# Patient Record
Sex: Female | Born: 1990 | Race: Black or African American | Hispanic: No | Marital: Single | Smoking: Never smoker
Health system: Southern US, Community
[De-identification: ages and names within clinical notes are randomized; demographics above are authoritative.]

## PROBLEM LIST (undated history)

## (undated) DIAGNOSIS — R079 Chest pain, unspecified: Secondary | ICD-10-CM

## (undated) DIAGNOSIS — R0602 Shortness of breath: Secondary | ICD-10-CM

## (undated) DIAGNOSIS — E119 Type 2 diabetes mellitus without complications: Secondary | ICD-10-CM

## (undated) DIAGNOSIS — G4730 Sleep apnea, unspecified: Secondary | ICD-10-CM

## (undated) DIAGNOSIS — J45909 Unspecified asthma, uncomplicated: Secondary | ICD-10-CM

## (undated) DIAGNOSIS — M549 Dorsalgia, unspecified: Secondary | ICD-10-CM

## (undated) DIAGNOSIS — F32A Depression, unspecified: Secondary | ICD-10-CM

## (undated) DIAGNOSIS — F419 Anxiety disorder, unspecified: Secondary | ICD-10-CM

## (undated) DIAGNOSIS — R600 Localized edema: Secondary | ICD-10-CM

## (undated) DIAGNOSIS — I10 Essential (primary) hypertension: Secondary | ICD-10-CM

## (undated) DIAGNOSIS — I313 Pericardial effusion (noninflammatory): Secondary | ICD-10-CM

## (undated) DIAGNOSIS — I3139 Other pericardial effusion (noninflammatory): Secondary | ICD-10-CM

## (undated) DIAGNOSIS — F329 Major depressive disorder, single episode, unspecified: Secondary | ICD-10-CM

## (undated) HISTORY — DX: Dorsalgia, unspecified: M54.9

## (undated) HISTORY — DX: Anxiety disorder, unspecified: F41.9

## (undated) HISTORY — DX: Localized edema: R60.0

## (undated) HISTORY — DX: Sleep apnea, unspecified: G47.30

## (undated) HISTORY — DX: Depression, unspecified: F32.A

## (undated) HISTORY — DX: Chest pain, unspecified: R07.9

## (undated) HISTORY — DX: Shortness of breath: R06.02

---
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
## 2016-10-13 DIAGNOSIS — I10 Essential (primary) hypertension: Secondary | ICD-10-CM | POA: Insufficient documentation

## 2016-10-13 DIAGNOSIS — F418 Other specified anxiety disorders: Secondary | ICD-10-CM | POA: Insufficient documentation

## 2016-10-13 DIAGNOSIS — O1420 HELLP syndrome (HELLP), unspecified trimester: Secondary | ICD-10-CM | POA: Insufficient documentation

## 2016-10-18 ENCOUNTER — Encounter (HOSPITAL_COMMUNITY): Payer: Self-pay | Admitting: Emergency Medicine

## 2016-10-18 ENCOUNTER — Emergency Department (HOSPITAL_COMMUNITY)
Admission: EM | Admit: 2016-10-18 | Discharge: 2016-10-18 | Disposition: A | Discharge status: Home / Self Care | Payer: 59 | Attending: Emergency Medicine | Admitting: Emergency Medicine

## 2016-10-18 DIAGNOSIS — I10 Essential (primary) hypertension: Secondary | ICD-10-CM | POA: Diagnosis not present

## 2016-10-18 DIAGNOSIS — R51 Headache: Secondary | ICD-10-CM | POA: Diagnosis not present

## 2016-10-18 DIAGNOSIS — R519 Headache, unspecified: Secondary | ICD-10-CM

## 2016-10-18 DIAGNOSIS — Z79899 Other long term (current) drug therapy: Secondary | ICD-10-CM | POA: Diagnosis not present

## 2016-10-18 HISTORY — DX: Essential (primary) hypertension: I10

## 2016-10-18 LAB — I-STAT CHEM 8, ED
BUN: 8 mg/dL (ref 6–20)
CHLORIDE: 102 mmol/L (ref 101–111)
CREATININE: 0.9 mg/dL (ref 0.44–1.00)
Calcium, Ion: 1.11 mmol/L — ABNORMAL LOW (ref 1.15–1.40)
Glucose, Bld: 98 mg/dL (ref 65–99)
HEMATOCRIT: 42 % (ref 36.0–46.0)
Hemoglobin: 14.3 g/dL (ref 12.0–15.0)
POTASSIUM: 5 mmol/L (ref 3.5–5.1)
Sodium: 139 mmol/L (ref 135–145)
TCO2: 29 mmol/L (ref 0–100)

## 2016-10-18 MED ORDER — DIPHENHYDRAMINE HCL 50 MG/ML IJ SOLN
25.0000 mg | Freq: Once | INTRAMUSCULAR | Status: AC
  Administered 2016-10-18: 25 mg via INTRAVENOUS
  Filled 2016-10-18: qty 1

## 2016-10-18 MED ORDER — METOCLOPRAMIDE HCL 5 MG/ML IJ SOLN
10.0000 mg | Freq: Once | INTRAMUSCULAR | Status: AC
  Administered 2016-10-18: 10 mg via INTRAVENOUS
  Filled 2016-10-18: qty 2

## 2016-10-18 NOTE — ED Provider Notes (Signed)
[facility]-EMERGENCY DEPT Provider Note   CSN: [id number] Arrival date & time: 10/18/16  [id number]     History   Chief Complaint Chief Complaint  Patient presents with  . Hypertension  . Headache    HPI Kimberly Heath is a 25 y.o. female.  HPI  25 year old female with a history of hypertension since she was 25 years old presents with a headache and hypertension. Patient states she gets headaches similar to this but not as bad about 3 or 4 times a week. It is a squeezing sensation bitemporally and frontally. The location and type of headache are not different from her chronic headaches but worse this morning. This headache started last night and was about a 3 out of 10, typical for her. It has gradually worsened and is now around a 10/10. There was never an acute worsening. There is some photophobia but denies dizziness, nausea, vomiting. No neck pain or stiffness. No weakness or numbness. No chest pain. Checked her blood pressure this morning and it was 190/100 in 1 arm and 170 systolic and the other. She is on 3 different antihypertensives but states that she takes them sporadically. Sometimes she will take 2 or 1 or all 3. Patient states typically when she goes to the doctor her blood pressure is around 160 systolic. She normally only checks it at home whenever she's having a bad headache. She has had a headache similar to this with significant hypertension in the past. Usually takes ibuprofen for her headache but has not taken any this morning.  Past Medical History:  Diagnosis Date  . Hypertension     There are no active problems to display for this patient.   History reviewed. No pertinent surgical history.  OB History    No data available       Home Medications    Prior to Admission medications   Medication Sig Start Date End Date Taking? Authorizing Provider  amLODipine (NORVASC) 10 MG tablet Take 10 mg by mouth daily. 09/02/16  Yes Historical Provider, MD  carvedilol (COREG)  25 MG tablet Take 25 mg by mouth 2 (two) times daily with a meal.   Yes Historical Provider, MD  cyclobenzaprine (FLEXERIL) 10 MG tablet Take 10 mg by mouth daily as needed for muscle spasms. 09/02/16  Yes Historical Provider, MD  diclofenac (VOLTAREN) 75 MG EC tablet Take 75 mg by mouth daily as needed for pain. 08/24/16  Yes Historical Provider, MD  hydrochlorothiazide (HYDRODIURIL) 25 MG tablet Take 25 mg by mouth daily.   Yes Historical Provider, MD  minocycline (DYNACIN) 50 MG tablet Take 50 mg by mouth 2 (two) times daily. 08/07/16  Yes Historical Provider, MD    Family History No family history on file.  Social History Social History  Substance Use Topics  . Smoking status: Never Smoker  . Smokeless tobacco: Never Used  . Alcohol use Yes     Allergies   Mushroom extract complex   Review of Systems Review of Systems  Constitutional: Negative for fever.  Eyes: Positive for photophobia.  Respiratory: Negative for shortness of breath.   Cardiovascular: Negative for chest pain.  Gastrointestinal: Negative for nausea and vomiting.  Neurological: Positive for headaches. Negative for dizziness, weakness and numbness.  All other systems reviewed and are negative.    Physical Exam Updated Vital Signs BP (!) 157/105   Pulse 79   Temp 98.3 F (36.8 C) (Oral)   Resp 18   Ht 5\' 3"  (1.6 m)  Wt 231 lb (104.8 kg)   LMP 10/03/2016   SpO2 100%   BMI 40.92 kg/m   Physical Exam  Constitutional: She is oriented to person, place, and time. She appears well-developed and well-nourished. No distress.  HENT:  Head: Normocephalic and atraumatic.  Right Ear: External ear normal.  Left Ear: External ear normal.  Nose: Nose normal.  Tenderness to bilateral temples  Eyes: EOM are normal. Pupils are equal, round, and reactive to light. Right eye exhibits no discharge. Left eye exhibits no discharge.  Neck: Normal range of motion. Neck supple.  No meningismus  Cardiovascular: Normal  rate, regular rhythm and normal heart sounds.   Pulmonary/Chest: Effort normal and breath sounds normal.  Abdominal: Soft. There is no tenderness.  Neurological: She is alert and oriented to person, place, and time.  CN 3-12 grossly intact. 5/5 strength in all 4 extremities. Grossly normal sensation. Normal finger to nose.   Skin: Skin is warm and dry. She is not diaphoretic.  Nursing note and vitals reviewed.    ED Treatments / Results  Labs (all labs ordered are listed, but only abnormal results are displayed) Labs Reviewed  I-STAT CHEM 8, ED - Abnormal; Notable for the following:       Result Value   Calcium, Ion 1.11 (*)    All other components within normal limits    EKG  EKG Interpretation None       Radiology No results found.  Procedures Procedures (including critical care time)  Medications Ordered in ED Medications  metoCLOPramide (REGLAN) injection 10 mg (10 mg Intravenous Given 10/18/16 0836)  diphenhydrAMINE (BENADRYL) injection 25 mg (25 mg Intravenous Given 10/18/16 0836)     Initial Impression / Assessment and Plan / ED Course  I have reviewed the triage vital signs and the nursing notes.  Pertinent labs & imaging results that were available during my care of the patient were reviewed by me and considered in my medical decision making (see chart for details).  Clinical Course as of Oct 18 958  Tue Oct 18, 2016  [id number] Neuro exam is unremarkable. I'm finding no focal signs or symptoms to suggest needing emergent imaging or lumbar puncture. This is likely an acute exacerbation of what seems to be chronic headaches. Probably related to her hypertension. Given it is so poorly controlled, will check renal function and electrolytes and treat headache.  [SG]  0956 Patient's headache is much. Her history panel is unremarkable. I have counseled her on the need for better blood pressure control at home and potential downstream consequences of uncontrolled  hypertension. My suspicion for subarachnoid hemorrhage, meningitis, stroke, or other acute CNS emergency is quite low. Follow-up with her PCP.  [SG]    Clinical Course User Index [SG] Pricilla LovelessScott Trystyn Sitts, MD    Final Clinical Impressions(s) / ED Diagnoses   Final diagnoses:  Frontal headache  Hypertension, unspecified type    New Prescriptions New Prescriptions   No medications on file     Pricilla LovelessScott Brittni Hult, MD 10/18/16 [phone]

## 2016-10-18 NOTE — ED Triage Notes (Signed)
Patient states that she has HTN but hasnt been taking her medications as prescribed. Patient felt like her BP was high this morning due to headache, eye pressure so she checked it and bilat arms and was elevated 190/100s.

## 2016-12-01 ENCOUNTER — Encounter: Payer: 59 | Attending: Obstetrics and Gynecology | Admitting: Dietician

## 2016-12-01 DIAGNOSIS — Z713 Dietary counseling and surveillance: Secondary | ICD-10-CM | POA: Diagnosis present

## 2016-12-01 DIAGNOSIS — E669 Obesity, unspecified: Secondary | ICD-10-CM | POA: Insufficient documentation

## 2016-12-01 NOTE — Progress Notes (Signed)
  Medical Nutrition Therapy:  Appt start time: 1640 end time:  1715.   Assessment:  Primary concerns today: Kimberly Heath is here today since she would like to lose weight and lower her blood pressure. She has a history of depression which got worse in October 2015 when she had a stillborn baby. Birth control pill have also triggered depression. Sees a therapist regularly which is helping with her depression. Not on medication for depression.   States she has episodes of binge eating especially when she is depressed. Will very little one day and nonstop the next day (every other day eats nonstop). Has discussed this with her therapist. Does not use laxatives or vomiting to try to lose weight, though has in the past. Started binging since age 26.  Works at [employer] working with [employer] regular business hours. Lives with her sister, her sister's boyfriend, and her niece. Moved to area from [place] a year ago.   Preferred Learning Style:   No preference indicated   Learning Readiness:   Ready  MEDICATIONS: see list   DIETARY INTAKE:  Avoided foods include: mushrooms, pasta   24-hr recall:  oatmeal or 2 boiled eggs with coffee or soda on restrictive days  fries and fried chicken, wings or pizza throughout the day on binge days  Beverages: water, coffee with cream and sugar, 1 soda   Usual physical activity: walking for 30 minutes every other day    Nutritional Diagnosis:  NB-1.5 Disordered eating pattern As related to hx of regularly restricting food intake and then binging.  As evidenced by diet recall/patient report.    Intervention:  Nutrition counseling provded. Discussed having patient develop a healthy relationship with food which would look like a more structured meal pattern and eating according to hunger and fullness feelings. Recommended that she follow up with Kimberly EdgeLaura Heath, RD to help with normalizing meal pattern.   Teaching Method Utilized:   Visual Auditory Hands on  Handouts given during visit include:  none  Barriers to learning/adherence to lifestyle change: depression  Demonstrated degree of understanding via:  Teach Back   Monitoring/Evaluation:  Dietary intake, exercise, and body weight in 2 week(s).

## 2016-12-01 NOTE — Patient Instructions (Signed)
Start to pay attention to hunger and fullness cues. Eat when you are starting to get hungry and stop eating when you are no longer hungry. Work on eating 3 times per day each day.  Have a snack if you are hungry.

## 2016-12-22 ENCOUNTER — Encounter: Payer: 59 | Admitting: *Deleted

## 2016-12-22 DIAGNOSIS — R632 Polyphagia: Secondary | ICD-10-CM

## 2016-12-22 DIAGNOSIS — Z713 Dietary counseling and surveillance: Secondary | ICD-10-CM | POA: Diagnosis not present

## 2016-12-22 NOTE — Progress Notes (Signed)
  Medical Nutrition Therapy:  Appt start time: 1600 end time:  1645.   Assessment:  Primary concerns today: Wants to lose weight.  Saw Pervis HockingLiz Schonthal and told her about binge behaviors. lives with sister and sister's bf and their baby.  [name] tries to [employer] recipes that she sees online.  They do not eat together.  Sister will get food out or cook and Panamajessica doesn't like their food.   She works 5 days/week and she brings lunch.  Does go out a couple times a month.  Gets fast food some too. She likes to cook.   When at home she eats at the dining room table.  Sometimes she eats with her sister and sometimes by herself.  She eats while distracted.  She can eat slowly.    Has attempted to lose weight though shakes apple cider vinegar/lemon Liquid diets  As always been larger, but weight has been increasing over the years.  Started dieting in middle school and then started again a couple years ago.  Preferred Learning Style:   No preference indicated   Learning Readiness:   Ready  Change in progress   MEDICATIONS: see list   DIETARY INTAKE:  Usual eating pattern includes 3 meals and 3 snacks per day.  voided foods include mushrooms (allergy).    24-hr recall:  B ( AM): PB crackers .  Normally oatmeal Snk ( AM): none  L ( PM): none.  Normally rice and chicken or chicken with vegetables and apple Snk ( PM): nond D ( PM): hibachachi Snk ( PM): none Beverages: coffee, water  Usual physical activity: walk 30 minutes most days     Nutritional Diagnosis:  NB-1.5 Disordered eating pattern As related to meal skipping and binge eating.  As evidenced by self-report.    Intervention:  Nutrition counseling provided.  Discussed HAES principles and how dieting is ineffective and promotes disordered eating behaviors like binging.  This provider can facilitate an improved relationship with food, but not weight loss simultaneously.  [name] interested in healing relationship with food.   Discussed balanced eating and not restricting.  Goal is for 3 meals/day with variety.    Teaching Method Utilized:  Visual Auditory     Monitoring/Evaluation:  Dietary intake, exercise, in 1 month(s).

## 2017-01-26 ENCOUNTER — Ambulatory Visit: Payer: 59 | Admitting: *Deleted

## 2017-03-03 ENCOUNTER — Emergency Department (HOSPITAL_BASED_OUTPATIENT_CLINIC_OR_DEPARTMENT_OTHER): Payer: 59

## 2017-03-03 ENCOUNTER — Emergency Department (HOSPITAL_BASED_OUTPATIENT_CLINIC_OR_DEPARTMENT_OTHER)
Admission: EM | Admit: 2017-03-03 | Discharge: 2017-03-03 | Disposition: A | Discharge status: Home / Self Care | Payer: 59 | Attending: Physician Assistant | Admitting: Physician Assistant

## 2017-03-03 ENCOUNTER — Encounter (HOSPITAL_BASED_OUTPATIENT_CLINIC_OR_DEPARTMENT_OTHER): Payer: Self-pay

## 2017-03-03 DIAGNOSIS — R1013 Epigastric pain: Secondary | ICD-10-CM | POA: Diagnosis not present

## 2017-03-03 DIAGNOSIS — R10811 Right upper quadrant abdominal tenderness: Secondary | ICD-10-CM

## 2017-03-03 DIAGNOSIS — I10 Essential (primary) hypertension: Secondary | ICD-10-CM | POA: Insufficient documentation

## 2017-03-03 DIAGNOSIS — Z79899 Other long term (current) drug therapy: Secondary | ICD-10-CM | POA: Insufficient documentation

## 2017-03-03 DIAGNOSIS — R1011 Right upper quadrant pain: Secondary | ICD-10-CM | POA: Insufficient documentation

## 2017-03-03 MED ORDER — SUCRALFATE 1 G PO TABS: 1.0000 g | ORAL_TABLET | Freq: Three times a day (TID) | ORAL | 0 refills | Status: DC

## 2017-03-03 MED ORDER — ACETAMINOPHEN 325 MG PO TABS
650.0000 mg | ORAL_TABLET | Freq: Once | ORAL | Status: AC
  Administered 2017-03-03: 650 mg via ORAL
  Filled 2017-03-03: qty 2

## 2017-03-03 MED FILL — SUCRALFATE 1 GM TABLET: 1 | 7 days supply | Qty: 30 | Fill #0

## 2017-03-03 NOTE — ED Triage Notes (Signed)
Pt c/o intermittent, sharp, upper abdominal pain for the last several days that comes in waves.  She denies n/v/d, denies radiating pain to the back, denies fevers.  Pt saw PCP yesterday and had labs drawn, was told to take nexium as well.  Pt was not informed that the nexium may take several days to become effective.

## 2017-03-03 NOTE — ED Provider Notes (Signed)
[facility]   CSN: [id number] Arrival date & time: 03/03/17  6962     History   Chief Complaint Chief Complaint  Patient presents with  . Abdominal Pain    HPI CHENIKA NEVILS is a 26 y.o. female.  HPI   Patient is a 26 year old female presenting with right upper quadrant pain. Patient reports epigastric and right upper quadrant pain for last 5 days. She reports it's a gnawing pain. The pain sometimes sharp and goes through her abdomen. Patient does not have significant nausea. It is not associated with eating. Patient is given a prescription for Nexium but is only taken one today.  Past Medical History:  Diagnosis Date  . Hypertension     There are no active problems to display for this patient.   History reviewed. No pertinent surgical history.  OB History    No data available       Home Medications    Prior to Admission medications   Medication Sig Start Date End Date Taking? Authorizing Provider  amLODipine (NORVASC) 10 MG tablet Take 10 mg by mouth daily. 09/02/16   Historical Provider, MD  carvedilol (COREG) 25 MG tablet Take 25 mg by mouth 2 (two) times daily with a meal.    Historical Provider, MD  cyclobenzaprine (FLEXERIL) 10 MG tablet Take 10 mg by mouth daily as needed for muscle spasms. 09/02/16   Historical Provider, MD  diclofenac (VOLTAREN) 75 MG EC tablet Take 75 mg by mouth daily as needed for pain. 08/24/16   Historical Provider, MD  hydrochlorothiazide (HYDRODIURIL) 25 MG tablet Take 25 mg by mouth daily.    Historical Provider, MD  minocycline (DYNACIN) 50 MG tablet Take 50 mg by mouth 2 (two) times daily. 08/07/16   Historical Provider, MD    Family History No family history on file.  Social History Social History  Substance Use Topics  . Smoking status: Never Smoker  . Smokeless tobacco: Never Used  . Alcohol use Yes     Allergies   Mushroom extract complex   Review of Systems Review of Systems    Constitutional: Negative for activity change, fatigue and fever.  Respiratory: Negative for shortness of breath.   Cardiovascular: Negative for chest pain.  Gastrointestinal: Positive for abdominal pain. Negative for constipation, diarrhea, nausea and vomiting.  All other systems reviewed and are negative.    Physical Exam Updated Vital Signs BP (!) 126/55 (BP Location: Right Arm)   Pulse 81   Temp 98.2 F (36.8 C) (Oral)   Resp 18   Ht  (1.575 m)   Wt 232 lb (105.2 kg)   LMP 02/25/2017   SpO2 99%   BMI 42.43 kg/m   Physical Exam  Constitutional: She is oriented to person, place, and time. She appears well-developed and well-nourished.  HENT:  Head: Normocephalic and atraumatic.  Eyes: Right eye exhibits no discharge.  Cardiovascular: Normal rate, regular rhythm and normal heart sounds.   No murmur heard. Pulmonary/Chest: Effort normal and breath sounds normal. She has no wheezes. She has no rales.  Abdominal: Soft. She exhibits no distension. There is tenderness.  Right upper quadrant tenderness on exam.  Neurological: She is oriented to person, place, and time.  Skin: Skin is warm and dry. She is not diaphoretic.  Psychiatric: She has a normal mood and affect.  Nursing note and vitals reviewed.    ED Treatments / Results  Labs (all labs ordered are listed, but only abnormal results  are displayed) Labs Reviewed  COMPREHENSIVE METABOLIC PANEL  CBC WITH DIFFERENTIAL/PLATELET  LIPASE, BLOOD    EKG  EKG Interpretation None       Radiology No results found.  Procedures Procedures (including critical care time)  Medications Ordered in ED Medications  acetaminophen (TYLENOL) tablet 650 mg (not administered)     Initial Impression / Assessment and Plan / ED Course  I have reviewed the triage vital signs and the nursing notes.  Pertinent labs & imaging results that were available during my care of the patient were reviewed by me and considered in  my medical decision making (see chart for details).    All pain 26 year old female presenting with right upper quadrant pain. Patient being started on treatment for gastritis versus PUD yesterday. However patient has significant tenderness right upper quadrant exam. Concern for cholelithiasis. We'll get ultrasound, and then have patient continue to take PUD treatment and follow-up with her primary care physician.  [place] neg. Pt taking PO, will discharge with folllw up and return precautions.   Final Clinical Impressions(s) / ED Diagnoses   Final diagnoses:  RUQ pain    New Prescriptions New Prescriptions   No medications on file     Kateria Cutrona Randall An, MD 03/03/17 1533

## 2017-03-03 NOTE — ED Notes (Signed)
ED Provider at bedside. 

## 2017-03-03 NOTE — Discharge Instructions (Signed)
You were seen today for pain in your epigastric area. We will need to continue the prescription that was given to you by your primary care physician. If you take this daily should decrease the amount of acid in your stomach and allow your stomach to heal. Addition we will needed use a bland diet. We will also give you aa pill here that you can take to coat stomach and help your symptoms. Please follow-up with a GI physician in 2 weeks if you're not improving.

## 2017-05-02 ENCOUNTER — Encounter (HOSPITAL_BASED_OUTPATIENT_CLINIC_OR_DEPARTMENT_OTHER): Payer: Self-pay | Admitting: *Deleted

## 2017-05-02 ENCOUNTER — Emergency Department (HOSPITAL_BASED_OUTPATIENT_CLINIC_OR_DEPARTMENT_OTHER)
Admission: EM | Admit: 2017-05-02 | Discharge: 2017-05-02 | Disposition: A | Discharge status: Home / Self Care | Payer: 59 | Attending: Physician Assistant | Admitting: Physician Assistant

## 2017-05-02 ENCOUNTER — Emergency Department (HOSPITAL_BASED_OUTPATIENT_CLINIC_OR_DEPARTMENT_OTHER): Payer: 59

## 2017-05-02 DIAGNOSIS — R0602 Shortness of breath: Secondary | ICD-10-CM | POA: Insufficient documentation

## 2017-05-02 DIAGNOSIS — R079 Chest pain, unspecified: Secondary | ICD-10-CM | POA: Diagnosis present

## 2017-05-02 DIAGNOSIS — Z79899 Other long term (current) drug therapy: Secondary | ICD-10-CM | POA: Insufficient documentation

## 2017-05-02 DIAGNOSIS — I10 Essential (primary) hypertension: Secondary | ICD-10-CM | POA: Diagnosis not present

## 2017-05-02 DIAGNOSIS — R0782 Intercostal pain: Secondary | ICD-10-CM | POA: Diagnosis not present

## 2017-05-02 LAB — CBC WITH DIFFERENTIAL/PLATELET
BASOS PCT: 0 %
Basophils Absolute: 0 10*3/uL (ref 0.0–0.1)
EOS ABS: 0.1 10*3/uL (ref 0.0–0.7)
Eosinophils Relative: 1 %
HEMATOCRIT: 35.1 % — AB (ref 36.0–46.0)
HEMOGLOBIN: 12.3 g/dL (ref 12.0–15.0)
Lymphocytes Relative: 32 %
Lymphs Abs: 2 10*3/uL (ref 0.7–4.0)
MCH: 26.2 pg (ref 26.0–34.0)
MCHC: 35 g/dL (ref 30.0–36.0)
MCV: 74.7 fL — ABNORMAL LOW (ref 78.0–100.0)
Monocytes Absolute: 0.8 10*3/uL (ref 0.1–1.0)
Monocytes Relative: 13 %
NEUTROS ABS: 3.4 10*3/uL (ref 1.7–7.7)
NEUTROS PCT: 54 %
Platelets: 195 10*3/uL (ref 150–400)
RBC: 4.7 MIL/uL (ref 3.87–5.11)
RDW: 15.6 % — ABNORMAL HIGH (ref 11.5–15.5)
WBC: 6.2 10*3/uL (ref 4.0–10.5)

## 2017-05-02 LAB — COMPREHENSIVE METABOLIC PANEL
ALBUMIN: 4.1 g/dL (ref 3.5–5.0)
ALK PHOS: 63 U/L (ref 38–126)
ALT: 23 U/L (ref 14–54)
ANION GAP: 8 (ref 5–15)
AST: 23 U/L (ref 15–41)
BILIRUBIN TOTAL: 0.6 mg/dL (ref 0.3–1.2)
BUN: 10 mg/dL (ref 6–20)
CALCIUM: 9.2 mg/dL (ref 8.9–10.3)
CO2: 29 mmol/L (ref 22–32)
CREATININE: 0.82 mg/dL (ref 0.44–1.00)
Chloride: 101 mmol/L (ref 101–111)
GFR calc Af Amer: >60 mL/min (ref >60)
GFR calc non Af Amer: >60 mL/min (ref >60)
GLUCOSE: 99 mg/dL (ref 65–99)
Potassium: 3.8 mmol/L (ref 3.5–5.1)
SODIUM: 138 mmol/L (ref 135–145)
Total Protein: 7.7 g/dL (ref 6.5–8.1)

## 2017-05-02 LAB — PREGNANCY, URINE: Preg Test, Ur: NEGATIVE

## 2017-05-02 LAB — TROPONIN I: Troponin I: <0.03 ng/mL (ref <0.03)

## 2017-05-02 NOTE — ED Triage Notes (Addendum)
Chest pain that feels like a brick is wrapped around her heart. Swelling in her legs. She feels like the left side of her face is swollen. Symptoms started 3 days ago. Worse today with SOB.

## 2017-05-02 NOTE — ED Provider Notes (Signed)
[facility]   CSN: [id number] Arrival date & time: 05/02/17  1949  By signing my name below, I, Diona BrownerJennifer Gorman, attest that this documentation has been prepared under the direction and in the presence of Mackuen, Cindee Saltourteney Lyn, MD. Electronically Signed: Diona BrownerJennifer Gorman, ED Scribe. 05/02/17. 9:06 PM.  History   Chief Complaint Chief Complaint  Patient presents with  . Chest Pain    HPI Kimberly Heath is a 26 y.o. female who presents to the Emergency Department complaining of constant CP that stared last week ~ 04/28/17. Associated sx include bilateral leg edema and SOB. Doesn't come and go. Not asoociated with eating. Pain is exacerbated with deep breathes. No recent travel. Doesn't take estrogen. Pt denies cough, fever, and burning in her throat.  The history is provided by the patient. No language interpreter was used.    Past Medical History:  Diagnosis Date  . Hypertension     There are no active problems to display for this patient.   History reviewed. No pertinent surgical history.  OB History    No data available       Home Medications    Prior to Admission medications   Medication Sig Start Date End Date Taking? Authorizing Provider  amLODipine (NORVASC) 10 MG tablet Take 10 mg by mouth daily. 09/02/16  Yes [provider]  carvedilol (COREG) 25 MG tablet Take 25 mg by mouth 2 (two) times daily with a meal.   Yes [provider]  cyclobenzaprine (FLEXERIL) 10 MG tablet Take 10 mg by mouth daily as needed for muscle spasms. 09/02/16  Yes [provider]  diclofenac (VOLTAREN) 75 MG EC tablet Take 75 mg by mouth daily as needed for pain. 08/24/16  Yes [provider]  hydrochlorothiazide (HYDRODIURIL) 25 MG tablet Take 25 mg by mouth daily.   Yes [provider]  minocycline (DYNACIN) 50 MG tablet Take 50 mg by mouth 2 (two) times daily. 08/07/16  Yes [provider]  sucralfate (CARAFATE)  1 g tablet Take 1 tablet (1 g total) by mouth 4 (four) times daily -  with meals and at bedtime. 03/03/17  Yes Mackuen, Cindee Saltourteney Lyn, MD    Family History No family history on file.  Social History Social History  Substance Use Topics  . Smoking status: Never Smoker  . Smokeless tobacco: Never Used  . Alcohol use Yes     Allergies   Mushroom extract complex   Review of Systems Review of Systems  Constitutional: Negative for fever.  Respiratory: Positive for shortness of breath. Negative for cough.   Cardiovascular: Positive for chest pain.  All other systems reviewed and are negative.    Physical Exam Updated Vital Signs BP 127/72   Pulse 79   Temp 98.4 F (36.9 C) (Oral)   Resp 19   Ht 5\' 2"  (1.575 m)   Wt 232 lb (105.2 kg)   LMP 04/13/2017   SpO2 100%   BMI 42.43 kg/m   Physical Exam  Constitutional: She is oriented to person, place, and time. She appears well-developed and well-nourished.  HENT:  Head: Normocephalic.  Eyes: EOM are normal.  Neck: Normal range of motion.  Pulmonary/Chest: Effort normal.  Abdominal: She exhibits no distension.  Musculoskeletal: Normal range of motion. She exhibits no edema.  No real edema bileateral LE  Neurological: She is alert and oriented to person, place, and time.  Psychiatric: She has a normal mood and affect.  Nursing note and vitals  reviewed.    ED Treatments / Results  DIAGNOSTIC STUDIES: Oxygen Saturation is 100% on RA, normal by my interpretation.   COORDINATION OF CARE: 9:06 PM-Discussed next steps with pt which includes a CXR and getting compression sucks for bilateral leg edema. Pt verbalized understanding and is agreeable with the plan.    Labs (all labs ordered are listed, but only abnormal results are displayed) Labs Reviewed  CBC WITH DIFFERENTIAL/PLATELET - Abnormal; Notable for the following:       Result Value   HCT 35.1 (*)    MCV 74.7 (*)    RDW 15.6 (*)    All other components within  normal limits  PREGNANCY, URINE  COMPREHENSIVE METABOLIC PANEL  TROPONIN I    EKG  EKG Interpretation  Date/Time:  Tuesday May 02 2017 19:57:49 EDT Ventricular Rate:  72 PR Interval:  126 QRS Duration: 90 QT Interval:  406 QTC Calculation: 444 R Axis:   83 Text Interpretation:  Normal sinus rhythm Normal ECG Normal sinus rhythm Confirmed by Corlis Leak, Courteney ([id number]) on 05/02/2017 9:11:41 PM       Radiology No results found.  Procedures Procedures (including critical care time)  Medications Ordered in ED Medications - No data to display   Initial Impression / Assessment and Plan / ED Course  I have reviewed the triage vital signs and the nursing notes.  Pertinent labs & imaging results that were available during my care of the patient were reviewed by me and considered in my medical decision making (see chart for details).      I personally performed the services described in this documentation, which was scribed in my presence. The recorded information has been reviewed and is accurate.   Well-appearing 26 year old female presenting with chest pain. No risk factors for PE. Patient's physical exam is reassuring. Will get chest x-ray, baseline labs. Likely costochondritis or muscle srain. Does not sound typical for PE, ischemic heart disease, pericarditis, or other pathology. No swelling to her face or feet on PE.  Appears well, NAD.  10:26 PM Labs and x-ray are reassuring. We'll have patient follow up with primary care physician.  Patient is comfortable, ambulatory, and taking PO at time of discharge.  Patient expressed understanding about return precautions.     Final Clinical Impressions(s) / ED Diagnoses   Final diagnoses:  None    New Prescriptions New Prescriptions   No medications on file     Abelino Derrick, MD 05/02/17 2227

## 2017-05-02 NOTE — Discharge Instructions (Signed)
We are unsure what is causing your chest pain. However your labs and x-ray were reassuring. Please follow-up with her primary care physician.

## 2017-07-12 ENCOUNTER — Encounter (HOSPITAL_BASED_OUTPATIENT_CLINIC_OR_DEPARTMENT_OTHER): Payer: Self-pay

## 2017-07-12 ENCOUNTER — Emergency Department (HOSPITAL_BASED_OUTPATIENT_CLINIC_OR_DEPARTMENT_OTHER)
Admission: EM | Admit: 2017-07-12 | Discharge: 2017-07-12 | Disposition: A | Discharge status: Home / Self Care | Payer: 59 | Attending: Emergency Medicine | Admitting: Emergency Medicine

## 2017-07-12 ENCOUNTER — Emergency Department (HOSPITAL_BASED_OUTPATIENT_CLINIC_OR_DEPARTMENT_OTHER): Payer: 59

## 2017-07-12 DIAGNOSIS — Z79899 Other long term (current) drug therapy: Secondary | ICD-10-CM | POA: Insufficient documentation

## 2017-07-12 DIAGNOSIS — R079 Chest pain, unspecified: Secondary | ICD-10-CM | POA: Diagnosis present

## 2017-07-12 DIAGNOSIS — R0789 Other chest pain: Secondary | ICD-10-CM | POA: Diagnosis not present

## 2017-07-12 DIAGNOSIS — I10 Essential (primary) hypertension: Secondary | ICD-10-CM | POA: Insufficient documentation

## 2017-07-12 LAB — CBC
HEMATOCRIT: 34.3 % — AB (ref 36.0–46.0)
HEMOGLOBIN: 12 g/dL (ref 12.0–15.0)
MCH: 26.4 pg (ref 26.0–34.0)
MCHC: 35 g/dL (ref 30.0–36.0)
MCV: 75.4 fL — ABNORMAL LOW (ref 78.0–100.0)
Platelets: 184 10*3/uL (ref 150–400)
RBC: 4.55 MIL/uL (ref 3.87–5.11)
RDW: 16.5 % — ABNORMAL HIGH (ref 11.5–15.5)
WBC: 5.2 10*3/uL (ref 4.0–10.5)

## 2017-07-12 LAB — PREGNANCY, URINE: PREG TEST UR: NEGATIVE

## 2017-07-12 LAB — BRAIN NATRIURETIC PEPTIDE: B Natriuretic Peptide: 41.3 pg/mL (ref 0.0–100.0)

## 2017-07-12 LAB — BASIC METABOLIC PANEL
Anion gap: 7 (ref 5–15)
BUN: 9 mg/dL (ref 6–20)
CHLORIDE: 103 mmol/L (ref 101–111)
CO2: 28 mmol/L (ref 22–32)
CREATININE: 0.99 mg/dL (ref 0.44–1.00)
Calcium: 8.9 mg/dL (ref 8.9–10.3)
GFR calc non Af Amer: >60 mL/min (ref >60)
Glucose, Bld: 98 mg/dL (ref 65–99)
POTASSIUM: 3.8 mmol/L (ref 3.5–5.1)
Sodium: 138 mmol/L (ref 135–145)

## 2017-07-12 LAB — TROPONIN I: Troponin I: <0.03 ng/mL (ref <0.03)

## 2017-07-12 NOTE — ED Notes (Signed)
ED Provider at bedside. 

## 2017-07-12 NOTE — ED Provider Notes (Signed)
[facility] Provider Note   CSN: [id number] Arrival date & time: 07/12/17  1601     History   Chief Complaint Chief Complaint  Patient presents with  . Chest Pain    HPI Kimberly GongJessica K Heath is a 26 y.o. female.  HPI  Pt presenting with c/o chest pain.  She has hx of hypertension and was recently started on lasix due to lower extremity swelling- had negative DVT study and negative CT angio chest approx 2 weeks ago- she states she has a cardiology followup scheduled next week.  She states the pain in her chest is sharp like a needle sticking into her left anterior chest.  She also describes continued bilateral lower extremity swelling- she states it is somewhat improved on lasix but not significantly improved.  No cough, no fever. No hx of DVT/PE, no recent travel/trauma/surgery. There are no other associated systemic symptoms, there are no other alleviating or modifying factors.   Past Medical History:  Diagnosis Date  . Hypertension     There are no active problems to display for this patient.   History reviewed. No pertinent surgical history.  OB History    No data available       Home Medications    Prior to Admission medications   Medication Sig Start Date End Date Taking? Authorizing Provider  amLODipine (NORVASC) 10 MG tablet Take 10 mg by mouth daily. 09/02/16   [provider]  carvedilol (COREG) 25 MG tablet Take 25 mg by mouth 2 (two) times daily with a meal.    [provider]  cyclobenzaprine (FLEXERIL) 10 MG tablet Take 10 mg by mouth daily as needed for muscle spasms. 09/02/16   [provider]  diclofenac (VOLTAREN) 75 MG EC tablet Take 75 mg by mouth daily as needed for pain. 08/24/16   [provider]  minocycline (DYNACIN) 50 MG tablet Take 50 mg by mouth 2 (two) times daily. 08/07/16   [provider]    Family History No family history on file.  Social History Social History  Substance Use  Topics  . Smoking status: Never Smoker  . Smokeless tobacco: Never Used  . Alcohol use Yes     Comment: occ     Allergies   Mushroom extract complex   Review of Systems Review of Systems  ROS reviewed and all otherwise negative except for mentioned in HPI   Physical Exam Updated Vital Signs BP (!) 149/94 (BP Location: Right Arm)   Pulse 79   Temp 99.1 F (37.3 C) (Oral)   Resp 16   LMP 07/02/2017   SpO2 100%  Vitals reviewed Physical Exam Physical Examination: General appearance - alert, well appearing, and in no distress Mental status - alert, oriented to person, place, and time Eyes - no conjunctival injection, no scleral icterus Chest - clear to auscultation, no wheezes, rales or rhonchi, symmetric air entry Heart - normal rate, regular rhythm, normal S1, S2, no murmurs, rubs, clicks or gallops Abdomen - soft, nontender, nondistended, no masses or organomegaly Neurological - alert, oriented, normal speech Extremities - peripheral pulses normal, mild nonpittinge pedal edema- symmetric, no clubbing or cyanosis Skin - normal coloration and turgor, no rashes  ED Treatments / Results  Labs (all labs ordered are listed, but only abnormal results are displayed) Labs Reviewed  CBC - Abnormal; Notable for the following:       Result Value   HCT 34.3 (*)    MCV 75.4 (*)  RDW 16.5 (*)    All other components within normal limits  BASIC METABOLIC PANEL  BRAIN NATRIURETIC PEPTIDE  PREGNANCY, URINE  TROPONIN I    EKG  EKG Interpretation  Date/Time:  Wednesday July 12 2017 16:10:08 EDT Ventricular Rate:  67 PR Interval:  130 QRS Duration: 84 QT Interval:  394 QTC Calculation: 416 R Axis:   57 Text Interpretation:  Normal sinus rhythm Nonspecific T wave abnormality Abnormal ECG No significant change since last tracing Confirmed by Jerelyn Scott [phone]) on 07/12/2017 4:13:51 PM       Radiology Dg Chest 2 View  Result Date: 07/12/2017 CLINICAL DATA:  Chest  pain and shortness of breath. EXAM: CHEST  2 VIEW COMPARISON:  Chest x-ray 05/02/2017 and chest CT 06/27/2017 FINDINGS: The cardiac silhouette is upper limits of normal in size. The mediastinal and hilar contours appear normal. The lungs are clear. No pleural effusion. The bony thorax is intact. IMPRESSION: No acute cardiopulmonary findings.  Stable borderline heart size. Electronically Signed   By: Rudie Meyer M.D.   On: 07/12/2017 16:42    Procedures Procedures (including critical care time)  Medications Ordered in ED Medications - No data to display   Initial Impression / Assessment and Plan / ED Course  I have reviewed the triage vital signs and the nursing notes.  Pertinent labs & imaging results that were available during my care of the patient were reviewed by me and considered in my medical decision making (see chart for details).     Pt presenting with chest pain- sharp and ongoing for the past 2 days.  She is currently being worked up due to bilateral leg swelling, cardiomegaly- has appointment scheduled with cardiology next week.  Doubt her chest pain represents ACS, PE or other acute process at this time.  Heart score of 1   She is PERC negative as well.  CXR shows cardiomegaly but no fluid overload, bnp is reassuring.  Negative troponin and reassuring EKG.  Discharged with strict return precautions.  Pt agreeable with plan.  Final Clinical Impressions(s) / ED Diagnoses   Final diagnoses:  Atypical chest pain    New Prescriptions Discharge Medication List as of 07/12/2017  6:17 PM       Cass Edinger, Latanya Maudlin, MD 07/12/17 2022

## 2017-07-12 NOTE — Discharge Instructions (Signed)
Return to the ED with any concerns including difficulty breathing, increased leg swelling, swelling of one leg more than the other, fainting, decreased level of alertness/lethargy, or any other alarming symptoms

## 2017-07-12 NOTE — ED Triage Notes (Addendum)
C/o intermittent CP x 1 week-ND-steady gait-was seen by PCP x 3 in the past month for bilat LE swelling-placed on new fluid pll and advised to stop HCTZ-had CT scan that showed "enlarged heart with fluid around the heart"-cards appt next week

## 2017-07-12 NOTE — ED Notes (Signed)
Patient transported to X-ray 

## 2017-07-28 ENCOUNTER — Encounter: Payer: Self-pay | Admitting: Family Medicine

## 2017-10-31 ENCOUNTER — Other Ambulatory Visit: Payer: Self-pay

## 2017-10-31 ENCOUNTER — Emergency Department (HOSPITAL_BASED_OUTPATIENT_CLINIC_OR_DEPARTMENT_OTHER)
Admission: EM | Admit: 2017-10-31 | Discharge: 2017-10-31 | Disposition: A | Discharge status: Home / Self Care | Payer: 59 | Attending: Emergency Medicine | Admitting: Emergency Medicine

## 2017-10-31 ENCOUNTER — Encounter (HOSPITAL_BASED_OUTPATIENT_CLINIC_OR_DEPARTMENT_OTHER): Payer: Self-pay

## 2017-10-31 DIAGNOSIS — Z79899 Other long term (current) drug therapy: Secondary | ICD-10-CM | POA: Insufficient documentation

## 2017-10-31 DIAGNOSIS — R002 Palpitations: Secondary | ICD-10-CM | POA: Diagnosis present

## 2017-10-31 DIAGNOSIS — R0789 Other chest pain: Secondary | ICD-10-CM

## 2017-10-31 DIAGNOSIS — I10 Essential (primary) hypertension: Secondary | ICD-10-CM | POA: Diagnosis not present

## 2017-10-31 HISTORY — DX: Pericardial effusion (noninflammatory): I31.3

## 2017-10-31 HISTORY — DX: Other pericardial effusion (noninflammatory): I31.39

## 2017-10-31 LAB — CBC WITH DIFFERENTIAL/PLATELET
Basophils Absolute: 0 10*3/uL (ref 0.0–0.1)
Basophils Relative: 0 %
EOS ABS: 0.1 10*3/uL (ref 0.0–0.7)
EOS PCT: 1 %
HCT: 33.5 % — ABNORMAL LOW (ref 36.0–46.0)
Hemoglobin: 11.7 g/dL — ABNORMAL LOW (ref 12.0–15.0)
LYMPHS ABS: 1.5 10*3/uL (ref 0.7–4.0)
LYMPHS PCT: 35 %
MCH: 26.4 pg (ref 26.0–34.0)
MCHC: 34.9 g/dL (ref 30.0–36.0)
MCV: 75.5 fL — AB (ref 78.0–100.0)
MONO ABS: 0.5 10*3/uL (ref 0.1–1.0)
Monocytes Relative: 11 %
Neutro Abs: 2.2 10*3/uL (ref 1.7–7.7)
Neutrophils Relative %: 53 %
PLATELETS: 171 10*3/uL (ref 150–400)
RBC: 4.44 MIL/uL (ref 3.87–5.11)
RDW: 15.6 % — AB (ref 11.5–15.5)
WBC: 4.2 10*3/uL (ref 4.0–10.5)

## 2017-10-31 LAB — BASIC METABOLIC PANEL
Anion gap: 7 (ref 5–15)
BUN: 9 mg/dL (ref 6–20)
CHLORIDE: 106 mmol/L (ref 101–111)
CO2: 24 mmol/L (ref 22–32)
CREATININE: 0.86 mg/dL (ref 0.44–1.00)
Calcium: 8.7 mg/dL — ABNORMAL LOW (ref 8.9–10.3)
GFR calc Af Amer: >60 mL/min (ref >60)
GLUCOSE: 111 mg/dL — AB (ref 65–99)
POTASSIUM: 3.6 mmol/L (ref 3.5–5.1)
SODIUM: 137 mmol/L (ref 135–145)

## 2017-10-31 LAB — TROPONIN I

## 2017-10-31 NOTE — ED Provider Notes (Signed)
[facility] EMERGENCY DEPARTMENT Provider Note   CSN: [id number] Arrival date & time: 10/31/17  0544     History   Chief Complaint Chief Complaint  Patient presents with  . Palpitations    HPI Kimberly Heath is a 26 y.o. female.  Patient is a 26 year old female with history of hypertension presenting for evaluation of heart palpitations.  She states that since yesterday evening her heart has been beating rapidly and forcefully.  She does report some chest discomfort that has been ongoing for some time.  She has been evaluated by cardiology in the past for similar complaints and was told she might have fluid around her heart, however no further testing was done.  She is to follow-up with them in the near future.  This evening she began with her heart pounding and wanted it evaluated.  She denies any difficulty breathing.  She denies any fevers or chills.  She denies any excessive caffeine or alcohol intake.   The history is provided by the patient.  Palpitations   This is a new problem. The current episode started yesterday. The problem occurs constantly. The problem has not changed since onset.Pertinent negatives include no diaphoresis, no fever, no orthopnea, no nausea and no lower extremity edema. She has tried nothing for the symptoms.    Past Medical History:  Diagnosis Date  . Hypertension   . Pericardial effusion     There are no active problems to display for this patient.   History reviewed. No pertinent surgical history.  OB History    No data available       Home Medications    Prior to Admission medications   Medication Sig Start Date End Date Taking? Authorizing Provider  cyclobenzaprine (FLEXERIL) 10 MG tablet Take 10 mg by mouth daily as needed for muscle spasms. 09/02/16  Yes [provider]  diclofenac (VOLTAREN) 75 MG EC tablet Take 75 mg by mouth daily as needed for pain. 08/24/16  Yes [provider]  hydrochlorothiazide  (HYDRODIURIL) 12.5 MG tablet Take 12.5 mg by mouth daily.   Yes [provider]  minocycline (DYNACIN) 50 MG tablet Take 50 mg by mouth 2 (two) times daily. 08/07/16  Yes [provider]  amLODipine (NORVASC) 10 MG tablet Take 10 mg by mouth daily. 09/02/16   [provider]  carvedilol (COREG) 25 MG tablet Take 25 mg by mouth 2 (two) times daily with a meal.    [provider]    Family History No family history on file.  Social History Social History   Tobacco Use  . Smoking status: Never Smoker  . Smokeless tobacco: Never Used  Substance Use Topics  . Alcohol use: Yes    Comment: occ  . Drug use: No     Allergies   Mushroom extract complex   Review of Systems Review of Systems  Constitutional: Negative for diaphoresis and fever.  Cardiovascular: Positive for palpitations. Negative for orthopnea.  Gastrointestinal: Negative for nausea.  All other systems reviewed and are negative.    Physical Exam Updated Vital Signs Ht 5\' 2"  (1.575 m)   Wt 105.2 kg (232 lb)   LMP 10/30/2017   BMI 42.43 kg/m   Physical Exam  Constitutional: She is oriented to person, place, and time. She appears well-developed and well-nourished. No distress.  HENT:  Head: Normocephalic and atraumatic.  Neck: Normal range of motion. Neck supple.  Cardiovascular: Normal rate and regular rhythm. Exam reveals no gallop and no  friction rub.  No murmur heard. Pulmonary/Chest: Effort normal and breath sounds normal. No respiratory distress. She has no wheezes.  Abdominal: Soft. Bowel sounds are normal. She exhibits no distension. There is no tenderness.  Musculoskeletal: Normal range of motion.  Neurological: She is alert and oriented to person, place, and time.  Skin: Skin is warm and dry. She is not diaphoretic.  Nursing note and vitals reviewed.    ED Treatments / Results  Labs (all labs ordered are listed, but only abnormal results are displayed) Labs  Reviewed  BASIC METABOLIC PANEL  CBC WITH DIFFERENTIAL/PLATELET  TROPONIN I    EKG  EKG Interpretation  Date/Time:  Tuesday October 31 2017 05:54:19 EST Ventricular Rate:  71 PR Interval:    QRS Duration: 92 QT Interval:  410 QTC Calculation: 446 R Axis:   74 Text Interpretation:  Sinus rhythm Normal ECG Confirmed by Geoffery LyonseLo, Artelia Game ([id number]) on 10/31/2017 6:01:40 AM       Radiology No results found.  Procedures Procedures (including critical care time)  Medications Ordered in ED Medications - No data to display   Initial Impression / Assessment and Plan / ED Course  I have reviewed the triage vital signs and the nursing notes.  Pertinent labs & imaging results that were available during my care of the patient were reviewed by me and considered in my medical decision making (see chart for details).  Patient presents here with complaints of chest discomfort and palpitations started yesterday.  Her symptoms are atypical for cardiac pain.  Her workup is unremarkable.  She has had no arrhythmias and has remained in a normal sinus rhythm throughout her emergency department stay.  At this point I do not feel as though any further workup is indicated.  I will have her follow-up with her primary doctor on Monday as scheduled and return to the ER in the meantime if symptoms worsen or change.  Final Clinical Impressions(s) / ED Diagnoses   Final diagnoses:  None    ED Discharge Orders    None       Geoffery Lyonselo, Khalid Lacko, MD 10/31/17 [phone]

## 2017-10-31 NOTE — ED Triage Notes (Addendum)
Pt reports "her heart is beating out of her chest" x 2 weeks, pt reports it got worse tonight and woke her up out of her sleep. Pt states she also has left sided chest pain onset this morning.

## 2017-10-31 NOTE — Discharge Instructions (Signed)
Continue your medications as before, and follow-up with your primary doctor on Monday as scheduled.

## 2017-10-31 NOTE — ED Notes (Signed)
ED Provider at bedside. 

## 2017-12-14 ENCOUNTER — Ambulatory Visit (INDEPENDENT_AMBULATORY_CARE_PROVIDER_SITE_OTHER): Payer: 59 | Admitting: Neurology

## 2017-12-14 ENCOUNTER — Encounter: Payer: Self-pay | Admitting: Neurology

## 2017-12-14 VITALS — BP 150/92 | HR 74 | Wt 235.5 lb

## 2017-12-14 DIAGNOSIS — I272 Pulmonary hypertension, unspecified: Secondary | ICD-10-CM

## 2017-12-14 DIAGNOSIS — D573 Sickle-cell trait: Secondary | ICD-10-CM

## 2017-12-14 DIAGNOSIS — R351 Nocturia: Secondary | ICD-10-CM

## 2017-12-14 DIAGNOSIS — Z6841 Body Mass Index (BMI) 40.0 and over, adult: Secondary | ICD-10-CM

## 2017-12-14 DIAGNOSIS — R0683 Snoring: Secondary | ICD-10-CM | POA: Diagnosis not present

## 2017-12-14 DIAGNOSIS — G4719 Other hypersomnia: Secondary | ICD-10-CM | POA: Diagnosis not present

## 2017-12-14 DIAGNOSIS — R600 Localized edema: Secondary | ICD-10-CM

## 2017-12-14 DIAGNOSIS — I313 Pericardial effusion (noninflammatory): Secondary | ICD-10-CM | POA: Diagnosis not present

## 2017-12-14 DIAGNOSIS — R0681 Apnea, not elsewhere classified: Secondary | ICD-10-CM | POA: Diagnosis not present

## 2017-12-14 DIAGNOSIS — I10 Essential (primary) hypertension: Secondary | ICD-10-CM | POA: Diagnosis not present

## 2017-12-14 DIAGNOSIS — I3139 Other pericardial effusion (noninflammatory): Secondary | ICD-10-CM

## 2017-12-14 NOTE — Progress Notes (Signed)
Subjective:    [name] is a 27 y.o. female.  HPI     Kimberly FoleySaima Dezzie Badilla, MD, PhD [facility] [address], [address] [address] [place], [place] [address]  Dear Dr. Rosemary HolmsPatwardhan,   I saw your patient, Kimberly Heath, upon your kind request, in my neurologic clinic today for initial consultation of her sleep disorder, in particular, concern for underlying obstructive sleep apnea. The patient is unaccompanied today. As you know, [name] is a 27 year old right-handed woman with an underlying medical history of hypertension, palpitations, history of pericardial effusion, lower extremity edema, pulmonary hypertension, and morbid obesity with a BMI of over 40, who reports snoring and excessive daytime somnolence. I reviewed your office note from 11/27/2017, which you kindly included. Her Epworth sleepiness score is 14 out of 24 today, fatigue score is 55 out of 63. She is single and lives by herself. She has no children, she works for [employer] she is a nonsmoker and does not drink alcohol, drinks caffeine in the form of coffee, usually 1 cup per day. She is currently on 3 different blood pressure medications. Per family, her snoring can be quite loud. She has woken herself up with a sense of gasping for air and has been told she makes abnormal sounds and pauses in her breathing. She has significant nocturia, about 4-5 times per average night. She does not typically staying asleep. For the past 6-7 months she has been taking melatonin 10 mg at night which has helped with sleep maintenance issues. She denies telltale symptoms of restless leg syndrome. She has woken up with numbness and tingling in both hands and her primary care physician has advised her to start using wrist splints at night for carpal tunnel. She has had occasional morning headaches, only when her pressure seems to be high in the morning. She has no family history of OSA but father snores. She is  scheduled for a echocardiogram and renal ultrasound soon.  Her Past Medical History Is Significant For: Past Medical History:  Diagnosis Date  . Hypertension   . Pericardial effusion     Her Past Surgical History Is Significant For: No past surgical history on file.  Her Family History Is Significant For: No family history on file.  Her Social History Is Significant For: Social History   Socioeconomic History  . Marital status: Single    Spouse name: None  . Number of children: None  . Years of education: None  . Highest education level: None  Social Needs  . Financial resource strain: None  . Food insecurity - worry: None  . Food insecurity - inability: None  . Transportation needs - medical: None  . Transportation needs - non-medical: None  Occupational History  . None  Tobacco Use  . Smoking status: Never Smoker  . Smokeless tobacco: Never Used  Substance and Sexual Activity  . Alcohol use: Yes    Comment: occ  . Drug use: No  . Sexual activity: None  Other Topics Concern  . None  Social History Narrative  . None    Her Allergies Are:  Allergies  Allergen Reactions  . Mushroom Extract Complex Other (See Comments)    Flu like symptoms   :   Her Current Medications Are:  Outpatient Encounter Medications as of 12/14/2017  Medication Sig  . amLODipine (NORVASC) 10 MG tablet Take 10 mg by mouth daily.  . carvedilol (COREG) 25 MG tablet Take 25 mg by mouth  2 (two) times daily with a meal.  . Cholecalciferol (VITAMIN D3) 5000 units CAPS Take 5,000 Units by mouth.  . cyclobenzaprine (FLEXERIL) 10 MG tablet Take 10 mg by mouth daily as needed for muscle spasms.  . diclofenac (VOLTAREN) 75 MG EC tablet Take 75 mg by mouth daily as needed for pain.  . Ferrous Sulfate 27 MG TABS Take by mouth.  . furosemide (LASIX) 20 MG tablet Take 20 mg by mouth daily.  . hydrochlorothiazide (HYDRODIURIL) 25 MG tablet Take 25 mg by mouth daily.  . Melatonin 10 MG TABS Take 10  mg by mouth daily.  . minocycline (DYNACIN) 50 MG tablet Take 50 mg by mouth 2 (two) times daily.  . potassium chloride (K-DUR) 10 MEQ tablet Take 10 mEq by mouth daily.   No facility-administered encounter medications on file as of 12/14/2017.   :  Review of Systems:  Out of a complete 14 point review of systems, all are reviewed and negative with the exception of these symptoms as listed below: Review of Systems  Neurological:       Patient referred to discuss setting up a sleep study. She endorses snoring and decreased energy.  Epworth Sleepiness Scale 0= would never doze 1= slight chance of dozing 2= moderate chance of dozing 3= high chance of dozing  Sitting and reading:3 Watching TV:0 Sitting inactive in a public place (ex. Theater or meeting):2 As a passenger in a car for an hour without a break:2 Lying down to rest in the afternoon:3 Sitting and talking to someone:0 Sitting quietly after lunch (no alcohol):3 In a car, while stopped in traffic:1 Total: 14     Objective:  Neurological Exam  Physical Exam Physical Examination:   Vitals:   12/14/17 1530  BP: (!) 150/92  Pulse: 74   General Examination: The patient is a very pleasant 27 y.o. female in no acute distress. She appears well-developed and well-nourished and well groomed.   HEENT: Normocephalic, atraumatic, pupils are equal, round and reactive to light and accommodation. Extraocular tracking is good without limitation to gaze excursion or nystagmus noted. Normal smooth pursuit is noted. Hearing is grossly intact. Face is symmetric with normal facial animation and normal facial sensation. Speech is clear with no dysarthria noted. There is no hypophonia. There is no lip, neck/head, jaw or voice tremor. Neck is supple with full range of passive and active motion. There are no carotid bruits on auscultation. Oropharynx exam reveals: mild mouth dryness, good dental hygiene and moderate airway crowding, due to  smaller airway entry, tonsils in place, 1+ in size bilaterally. Uvula is not enlarged but ends in a whisp like ending. Mallampati is class II. Tongue protrudes centrally and palate elevates symmetrically.  Neck size is 16 1/8 inches. She has a Mild overbite. Nasal inspection reveals no significant nasal mucosal bogginess or redness and no septal deviation.   Chest: Clear to auscultation without wheezing, rhonchi or crackles noted.  Heart: S1+S2+0, regular and normal without murmurs, rubs or gallops noted.   Abdomen: Soft, non-tender and non-distended with normal bowel sounds appreciated on auscultation.  Extremities: There is 1+ pitting edema in the distal lower extremities bilaterally. Pedal pulses are intact.  Skin: Warm and dry without trophic changes noted.  Musculoskeletal: exam reveals no obvious joint deformities, tenderness or joint swelling or erythema.   Neurologically:  Mental status: The patient is awake, alert and oriented in all 4 spheres. Her immediate and remote memory, attention, language skills and fund of knowledge are  appropriate. There is no evidence of aphasia, agnosia, apraxia or anomia. Speech is clear with normal prosody and enunciation. Thought process is linear. Mood is normal and affect is normal.  Cranial nerves II - XII are as described above under HEENT exam. In addition: shoulder shrug is normal with equal shoulder height noted. Motor exam: Normal bulk, strength and tone is noted. There is no drift, tremor or rebound. Romberg is negative. Reflexes are 1+ throughout. Babinski: Toes are flexor bilaterally. Fine motor skills and coordination: intact with normal finger taps, normal hand movements, normal rapid alternating patting, normal foot taps and normal foot agility.  Cerebellar testing: No dysmetria or intention tremor on finger to nose testing. Heel to shin is unremarkable bilaterally. There is no truncal or gait ataxia.  Sensory exam: intact to light touch in  the upper and lower extremities.  Gait, station and balance: She stands easily. No veering to one side is noted. No leaning to one side is noted. Posture is age-appropriate and stance is narrow based. Gait shows normal stride length and normal pace, normal turns. Tandem walk is unremarkable.             Assessment and Plan:  In summary, ABBYGAYLE HELFAND is a very pleasant 27 y.o.-year old female with an underlying medical history of hypertension, palpitations, history of pericardial effusion, lower extremity edema, pulmonary hypertension, and morbid obesity with a BMI of over 40, whose history and physical exam are concerning for obstructive sleep apnea (OSA). I had a long chat with the patient about my findings and the diagnosis of OSA, its prognosis and treatment options. We talked about medical treatments, surgical interventions and non-pharmacological approaches. I explained in particular the risks and ramifications of untreated moderate to severe OSA, especially with respect to developing cardiovascular disease down the Road, including congestive heart failure, difficult to treat hypertension, cardiac arrhythmias, or stroke. Even type 2 diabetes has, in part, been linked to untreated OSA. Symptoms of untreated OSA include daytime sleepiness, memory problems, mood irritability and mood disorder such as depression and anxiety, lack of energy, as well as recurrent headaches, especially morning headaches. We talked about trying to maintain a healthy lifestyle in general, as well as the importance of weight control. I encouraged the patient to eat healthy, exercise daily and keep well hydrated, to keep a scheduled bedtime and wake time routine, to not skip any meals and eat healthy snacks in between meals. I advised the patient not to drive when feeling sleepy. I recommended the following at this time: sleep study with potential positive airway pressure titration. (We will score hypopneas at 4%).   I  explained the sleep test procedure to the patient and also outlined possible surgical and non-surgical treatment options of OSA, including the use of a custom-made dental device (which would require a referral to a specialist dentist or oral surgeon), upper airway surgical options, such as pillar implants, radiofrequency surgery, tongue base surgery, and UPPP (which would involve a referral to an ENT surgeon). Rarely, jaw surgery such as mandibular advancement may be considered.  I also explained the CPAP treatment option to the patient, who indicated that she would be willing to try CPAP if the need arises. I explained the importance of being compliant with PAP treatment, not only for insurance purposes but primarily to improve Her symptoms, and for the patient's long term health benefit, including to reduce Her cardiovascular risks. I answered all her questions today and the patient was in agreement. I  would like to see her back after the sleep study is completed and encouraged her to call with any interim questions, concerns, problems or updates.   Thank you very much for allowing me to participate in the care of this nice patient. If I can be of any further assistance to you please do not hesitate to call me at [phone].  Sincerely,   Star Age, MD, PhD

## 2017-12-14 NOTE — Patient Instructions (Addendum)

## 2017-12-20 ENCOUNTER — Encounter: Payer: Self-pay | Admitting: Family Medicine

## 2018-01-02 ENCOUNTER — Telehealth: Payer: Self-pay | Admitting: Neurology

## 2018-01-02 DIAGNOSIS — G4719 Other hypersomnia: Secondary | ICD-10-CM

## 2018-01-02 NOTE — Telephone Encounter (Signed)
HST order placed. 

## 2018-01-02 NOTE — Addendum Note (Signed)
Addended by: Geronimo RunningINKINS, Latish Toutant A on: 01/02/2018 10:08 AM   Modules accepted: Orders

## 2018-01-02 NOTE — Telephone Encounter (Signed)
Insurance denied in lab. Please put in order for hst. °

## 2018-01-17 ENCOUNTER — Telehealth: Payer: Self-pay | Admitting: Neurology

## 2018-01-17 NOTE — Telephone Encounter (Signed)
We have attempted to call the patient two times to schedule sleep study. Patient has been unavailable at the phone numbers we have on file, and has not returned our calls. At this time, we will send a letter asking patient to please contact the sleep lab.  °

## 2018-05-14 ENCOUNTER — Encounter (INDEPENDENT_AMBULATORY_CARE_PROVIDER_SITE_OTHER): Payer: Self-pay

## 2018-05-16 ENCOUNTER — Other Ambulatory Visit: Payer: Self-pay

## 2018-05-16 ENCOUNTER — Emergency Department (HOSPITAL_BASED_OUTPATIENT_CLINIC_OR_DEPARTMENT_OTHER)
Admission: EM | Admit: 2018-05-16 | Discharge: 2018-05-16 | Disposition: A | Discharge status: Home / Self Care | Payer: 59 | Attending: Emergency Medicine | Admitting: Emergency Medicine

## 2018-05-16 ENCOUNTER — Encounter (HOSPITAL_BASED_OUTPATIENT_CLINIC_OR_DEPARTMENT_OTHER): Payer: Self-pay | Admitting: Adult Health

## 2018-05-16 ENCOUNTER — Emergency Department (HOSPITAL_BASED_OUTPATIENT_CLINIC_OR_DEPARTMENT_OTHER): Payer: 59

## 2018-05-16 DIAGNOSIS — R51 Headache: Secondary | ICD-10-CM | POA: Diagnosis not present

## 2018-05-16 DIAGNOSIS — I10 Essential (primary) hypertension: Secondary | ICD-10-CM | POA: Diagnosis not present

## 2018-05-16 DIAGNOSIS — Z79899 Other long term (current) drug therapy: Secondary | ICD-10-CM | POA: Diagnosis not present

## 2018-05-16 DIAGNOSIS — R519 Headache, unspecified: Secondary | ICD-10-CM

## 2018-05-16 MED ORDER — DIPHENHYDRAMINE HCL 50 MG/ML IJ SOLN
25.0000 mg | Freq: Once | INTRAMUSCULAR | Status: DC
  Filled 2018-05-16: qty 1

## 2018-05-16 MED ORDER — DIPHENHYDRAMINE HCL 25 MG PO CAPS
25.0000 mg | ORAL_CAPSULE | Freq: Once | ORAL | Status: AC
  Administered 2018-05-16: 25 mg via ORAL
  Filled 2018-05-16: qty 1

## 2018-05-16 MED ORDER — ACETAMINOPHEN 500 MG PO TABS: 1000.0000 mg | ORAL_TABLET | Freq: Four times a day (QID) | ORAL | 0 refills | Status: DC | PRN

## 2018-05-16 MED ORDER — DIPHENHYDRAMINE HCL 25 MG PO TABS: 25.0000 mg | ORAL_TABLET | Freq: Four times a day (QID) | ORAL | 0 refills | Status: DC

## 2018-05-16 MED ORDER — METOCLOPRAMIDE HCL 5 MG/ML IJ SOLN
10.0000 mg | Freq: Once | INTRAMUSCULAR | Status: DC
  Filled 2018-05-16: qty 2

## 2018-05-16 MED ORDER — METOCLOPRAMIDE HCL 10 MG PO TABS: 10.0000 mg | ORAL_TABLET | Freq: Four times a day (QID) | ORAL | 0 refills | Status: DC | PRN

## 2018-05-16 MED ORDER — METOCLOPRAMIDE HCL 10 MG PO TABS
10.0000 mg | ORAL_TABLET | Freq: Once | ORAL | Status: AC
  Administered 2018-05-16: 10 mg via ORAL
  Filled 2018-05-16: qty 1

## 2018-05-16 MED FILL — BANOPHEN 25 MG CAPSULE: 25 | 25 days supply | Qty: 100 | Fill #0

## 2018-05-16 MED FILL — ACETAMINOPHEN 500 MG TABS: 500 | 25 days supply | Qty: 100 | Fill #0

## 2018-05-16 MED FILL — METOCLOPRAMIDE 10 MG TABLET: 10 | 5 days supply | Qty: 20 | Fill #0

## 2018-05-16 NOTE — ED Triage Notes (Signed)
Presents with hx of high blood pressure, over the past two weeks she states it has been higher than normal. She has been taking her 3 BP medications as prescribed. She endorses a headache and one day when she was dizzy, but the dizziness has passed. The headache is behind her right eye and described as sharp and has been ongoing since Monday, she has been taking Ibuprofen with no relief, the headache is associated with blurry vision and nausea.

## 2018-05-16 NOTE — ED Provider Notes (Signed)
[facility] EMERGENCY DEPARTMENT Provider Note   CSN: [id number] Arrival date & time: 05/16/18  4098     History   Chief Complaint Chief Complaint  Patient presents with  . Hypertension    HPI Kimberly Heath is a 27 y.o. female.  HPI Patient reports blood pressures been more elevated than normal over the past 2 weeks.  She reports she has been having diastolic pressures up to the 90s or 100.  Typically it is closer to 80.  She also reports she has had some systolic pressures up to the 170s and 180s.  Patient reports she has been getting a right-sided headache.  It is sharp and typically behind her eye.  It usually comes on if her blood pressure is elevated.  She has noted this over a longer period of time.  This is been more frequent and pronounced since 05/09/2023.  She has been taking ibuprofen without relief.  She reports she has some blurring in the vision and nausea.  No vomiting.  No imbalance.  No focal weakness numbness or tingling.  Sister and aunt have strong migraine history.  Patient denies personal migraine history. Past Medical History:  Diagnosis Date  . Hypertension   . Pericardial effusion     There are no active problems to display for this patient.   History reviewed. No pertinent surgical history.   OB History   None      Home Medications    Prior to Admission medications   Medication Sig Start Date End Date Taking? Authorizing Provider  amLODipine (NORVASC) 10 MG tablet Take 10 mg by mouth daily. 09/02/16  Yes [provider]  carvedilol (COREG) 25 MG tablet Take 25 mg by mouth 2 (two) times daily with a meal.   Yes [provider]  hydrochlorothiazide (HYDRODIURIL) 25 MG tablet Take 25 mg by mouth daily.   Yes [provider]  minocycline (DYNACIN) 50 MG tablet Take 50 mg by mouth 2 (two) times daily. 08/07/16  Yes [provider]  acetaminophen (TYLENOL) 500 MG tablet Take 2 tablets (1,000 mg total) by  mouth every 6 (six) hours as needed. 05/16/18   Arby Barrette, MD  Cholecalciferol (VITAMIN D3) 5000 units CAPS Take 5,000 Units by mouth.    [provider]  cyclobenzaprine (FLEXERIL) 10 MG tablet Take 10 mg by mouth daily as needed for muscle spasms. 09/02/16   [provider]  diclofenac (VOLTAREN) 75 MG EC tablet Take 75 mg by mouth daily as needed for pain. 08/24/16   [provider]  diphenhydrAMINE (BENADRYL) 25 MG tablet Take 1 tablet (25 mg total) by mouth every 6 (six) hours. 05/16/18   Arby Barrette, MD  Ferrous Sulfate 27 MG TABS Take by mouth.    [provider]  furosemide (LASIX) 20 MG tablet Take 20 mg by mouth daily.    [provider]  Melatonin 10 MG TABS Take 10 mg by mouth daily.    [provider]  metoCLOPramide (REGLAN) 10 MG tablet Take 1 tablet (10 mg total) by mouth every 6 (six) hours as needed for nausea (nausea/headache). Take with 25 mg of Benadryl and 1000 mg of acetaminophen. 05/16/18   Arby Barrette, MD  potassium chloride (K-DUR) 10 MEQ tablet Take 10 mEq by mouth daily.    [provider]    Family History History reviewed. No pertinent family history.  Social History Social History   Tobacco Use  . Smoking status: Never Smoker  .  Smokeless tobacco: Never Used  Substance Use Topics  . Alcohol use: Yes    Comment: occ  . Drug use: No     Allergies   Mushroom extract complex   Review of Systems Review of Systems 10 Systems reviewed and are negative for acute change except as noted in the HPI.   Physical Exam Updated Vital Signs BP 117/73 (BP Location: Right Arm)   Pulse 76   Temp 99.2 F (37.3 C) (Oral)   Resp 16   Ht 5\' 3"  (1.6 m)   Wt 106.6 kg (235 lb)   LMP 04/23/2018 (Exact Date)   SpO2 100%   BMI 41.63 kg/m   Physical Exam  Constitutional: She is oriented to person, place, and time. She appears well-developed and well-nourished.  Patient is well in appearance.   She is sitting at the edge of the stretcher no acute distress.  Mental status clear pleasant and interactive.  No somnolence.  HENT:  Head: Normocephalic and atraumatic.  Bilateral TMs normal.  Oral cavity normal with normal dentition.  No posterior oropharyngeal swelling or erythema.  Patient does have reproducible pain to palpation on the temple.  Pain is also reproduced by percussion over the brow.  No skin changes or soft tissue anomalies.  Eyes: Pupils are equal, round, and reactive to light. EOM are normal.  Right fundus normal optic discs.  No appreciable papilledema.  Neck: Neck supple.  Cardiovascular: Normal rate, regular rhythm, normal heart sounds and intact distal pulses.  Pulmonary/Chest: Effort normal and breath sounds normal.  Abdominal: Soft. Bowel sounds are normal. She exhibits no distension. There is no tenderness.  Musculoskeletal: Normal range of motion. She exhibits no edema.  Neurological: She is alert and oriented to person, place, and time. She has normal strength. No cranial nerve deficit. She exhibits normal muscle tone. Coordination normal. GCS eye subscore is 4. GCS verbal subscore is 5. GCS motor subscore is 6.  Skin: Skin is warm, dry and intact.  Psychiatric: She has a normal mood and affect.     ED Treatments / Results  Labs (all labs ordered are listed, but only abnormal results are displayed) Labs Reviewed - No data to display  EKG None  Radiology Ct Head Wo Contrast  Result Date: 05/16/2018 CLINICAL DATA:  Headache, nausea, hypertension, blurred vision EXAM: CT HEAD WITHOUT CONTRAST TECHNIQUE: Contiguous axial images were obtained from the base of the skull through the vertex without intravenous contrast. COMPARISON:  None. FINDINGS: Brain: No acute intracranial abnormality. Specifically, no hemorrhage, hydrocephalus, mass lesion, acute infarction, or significant intracranial injury. Vascular: No hyperdense vessel or unexpected calcification. Skull: No  acute calvarial abnormality. Sinuses/Orbits: Visualized paranasal sinuses and mastoids clear. Orbital soft tissues unremarkable. Other: None IMPRESSION: Normal study. Electronically Signed   By: Charlett NoseKevin  Dover M.D.   On: 05/16/2018 10:46    Procedures Procedures (including critical care time)  Medications Ordered in ED Medications  metoCLOPramide (REGLAN) tablet 10 mg (10 mg Oral Given 05/16/18 1044)  diphenhydrAMINE (BENADRYL) capsule 25 mg (25 mg Oral Given 05/16/18 1044)     Initial Impression / Assessment and Plan / ED Course  I have reviewed the triage vital signs and the nursing notes.  Pertinent labs & imaging results that were available during my care of the patient were reviewed by me and considered in my medical decision making (see chart for details).      Final Clinical Impressions(s) / ED Diagnoses   Final diagnoses:  Nonintractable episodic headache, unspecified headache  type   Patient is clinically well in appearance.  She is attributing headaches to blood pressure.  Blood pressure is normal in the emergency department.  Quality of the headache being unilateral and sharp with normal blood pressure is not suggestive of hypertensive etiology.  Patient has no associated neurologic symptoms.  Normal pupillary responses and funduscopic exam grossly within normal limits.  Patient has reproducible discomfort along the brow ridge and side of the head.  Patient does have strong migraine history in the family.  I did is higher suspicion at this time for cluster type migraine headache possibly tension headache.  Patient is advised however she will need further evaluation to make a definitive diagnosis.  She is counseled at this time to follow-up with her PCP for reevaluation and try symptomatic treatment with Reglan Benadryl and acetaminophen.  At this time with prior history of hypertension and patient did not experience any improvement with NSAIDs, will counseled to avoid NSAIDs at this  time to trial alternative treatment.  Return precautions reviewed. ED Discharge Orders        Ordered    metoCLOPramide (REGLAN) 10 MG tablet  Every 6 hours PRN     05/16/18 1137    diphenhydrAMINE (BENADRYL) 25 MG tablet  Every 6 hours     05/16/18 1137    acetaminophen (TYLENOL) 500 MG tablet  Every 6 hours PRN     05/16/18 1137       Arby Barrette, MD 05/16/18 1144

## 2018-05-16 NOTE — Discharge Instructions (Addendum)
1.  Is very important that you follow-up with your doctor for recheck for response to advise treatment. 2.  You may consider getting a different blood pressure machine.  Your blood pressure in the emergency department did read normal.  You need to confirm that your machine is accurate. 3.  Typically do not make diagnosis of new migraine headache in the emergency department.  Your headache however is suggestive of cluster type headache and should be further evaluated to determine if this is the cause, as you might benefit from more specific treatment therapy. 4.Return to the emergency department if your symptoms worsen or you develop new symptoms like problems with your vision, imbalance, pain suddenly much worse or other concerning symptoms.

## 2018-05-17 ENCOUNTER — Encounter (INDEPENDENT_AMBULATORY_CARE_PROVIDER_SITE_OTHER): Payer: 59

## 2018-05-24 ENCOUNTER — Ambulatory Visit (INDEPENDENT_AMBULATORY_CARE_PROVIDER_SITE_OTHER): Payer: 59 | Admitting: Family Medicine

## 2018-06-11 ENCOUNTER — Ambulatory Visit (INDEPENDENT_AMBULATORY_CARE_PROVIDER_SITE_OTHER): Payer: 59 | Admitting: Family Medicine

## 2018-06-11 ENCOUNTER — Encounter (INDEPENDENT_AMBULATORY_CARE_PROVIDER_SITE_OTHER): Payer: Self-pay

## 2018-07-06 DIAGNOSIS — E538 Deficiency of other specified B group vitamins: Secondary | ICD-10-CM | POA: Diagnosis not present

## 2018-07-12 DIAGNOSIS — B353 Tinea pedis: Secondary | ICD-10-CM | POA: Diagnosis not present

## 2018-07-12 DIAGNOSIS — L081 Erythrasma: Secondary | ICD-10-CM | POA: Diagnosis not present

## 2018-07-21 DIAGNOSIS — F321 Major depressive disorder, single episode, moderate: Secondary | ICD-10-CM | POA: Diagnosis not present

## 2018-07-21 DIAGNOSIS — F1011 Alcohol abuse, in remission: Secondary | ICD-10-CM | POA: Diagnosis not present

## 2018-08-11 DIAGNOSIS — F1011 Alcohol abuse, in remission: Secondary | ICD-10-CM | POA: Diagnosis not present

## 2018-08-11 DIAGNOSIS — F321 Major depressive disorder, single episode, moderate: Secondary | ICD-10-CM | POA: Diagnosis not present

## 2018-08-16 DIAGNOSIS — G8929 Other chronic pain: Secondary | ICD-10-CM | POA: Diagnosis not present

## 2018-08-16 DIAGNOSIS — E538 Deficiency of other specified B group vitamins: Secondary | ICD-10-CM | POA: Diagnosis not present

## 2018-08-16 DIAGNOSIS — D649 Anemia, unspecified: Secondary | ICD-10-CM | POA: Diagnosis not present

## 2018-08-16 DIAGNOSIS — M545 Low back pain: Secondary | ICD-10-CM | POA: Diagnosis not present

## 2018-09-14 DIAGNOSIS — R350 Frequency of micturition: Secondary | ICD-10-CM | POA: Diagnosis not present

## 2018-09-14 DIAGNOSIS — R102 Pelvic and perineal pain: Secondary | ICD-10-CM | POA: Diagnosis not present

## 2018-11-15 DIAGNOSIS — D649 Anemia, unspecified: Secondary | ICD-10-CM | POA: Diagnosis not present

## 2018-11-15 DIAGNOSIS — N949 Unspecified condition associated with female genital organs and menstrual cycle: Secondary | ICD-10-CM | POA: Diagnosis not present

## 2018-11-15 DIAGNOSIS — E538 Deficiency of other specified B group vitamins: Secondary | ICD-10-CM | POA: Diagnosis not present

## 2018-11-15 DIAGNOSIS — Z113 Encounter for screening for infections with a predominantly sexual mode of transmission: Secondary | ICD-10-CM | POA: Diagnosis not present

## 2018-11-15 DIAGNOSIS — R5383 Other fatigue: Secondary | ICD-10-CM | POA: Diagnosis not present

## 2018-12-03 DIAGNOSIS — J069 Acute upper respiratory infection, unspecified: Secondary | ICD-10-CM | POA: Diagnosis not present

## 2018-12-03 DIAGNOSIS — B9789 Other viral agents as the cause of diseases classified elsewhere: Secondary | ICD-10-CM | POA: Diagnosis not present

## 2018-12-06 DIAGNOSIS — R062 Wheezing: Secondary | ICD-10-CM | POA: Diagnosis not present

## 2018-12-06 DIAGNOSIS — J069 Acute upper respiratory infection, unspecified: Secondary | ICD-10-CM | POA: Diagnosis not present

## 2018-12-12 DIAGNOSIS — H43813 Vitreous degeneration, bilateral: Secondary | ICD-10-CM | POA: Diagnosis not present

## 2018-12-26 DIAGNOSIS — Z6841 Body Mass Index (BMI) 40.0 and over, adult: Secondary | ICD-10-CM | POA: Diagnosis not present

## 2018-12-26 DIAGNOSIS — Z124 Encounter for screening for malignant neoplasm of cervix: Secondary | ICD-10-CM | POA: Diagnosis not present

## 2018-12-26 DIAGNOSIS — Z01419 Encounter for gynecological examination (general) (routine) without abnormal findings: Secondary | ICD-10-CM | POA: Diagnosis not present

## 2018-12-26 DIAGNOSIS — Z3202 Encounter for pregnancy test, result negative: Secondary | ICD-10-CM | POA: Diagnosis not present

## 2018-12-26 DIAGNOSIS — Z130 Encounter for screening for diseases of the blood and blood-forming organs and certain disorders involving the immune mechanism: Secondary | ICD-10-CM | POA: Diagnosis not present

## 2019-01-15 IMAGING — CT CT HEAD W/O CM
3 series · 16 of 47 positions shown, 19 images · non-contrast
Comparison: None.

CLINICAL DATA: Headache, nausea, hypertension, blurred vision

EXAM:
CT HEAD WITHOUT CONTRAST
TECHNIQUE: Contiguous axial images were obtained from the base of the skull
through the vertex without intravenous contrast.

[Series 2: head wo · axial · 0.39mm/px · z∈[-190,-55]mm · 10 of 33 slices shown, 13 images]
[im 3/33  brain]
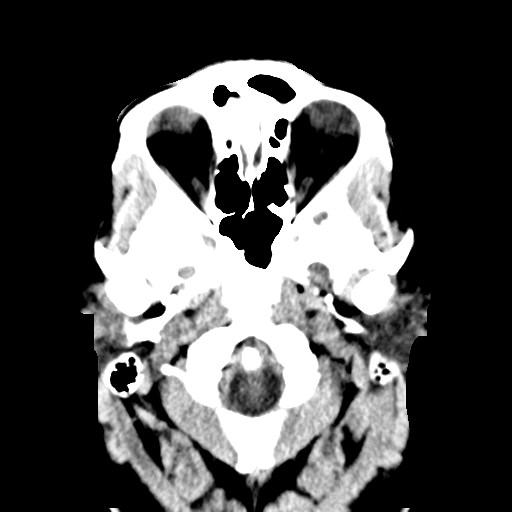
[im 3/33  bone]
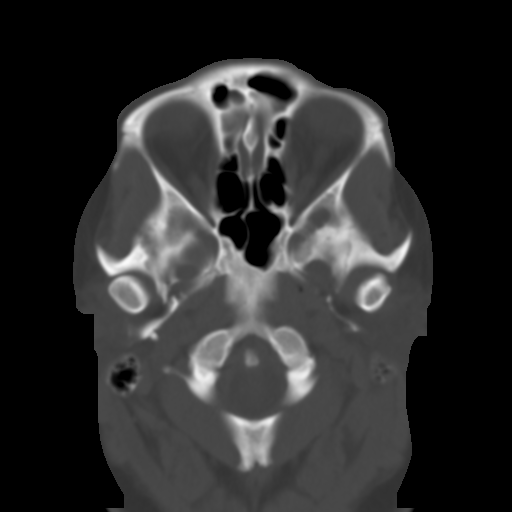
[im 6/33  brain]
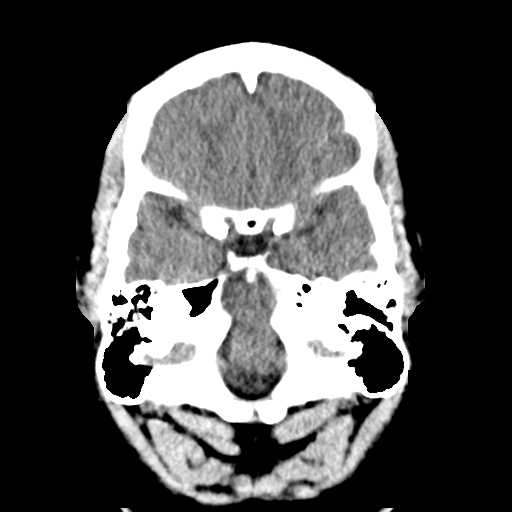
[im 9/33  brain]
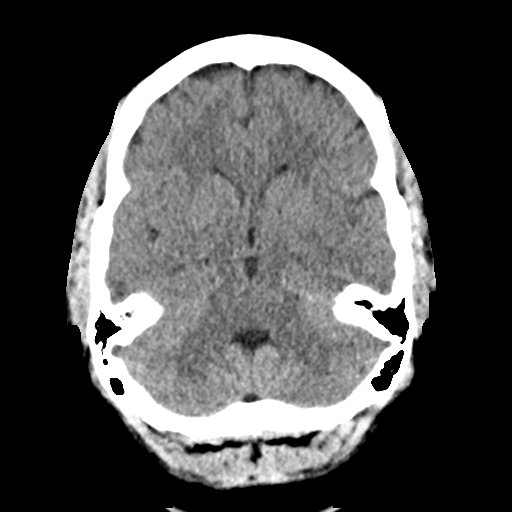
[im 12/33  brain]
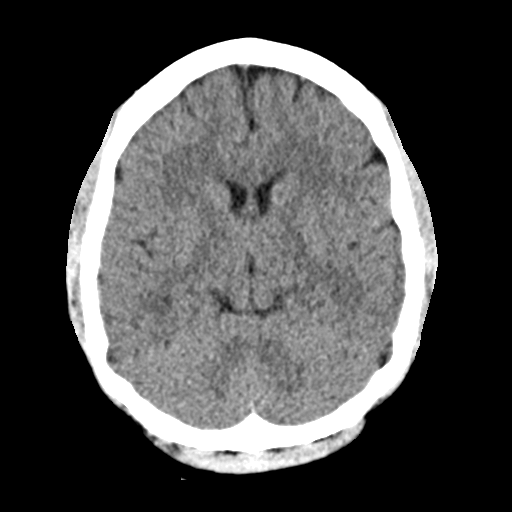
[im 15/33  brain]
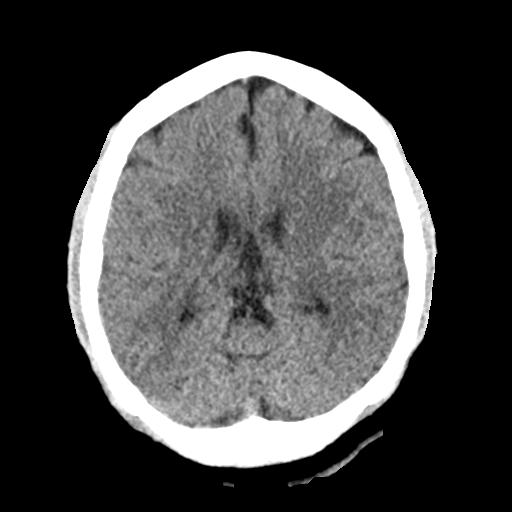
[im 15/33  bone]
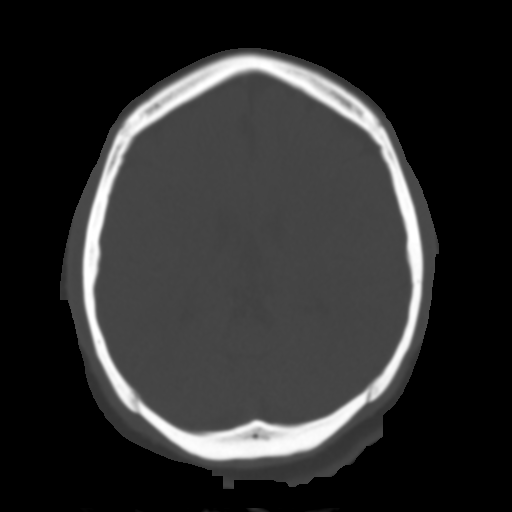
[im 18/33  brain]
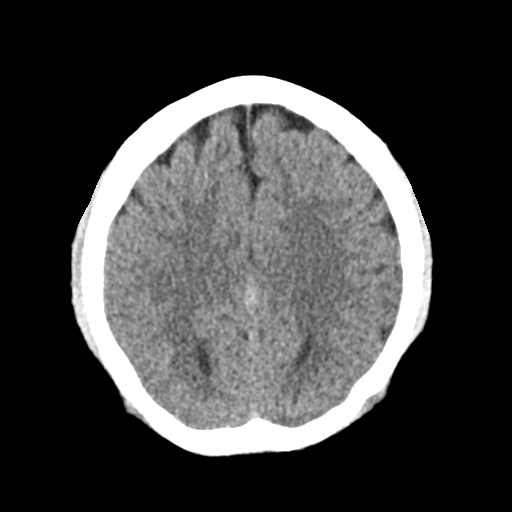
[im 21/33  brain]
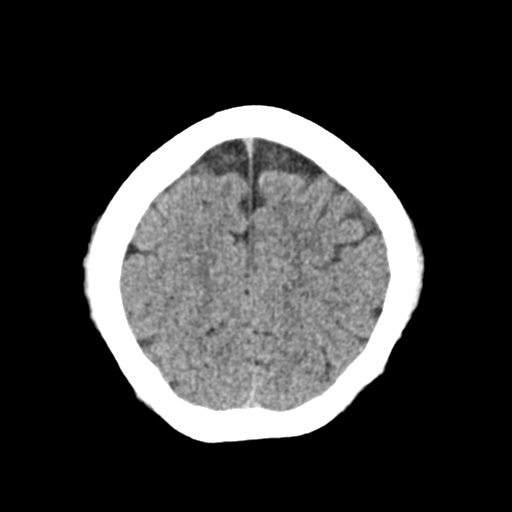
[im 25/33  brain]
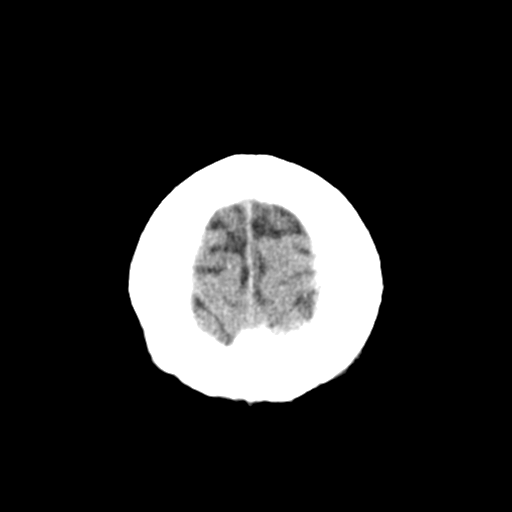
[im 27/33  brain]
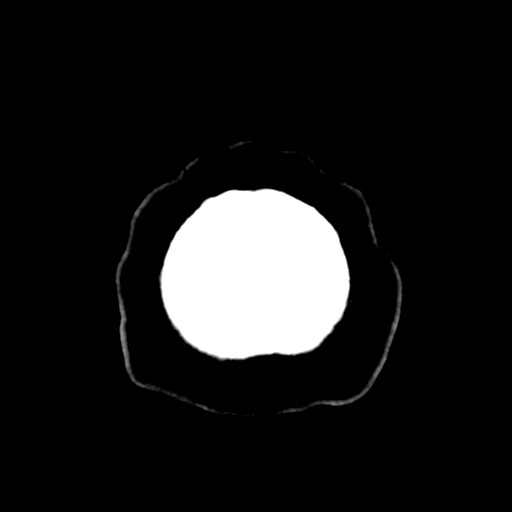
[im 27/33  bone]
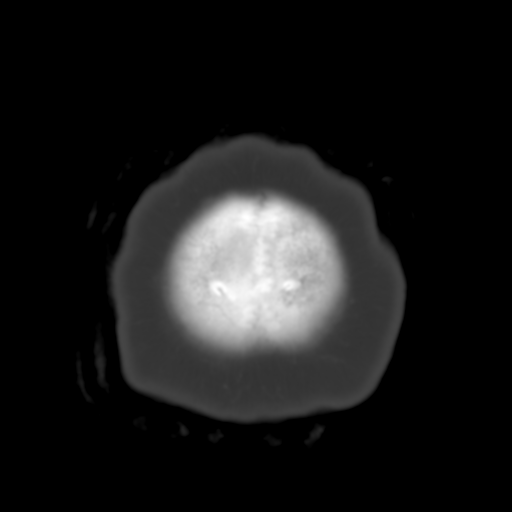
[im 30/33  brain]
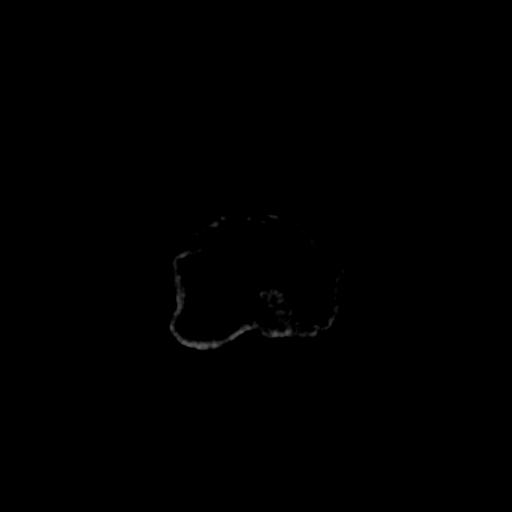

[Series 4: coronal soft · coronal · 0.33mm/px · 3 of 67 slices shown]
[im 23/67  brain]
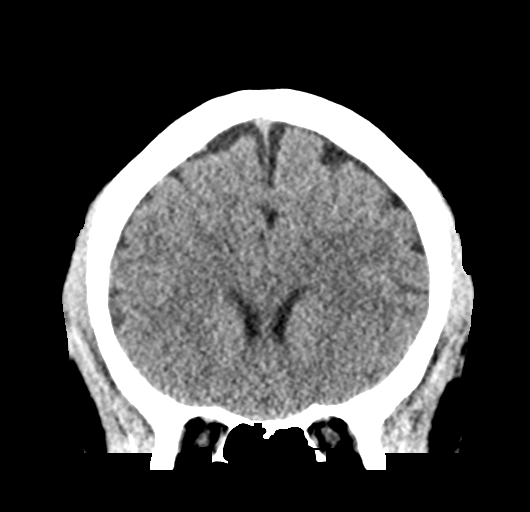
[im 30/67  brain]
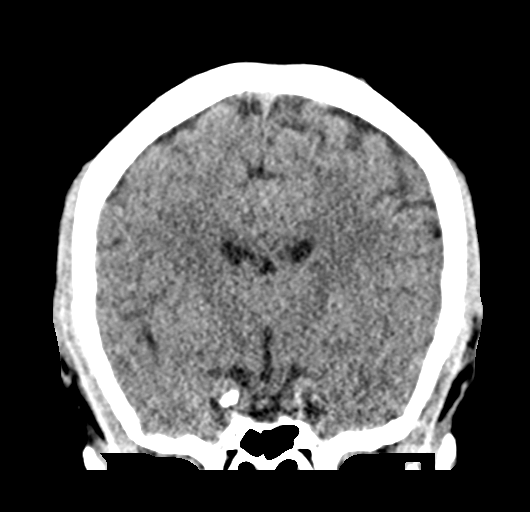
[im 37/67  brain]
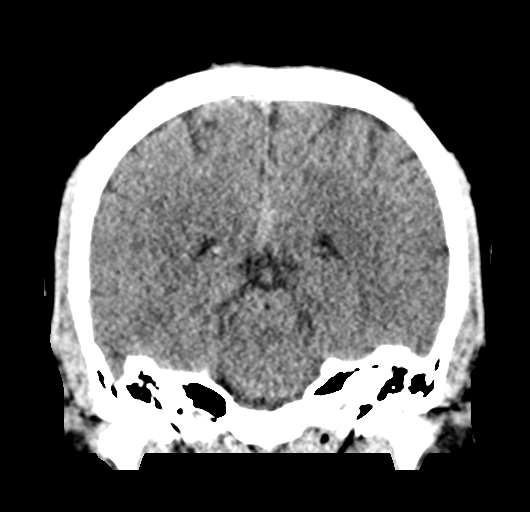

[Series 5: sag soft · sagittal · 0.33mm/px · 3 of 54 slices shown]
[im 18/54  brain]
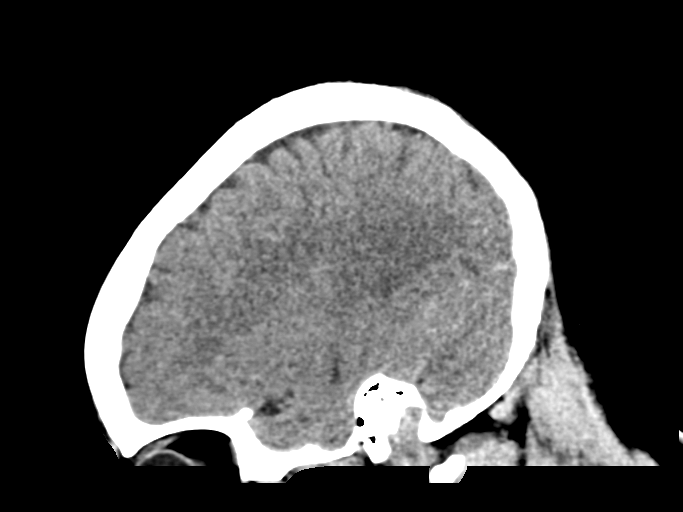
[im 27/54  brain]
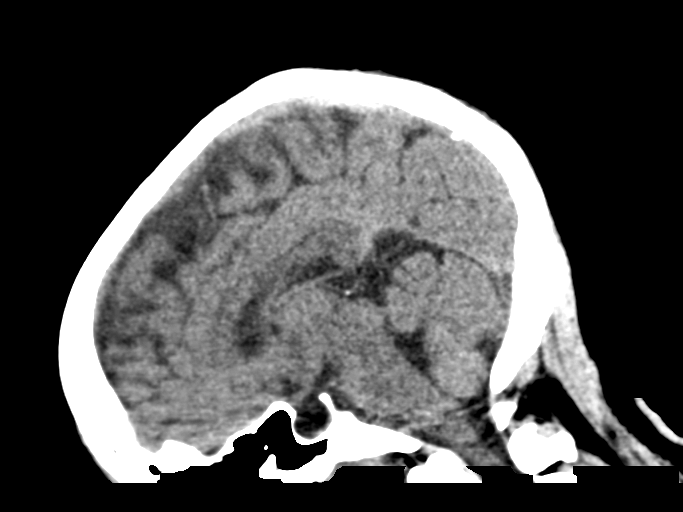
[im 36/54  brain]
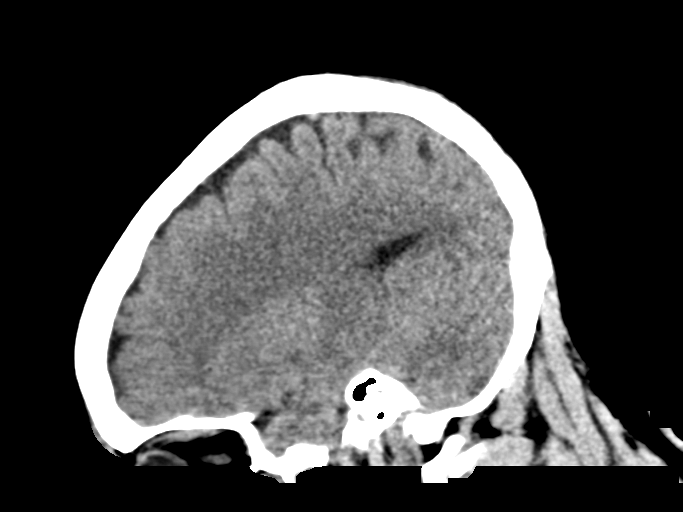

[16 of 47 positions shown; findings below may reference images not displayed]

FINDINGS: Brain: No acute intracranial abnormality. Specifically, no
hemorrhage, hydrocephalus, mass lesion, acute infarction, or
significant intracranial injury.

Vascular: No hyperdense vessel or unexpected calcification.

Skull: No acute calvarial abnormality.

Sinuses/Orbits: Visualized paranasal sinuses and mastoids clear.
Orbital soft tissues unremarkable.

Other: None
IMPRESSION: Normal study.

## 2019-01-19 DIAGNOSIS — F321 Major depressive disorder, single episode, moderate: Secondary | ICD-10-CM | POA: Diagnosis not present

## 2019-01-19 DIAGNOSIS — F1011 Alcohol abuse, in remission: Secondary | ICD-10-CM | POA: Diagnosis not present

## 2019-04-30 DIAGNOSIS — L218 Other seborrheic dermatitis: Secondary | ICD-10-CM | POA: Diagnosis not present

## 2019-05-08 ENCOUNTER — Ambulatory Visit (INDEPENDENT_AMBULATORY_CARE_PROVIDER_SITE_OTHER): Payer: BC Managed Care – PPO | Admitting: Family Medicine

## 2019-05-08 ENCOUNTER — Encounter (INDEPENDENT_AMBULATORY_CARE_PROVIDER_SITE_OTHER): Payer: Self-pay | Admitting: Family Medicine

## 2019-05-08 ENCOUNTER — Other Ambulatory Visit: Payer: Self-pay

## 2019-05-08 VITALS — BP 142/82 | HR 91 | Temp 98.3°F | Ht 63.0 in | Wt 243.0 lb

## 2019-05-08 DIAGNOSIS — Z6841 Body Mass Index (BMI) 40.0 and over, adult: Secondary | ICD-10-CM

## 2019-05-08 DIAGNOSIS — G4733 Obstructive sleep apnea (adult) (pediatric): Secondary | ICD-10-CM

## 2019-05-08 DIAGNOSIS — Z9189 Other specified personal risk factors, not elsewhere classified: Secondary | ICD-10-CM

## 2019-05-08 DIAGNOSIS — Z1331 Encounter for screening for depression: Secondary | ICD-10-CM

## 2019-05-08 DIAGNOSIS — R5383 Other fatigue: Secondary | ICD-10-CM | POA: Diagnosis not present

## 2019-05-08 DIAGNOSIS — R0602 Shortness of breath: Secondary | ICD-10-CM

## 2019-05-08 DIAGNOSIS — I10 Essential (primary) hypertension: Secondary | ICD-10-CM | POA: Diagnosis not present

## 2019-05-08 DIAGNOSIS — Z0289 Encounter for other administrative examinations: Secondary | ICD-10-CM

## 2019-05-09 LAB — COMPREHENSIVE METABOLIC PANEL
ALT: 51 IU/L — ABNORMAL HIGH (ref 0–32)
AST: 44 IU/L — ABNORMAL HIGH (ref 0–40)
Albumin/Globulin Ratio: 0.8 — ABNORMAL LOW (ref 1.2–2.2)
Albumin: 4 g/dL (ref 3.9–5.0)
Alkaline Phosphatase: 59 IU/L (ref 39–117)
BUN/Creatinine Ratio: 8 — ABNORMAL LOW (ref 9–23)
BUN: 6 mg/dL (ref 6–20)
Bilirubin Total: 0.6 mg/dL (ref 0.0–1.2)
CO2: 23 mmol/L (ref 20–29)
Calcium: 8.7 mg/dL (ref 8.7–10.2)
Chloride: 100 mmol/L (ref 96–106)
Creatinine, Ser: 0.8 mg/dL (ref 0.57–1.00)
GFR calc Af Amer: 117 mL/min/{1.73_m2} (ref >59)
GFR calc non Af Amer: 101 mL/min/{1.73_m2} (ref >59)
Globulin, Total: 5 g/dL — ABNORMAL HIGH (ref 1.5–4.5)
Glucose: 95 mg/dL (ref 65–99)
Potassium: 3.8 mmol/L (ref 3.5–5.2)
Sodium: 137 mmol/L (ref 134–144)
Total Protein: 9 g/dL — ABNORMAL HIGH (ref 6.0–8.5)

## 2019-05-09 LAB — HEMOGLOBIN A1C
Est. average glucose Bld gHb Est-mCnc: 105 mg/dL
Hgb A1c MFr Bld: 5.3 % (ref 4.8–5.6)

## 2019-05-09 LAB — LIPID PANEL WITH LDL/HDL RATIO
Cholesterol, Total: 193 mg/dL (ref 100–199)
HDL: 69 mg/dL (ref >39)
LDL Calculated: 108 mg/dL — ABNORMAL HIGH (ref 0–99)
LDl/HDL Ratio: 1.6 ratio (ref 0.0–3.2)
Triglycerides: 80 mg/dL (ref 0–149)
VLDL Cholesterol Cal: 16 mg/dL (ref 5–40)

## 2019-05-09 LAB — CBC WITH DIFFERENTIAL
Basophils Absolute: 0 10*3/uL (ref 0.0–0.2)
Basos: 0 %
EOS (ABSOLUTE): 0 10*3/uL (ref 0.0–0.4)
Eos: 1 %
Hematocrit: 36.9 % (ref 34.0–46.6)
Hemoglobin: 12 g/dL (ref 11.1–15.9)
Immature Grans (Abs): 0 10*3/uL (ref 0.0–0.1)
Immature Granulocytes: 0 %
Lymphocytes Absolute: 1.7 10*3/uL (ref 0.7–3.1)
Lymphs: 35 %
MCH: 24.8 pg — ABNORMAL LOW (ref 26.6–33.0)
MCHC: 32.5 g/dL (ref 31.5–35.7)
MCV: 76 fL — ABNORMAL LOW (ref 79–97)
Monocytes Absolute: 0.5 10*3/uL (ref 0.1–0.9)
Monocytes: 9 %
Neutrophils Absolute: 2.7 10*3/uL (ref 1.4–7.0)
Neutrophils: 55 %
RBC: 4.84 x10E6/uL (ref 3.77–5.28)
RDW: 18.3 % — ABNORMAL HIGH (ref 11.7–15.4)
WBC: 5 10*3/uL (ref 3.4–10.8)

## 2019-05-09 LAB — SPECIMEN STATUS REPORT

## 2019-05-09 LAB — FOLATE: Folate: >20 ng/mL (ref >3.0)

## 2019-05-09 LAB — VITAMIN B12: Vitamin B-12: 781 pg/mL (ref 232–1245)

## 2019-05-09 LAB — T4, FREE: Free T4: 1.06 ng/dL (ref 0.82–1.77)

## 2019-05-09 LAB — TSH: TSH: 1.46 u[IU]/mL (ref 0.450–4.500)

## 2019-05-09 LAB — INSULIN, RANDOM: INSULIN: 32.8 u[IU]/mL — ABNORMAL HIGH (ref 2.6–24.9)

## 2019-05-09 LAB — T3: T3, Total: 169 ng/dL (ref 71–180)

## 2019-05-09 LAB — VITAMIN D 25 HYDROXY (VIT D DEFICIENCY, FRACTURES): Vit D, 25-Hydroxy: 24.7 ng/mL — ABNORMAL LOW (ref 30.0–100.0)

## 2019-05-09 NOTE — Progress Notes (Signed)
.  Office: [phone]  /  Fax: [phone]   HPI:   Chief Complaint: OBESITY  Kimberly Heath (MR# [id number]) is a 28 y.o. female who presents on 05/08/2019 for obesity evaluation and treatment. Current BMI is Body mass index is 43.05 kg/m. Keionna has struggled with obesity for years and has been unsuccessful in either losing weight or maintaining long term weight loss. Veneda attended our information session and states she is currently in the action stage of change and ready to dedicate time achieving and maintaining a healthier weight.  Kameron heard about our clinic from a co-worker. She is doing [employer] chills or breakfast smoothie (satisfied until 11 am). Around 11 am, she is doing rice (3 cups), veggies (1 cup), and shrimp or chicken (1 breast), and feels full until around 5 or 6 pm. For dinner she is doing the same as lunch or pasta 2 cups, chicken, and 1 cup of wine or water. After dinner snack is smart popcorn.  Hayly states her family eats meals together her desired weight loss is 63-83 lbs she started gaining weight in 2015 her heaviest weight ever was 247 lbs she has significant food cravings issues  she snacks frequently in the evenings she skips meals frequently she is frequently drinking liquids with calories she frequently makes poor food choices she has problems with excessive hunger  she has binge eating behaviors she struggles with emotional eating    Fatigue Angeliz feels her energy is lower than it should be. This has worsened with weight gain and has not worsened recently. Genevra admits to daytime somnolence and  admits to waking up still tired. Patient is at risk for obstructive sleep apnea. Patent has a history of symptoms of daytime fatigue and morning headache. Patient generally gets 5 hours of sleep per night, and states they generally have nightime awakenings. Snoring is present. Apneic episodes are present. Epworth Sleepiness Score is 6.  Dyspnea  on exertion Emileigh notes increasing shortness of breath with exercising and seems to be worsening over time with weight gain. She notes getting out of breath sooner with activity than she used to. This has not gotten worse recently. EKG-normal sinus rhythm at 84 BPM. Duru denies orthopnea.  Hypertension TYIANNA MENEFEE is a 28 y.o. female with hypertension. Orilla is on 3 medications and has a history of preeclampsia. She has a history of seeing Cardiology and had an echocardiogram. Her blood pressure is controlled and she denies chest pain. She is working on weight loss to help control her blood pressure with the goal of decreasing her risk of heart attack and stroke.   Obstructive Sleep Apnea Chrisma saw a doctor at [facility] (Dr. Frances Furbish). She didn't get a sleep study.  At risk for cardiovascular disease Allianna is at a higher than average risk for cardiovascular disease due to obesity, hypertension, and OSA. She currently denies any chest pain.  Depression Screen Justiss's Food and Mood (modified PHQ-9) score was  Depression screen PHQ 2/9 05/08/2019  Decreased Interest 3  Down, Depressed, Hopeless 3  PHQ - 2 Score 6  Altered sleeping 0  Tired, decreased energy 3  Change in appetite 3  Feeling bad or failure about yourself  2  Trouble concentrating 0  Moving slowly or fidgety/restless 0  Suicidal thoughts 1  PHQ-9 Score 15  Difficult doing work/chores Very difficult    ASSESSMENT AND PLAN:  Other fatigue - Plan: EKG 12-Lead, Vitamin B12, CBC With Differential, Folate, Hemoglobin A1c, Insulin,  random, T3, T4, free, TSH, VITAMIN D 25 Hydroxy (Vit-D Deficiency, Fractures)  Shortness of breath on exertion - Plan: Lipid Panel With LDL/HDL Ratio  Essential hypertension - Plan: Comprehensive metabolic panel  OSA (obstructive sleep apnea)  Depression screening  At risk for heart disease  Class 3 severe obesity with serious comorbidity and body mass index  (BMI) of 40.0 to 44.9 in adult, unspecified obesity type (HCC)  PLAN:  Fatigue [name] was informed that her fatigue may be related to obesity, depression or many other causes. Labs will be ordered, and in the meanwhile [name] has agreed to work on diet, exercise and weight loss to help with fatigue. Proper sleep hygiene was discussed including the need for 7-8 hours of quality sleep each night. A sleep study was not ordered based on symptoms and Epworth score.  Dyspnea on exertion Abi's shortness of breath appears to be obesity related and exercise induced. She has agreed to work on weight loss and gradually increase exercise to treat her exercise induced shortness of breath. If [name] follows our instructions and loses weight without improvement of her shortness of breath, we will plan to refer to pulmonology. We will monitor this condition regularly. [name] agrees to this plan.  Hypertension We discussed sodium restriction, working on healthy weight loss, and a regular exercise program as the means to achieve improved blood pressure control. [name] agreed with this plan and agreed to follow up as directed. We will continue to monitor her blood pressure as well as her progress with the above lifestyle modifications. [name] agrees to continue her medications as prescribed and will watch for signs of hypotension as she continues her lifestyle modifications. EKG was done today and we will check CMP today. [name] agrees to follow up with our clinic in 2 weeks.  Obstructive Sleep Apnea [name] [facility] to [facility] for a follow up. [name] agrees to follow up with our clinic in 2 weeks.  Cardiovascular risk counseling [name] was given extended (15 minutes) coronary artery disease prevention counseling today. She is 28 y.o. female and has risk factors for heart disease including obesity, hypertension, and OSA. We discussed intensive lifestyle modifications today with an  emphasis on specific weight loss instructions and strategies. Pt was also informed of the importance of increasing exercise and decreasing saturated fats to help prevent heart disease.  Depression Screen [name] had a strongly positive depression screening. Depression is commonly associated with obesity and often results in emotional eating behaviors. We will monitor this closely and work on CBT to help improve the non-hunger eating patterns. Referral to Psychology may be required if no improvement is seen as she continues in our clinic.  Obesity [name] is currently in the action stage of change and her goal is to continue with weight loss efforts She has agreed to follow the Category 3 plan [name] has been instructed to work up to a goal of 150 minutes of combined cardio and strengthening exercise per week for weight loss and overall health benefits. We discussed the following Behavioral Modification Strategies today: increasing lean protein intake, increasing vegetables and work on meal planning and easy cooking plans, and planning for success  [name] has agreed to follow up with our clinic in 2 weeks. She was informed of the importance of frequent follow up visits to maximize her success with intensive lifestyle modifications for her multiple health conditions. She was informed we would discuss her lab results at her next visit unless there is a critical issue  that needs to be addressed sooner. Idolina agreed to keep her next visit at the agreed upon time to discuss these results.  ALLERGIES: Allergies  Allergen Reactions  . Mushroom Extract Complex Other (See Comments)    Flu like symptoms     MEDICATIONS: Current Outpatient Medications on File Prior to Visit  Medication Sig Dispense Refill  . amLODipine (NORVASC) 10 MG tablet Take 10 mg by mouth daily.  0  . carvedilol (COREG) 25 MG tablet Take 25 mg by mouth 2 (two) times daily with a meal.    . diclofenac (VOLTAREN) 75 MG EC  tablet Take 75 mg by mouth daily as needed for pain.  0  . furosemide (LASIX) 20 MG tablet Take 20 mg by mouth daily.    . hydrochlorothiazide (HYDRODIURIL) 25 MG tablet Take 25 mg by mouth daily.    . Melatonin 10 MG TABS Take 10 mg by mouth daily.    . minocycline (DYNACIN) 50 MG tablet Take 50 mg by mouth 2 (two) times daily.  1  . potassium chloride (K-DUR) 10 MEQ tablet Take 10 mEq by mouth daily.    Marland Kitchen acetaminophen (TYLENOL) 500 MG tablet Take 2 tablets (1,000 mg total) by mouth every 6 (six) hours as needed. (Patient not taking: Reported on 05/08/2019) 30 tablet 0  . Cholecalciferol (VITAMIN D3) 5000 units CAPS Take 5,000 Units by mouth.    . cyclobenzaprine (FLEXERIL) 10 MG tablet Take 10 mg by mouth daily as needed for muscle spasms.  1  . diphenhydrAMINE (BENADRYL) 25 MG tablet Take 1 tablet (25 mg total) by mouth every 6 (six) hours. (Patient not taking: Reported on 05/08/2019) 20 tablet 0  . Ferrous Sulfate 27 MG TABS Take by mouth.    . metoCLOPramide (REGLAN) 10 MG tablet Take 1 tablet (10 mg total) by mouth every 6 (six) hours as needed for nausea (nausea/headache). Take with 25 mg of Benadryl and 1000 mg of acetaminophen. (Patient not taking: Reported on 05/08/2019) 20 tablet 0   No current facility-administered medications on file prior to visit.     PAST MEDICAL HISTORY: Past Medical History:  Diagnosis Date  . Anxiety   . Back pain   . Chest pain   . Depression   . Edema, lower extremity   . Fluid collection (edema) in the arms, legs, hands and feet   . Hypertension   . Pericardial effusion   . Sleep apnea   . SOB (shortness of breath)     PAST SURGICAL HISTORY: History reviewed. No pertinent surgical history.  SOCIAL HISTORY: Social History   Tobacco Use  . Smoking status: Never Smoker  . Smokeless tobacco: Never Used  Substance Use Topics  . Alcohol use: Yes    Comment: occ  . Drug use: No    FAMILY HISTORY: Family History  Problem Relation Age of  Onset  . High blood pressure Father     ROS: Review of Systems  Constitutional: Positive for malaise/fatigue. Negative for weight loss.  HENT:       + Nasal stuffiness  Eyes:       + Wear glasses or contacts  Respiratory: Positive for shortness of breath (with exertion).        + Painful or difficulty breathing  Cardiovascular: Negative for chest pain and orthopnea.       + Chest pain/Discomfort + Leg cramping  Musculoskeletal: Positive for back pain and neck pain.       + Neck stiffness + Muscle  stiffness  Neurological: Positive for headaches.  Psychiatric/Behavioral: Positive for depression. Negative for suicidal ideas. The patient is nervous/anxious.        + Stress    PHYSICAL EXAM: Blood pressure (!) 142/82, pulse 91, temperature 98.3 F (36.8 C), temperature source Oral, height 5\' 3"  (1.6 m), weight 243 lb (110.2 kg), last menstrual period 04/14/2019, SpO2 100 %. Body mass index is 43.05 kg/m. Physical Exam Vitals signs reviewed.  Constitutional:      Appearance: Normal appearance. She is obese.  HENT:     Head: Normocephalic and atraumatic.     Nose: Nose normal.  Eyes:     General: No scleral icterus.    Extraocular Movements: Extraocular movements intact.  Neck:     Musculoskeletal: Normal range of motion and neck supple.     Comments: No thyromegaly present Cardiovascular:     Rate and Rhythm: Normal rate and regular rhythm.     Pulses: Normal pulses.     Heart sounds: Normal heart sounds.  Pulmonary:     Effort: Pulmonary effort is normal. No respiratory distress.     Breath sounds: Normal breath sounds.  Abdominal:     Palpations: Abdomen is soft.     Tenderness: There is no abdominal tenderness.     Comments: + Obesity  Musculoskeletal: Normal range of motion.     Right lower leg: No edema.     Left lower leg: No edema.  Skin:    General: Skin is warm and dry.  Neurological:     Mental Status: She is alert and oriented to person, place, and  time.     Coordination: Coordination normal.  Psychiatric:        Mood and Affect: Mood normal.        Behavior: Behavior normal.     RECENT LABS AND TESTS: BMET    Component Value Date/Time   NA 137 05/08/2019 0000   K 3.8 05/08/2019 0000   CL 100 05/08/2019 0000   CO2 23 05/08/2019 0000   GLUCOSE 95 05/08/2019 0000   GLUCOSE 111 (H) 10/31/2017 0610   BUN 6 05/08/2019 0000   CREATININE 0.80 05/08/2019 0000   CALCIUM 8.7 05/08/2019 0000   GFRNONAA 101 05/08/2019 0000   GFRAA 117 05/08/2019 0000   Lab Results  Component Value Date   HGBA1C 5.3 05/08/2019   Lab Results  Component Value Date   INSULIN 32.8 (H) 05/08/2019   CBC    Component Value Date/Time   WBC 5.0 05/08/2019 0000   WBC 4.2 10/31/2017 0610   RBC 4.84 05/08/2019 0000   RBC 4.44 10/31/2017 0610   HGB 12.0 05/08/2019 0000   HCT 36.9 05/08/2019 0000   PLT 171 10/31/2017 0610   MCV 76 (L) 05/08/2019 0000   MCH 24.8 (L) 05/08/2019 0000   MCH 26.4 10/31/2017 0610   MCHC 32.5 05/08/2019 0000   MCHC 34.9 10/31/2017 0610   RDW 18.3 (H) 05/08/2019 0000   LYMPHSABS 1.7 05/08/2019 0000   MONOABS 0.5 10/31/2017 0610   EOSABS 0.0 05/08/2019 0000   BASOSABS 0.0 05/08/2019 0000   Iron/TIBC/Ferritin/ %Sat No results found for: IRON, TIBC, FERRITIN, IRONPCTSAT Lipid Panel     Component Value Date/Time   CHOL 193 05/08/2019 0000   TRIG 80 05/08/2019 0000   HDL 69 05/08/2019 0000   LDLCALC 108 (H) 05/08/2019 0000   Hepatic Function Panel     Component Value Date/Time   PROT 9.0 (H) 05/08/2019 0000   ALBUMIN  4.0 05/08/2019 0000   AST 44 (H) 05/08/2019 0000   ALT 51 (H) 05/08/2019 0000   ALKPHOS 59 05/08/2019 0000   BILITOT 0.6 05/08/2019 0000      Component Value Date/Time   TSH 1.460 05/08/2019 0000   Vitamin D No recent labs  ECG  shows NSR with a rate of 84 BPM INDIRECT CALORIMETER done today shows a VO2 of 289 and a REE of 2013. Her calculated basal metabolic rate is [id number] thus her basal  metabolic rate is better than expected.       OBESITY BEHAVIORAL INTERVENTION VISIT  Today's visit was # 1   Starting weight: 243 lbs Starting date: 05/08/2019 Today's weight : 243 lbs Today's date: 05/08/2019 Total lbs lost to date: 0    ASK: We discussed the diagnosis of obesity with Evorn GongJessica K Omahoney today and [name] agreed to give us permission to discuss obesity behavioral modification therapy today.  ASSESS: [name] has the diagnosis of obesity and her BMI today is 43.06 [name] is in the action stage of change   ADVISE: [name] was educated on the multiple health risks of obesity as well as the benefit of weight loss to improve her health. She was advised of the need for long term treatment and the importance of lifestyle modifications to improve her current health and to decrease her risk of future health problems.  AGREE: Multiple dietary modification options and treatment options were discussed and  [name] agreed to follow the recommendations documented in the above note.  ARRANGE: [name] was educated on the importance of frequent visits to treat obesity as outlined per CMS and USPSTF guidelines and agreed to schedule her next follow up appointment today.   I, Burt KnackSharon Martin, am acting as transcriptionist for Debbra RidingAlexandria Kadolph, MD   I have reviewed the above documentation for accuracy and completeness, and I agree with the above. - Debbra RidingAlexandria Kadolph, MD

## 2019-05-15 ENCOUNTER — Other Ambulatory Visit: Payer: Self-pay

## 2019-05-15 ENCOUNTER — Ambulatory Visit (INDEPENDENT_AMBULATORY_CARE_PROVIDER_SITE_OTHER): Payer: BC Managed Care – PPO | Admitting: Psychology

## 2019-05-15 DIAGNOSIS — F330 Major depressive disorder, recurrent, mild: Secondary | ICD-10-CM | POA: Diagnosis not present
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
## 2019-05-20 NOTE — Progress Notes (Signed)
Office: [phone]  /  Fax: [phone]    Date: May 21, 2019   Appointment Start Time: 4:04pm Duration: 30 minutes Provider: Lawerance CruelGaytri Analys Ryden, Psy.D. Type of Session: Individual Therapy  Location of Patient: Home Location of Provider: Provider's Home Type of Contact: Telepsychological Visit via [employer]   Session Content: Kimberly Heath is a 28 y.o. female presenting via [employer] Web[employer] for a follow-up appointment to address the previously established treatment goal of decreasing emotional eating. Of note, this provider called Kimberly Heath to assist with connecting at 4:01pm. She explained she received a work call, but would be able to join. The e-mail was re-sent with the secure link. As such, today's appointment was initiated 4 minutes late. Today's appointment was a telepsychological visit, as this provider's clinic is seeing a limited number of patients for in-person visits due to COVID-19. Therapeutic services will resume to in-person appointments once deemed appropriate. Kimberly Heath expressed understanding regarding the rationale for telepsychological services, and provided verbal consent for today's appointment. Prior to proceeding with today's appointment, [place]'s physical location at the time of this appointment was obtained. Kimberly Heath reported she was at home and provided the address. In the event of technical difficulties, Kimberly Heath shared a phone number she could be reached at. Kimberly Heath and this provider participated in today's telepsychological service. Also, Kimberly Heath denied anyone else being present in the room or on the WebEx appointment.  This provider conducted a brief check-in and verbally administered the PHQ-9 and GAD-7. Kimberly Heath reported she is scheduled for an initial appointment based on the referral placed by this provider. She also shared she started following her structured meal plan "even more" and discussed walking after work, which has helped with mood. A risk assessment was completed.  Since the last appointment with this provider, Kimberly Heath denied experiencing suicidal and homicidal ideation, plan, and intent. She shared she has easy access to her safety plan, and continues to express understanding regarding the importance of utilizing emergency resources and/or reaching out to trusted individuals. In addition, Kimberly Heath expressed desire to join a grief group specific for women who lost a child. This provider reported she would conduct some research to see if there are any local groups. This provider also shared a [employer] resource shared by Dr. Rinaldo RatelKadolph. Moreover, Kimberly Heath reported she looked at the handout shared by this provider on one occasion, but did not reference it during the remainder of the week. She denied episodes of emotional eating since the onset of treatment with this provider, which she attributed to personal goals. She also discussed speaking with her spiritual mentor and wrote in her journal. Remainder of today's session focused further on discussing emotional and physical hunger. Further psychoeducation regarding emotional versus physical hunger was provided to increase awareness of hunger patterns and subsequent eating. Kimberly Heath was receptive to today's session as evidenced by openness to sharing, responsiveness to feedback, and willingness to explore hunger patterns.  Mental Status Examination:  Appearance: neat Behavior: cooperative Mood: euthymic Affect: mood congruent Speech: normal in rate, volume, and tone Eye Contact: appropriate Psychomotor Activity: appropriate Thought Process: linear, logical, and goal directed  Content/Perceptual Disturbances: denies suicidal and homicidal ideation, plan, and intent and no hallucinations, delusions, bizarre thinking or behavior reported or observed Orientation: time, person, place and purpose of appointment Cognition/Sensorium: memory, attention, language, and fund of knowledge intact  Insight: good Judgment:  good  Structured Assessment Results: The Patient Health Questionnaire-9 (PHQ-9) is a self-report measure that assesses symptoms and severity of depression over the course of the last  two weeks; however, today's administration was based on the past week. Sherria obtained a score of 4 suggesting minimal depression. Yasamin finds the endorsed symptoms to be somewhat difficult. Little interest or pleasure in doing things 0  Feeling down, depressed, or hopeless 0  Trouble falling or staying asleep, or sleeping too much 1  Feeling tired or having little energy 0  Poor appetite or overeating 3  Feeling bad about yourself --- or that you are a failure or have let yourself or your family down 0  Trouble concentrating on things, such as reading the newspaper or watching television 0  Moving or speaking so slowly that other people could have noticed? Or the opposite --- being so fidgety or restless that you have been moving around a lot more than usual 0  Thoughts that you would be better off dead or hurting yourself in some way 0  PHQ-9 Score 4    The Generalized Anxiety Disorder-7 (GAD-7) is a brief self-report measure that assesses symptoms of anxiety over the course of the last two weeks; however, today's administration was based on the past week. Takeysha obtained a score of 2 suggesting minimal anxiety. Dequita finds the endorsed symptoms to be somewhat difficult. Feeling nervous, anxious, on edge 0  Not being able to stop or control worrying 0  Worrying too much about different things 2  Trouble relaxing 0  Being so restless that it's hard to sit still 0  Becoming easily annoyed or irritable 0  Feeling afraid as if something awful might happen 0  GAD-7 Score 2   Interventions:  Conducted a brief chart review Verbal administration of PHQ-9 and GAD-7 for symptom monitoring Provided empathic reflections and validation Conducted a risk assessment Psychoeducation provided regarding physical versus  emotional hunger Focused on rapport building Employed supportive psychotherapy interventions to facilitate reduced distress, and to improve coping skills with identified stressors  DSM-5 Diagnosis: 296.31 (F33.0) Major Depressive Disorder, Recurrent Episode, Mild, With Anxious Distress  Treatment Goal & Progress: During the initial appointment with this provider, the following treatment goal was established: decrease emotional eating. Progress is limited, as Fawna has just begun treatment with this provider; however, she is receptive to the interaction and interventions and rapport is being established.   Plan: Indya continues to appear able and willing to participate as evidenced by engagement in reciprocal conversation, and asking questions for clarification as appropriate. Due to the upcoming holiday and limited availability of appointments, this provider discussed the option of an appointment in 1 week and alternatively in 3 weeks. Keydi reported a desire to schedule an appointment in 3 weeks as it will allow her time to accrue PTO to attend the appointment. As such, the next appointment will be scheduled in three weeks, which will be via [employer]. She expressed understanding that she may call this provider's office and request an appointment earlier if needed and there is availability. She also was reminded to utilize emergency resources if needed. Maize acknowledged understanding. Once this provider's office resumes in-person appointments and it is deemed appropriate, Vanecia will be notified. The next session will focus on discussing triggers for emotional eating, as they were not introduced during today's appointment.

## 2019-05-21 ENCOUNTER — Ambulatory Visit (INDEPENDENT_AMBULATORY_CARE_PROVIDER_SITE_OTHER): Payer: BC Managed Care – PPO | Admitting: Psychology

## 2019-05-21 DIAGNOSIS — F330 Major depressive disorder, recurrent, mild: Secondary | ICD-10-CM

## 2019-05-22 ENCOUNTER — Encounter (INDEPENDENT_AMBULATORY_CARE_PROVIDER_SITE_OTHER): Payer: Self-pay | Admitting: Family Medicine

## 2019-05-22 ENCOUNTER — Other Ambulatory Visit: Payer: Self-pay

## 2019-05-22 ENCOUNTER — Ambulatory Visit (INDEPENDENT_AMBULATORY_CARE_PROVIDER_SITE_OTHER): Payer: BC Managed Care – PPO | Admitting: Family Medicine

## 2019-05-22 VITALS — BP 124/81 | HR 98 | Temp 98.8°F | Ht 63.0 in | Wt 243.0 lb

## 2019-05-22 DIAGNOSIS — Z9189 Other specified personal risk factors, not elsewhere classified: Secondary | ICD-10-CM

## 2019-05-22 DIAGNOSIS — E8881 Metabolic syndrome: Secondary | ICD-10-CM | POA: Diagnosis not present

## 2019-05-22 DIAGNOSIS — Z6841 Body Mass Index (BMI) 40.0 and over, adult: Secondary | ICD-10-CM

## 2019-05-22 DIAGNOSIS — E559 Vitamin D deficiency, unspecified: Secondary | ICD-10-CM

## 2019-05-22 DIAGNOSIS — I10 Essential (primary) hypertension: Secondary | ICD-10-CM

## 2019-05-22 MED ORDER — VITAMIN D (ERGOCALCIFEROL) 1.25 MG (50000 UNIT) PO CAPS: 50000.0000 [IU] | ORAL_CAPSULE | ORAL | 0 refills | Status: DC

## 2019-05-26 NOTE — Progress Notes (Signed)
Office: [phone]  /  Fax: [phone]   HPI:   Chief Complaint: OBESITY Kimberly Heath is here to discuss her progress with her obesity treatment plan. She is on the Category 3 plan and is following her eating plan approximately 50 % of the time. She states she is walking for 30-+45 minutes 7 times per week. Kimberly Heath voices frustration with lack of weight loss. She is struggling with breakfast and is not always following the plan. She is not getting in cottage cheese or yogurt at lunch. She is doing trail mix for snack and not doing dinner on the plan.  Her weight is 243 lb (110.2 kg) today and has not lost weight since her last visit. She has lost 0 lbs since starting treatment with us.  Vitamin D Deficiency Kimberly Heath has a diagnosis of vitamin D deficiency. She is on OTC Vit D 5,000 IU daily. She notes fatigue and denies nausea, vomiting or muscle weakness.  Insulin Resistance Kimberly Heath has a diagnosis of insulin resistance based on her elevated fasting insulin level >5. Insulin leve of 32.8 and Hgb A1c of 5.3. Although Shanyah's blood glucose readings are still under good control, insulin resistance puts her at greater risk of metabolic syndrome and diabetes. She is not taking metformin currently and notes carbohydrate cravings for rice and dried fruit. She continues to work on diet and exercise to decrease risk of diabetes.  At risk for diabetes Kimberly Heath is at higher than average risk for developing diabetes due to her obesity and insulin resistance. She currently denies polyuria or polydipsia.  Hyperlipidemia Kimberly Heath has hyperlipidemia and has been trying to improve her cholesterol levels with intensive lifestyle modification including a low saturated fat diet, exercise and weight loss. Last LDL was of 108 and HDL of 69. She is not on statin and denies any chest pain, claudication or myalgias.  Microcytic Anemia Kimberly Heath has a diagnosis of anemia. She is not taking iron supplementation and she  has a history of iron deficiency.  ASSESSMENT AND PLAN:  Vitamin D deficiency - Plan: Vitamin D, Ergocalciferol, (DRISDOL) 1.25 MG (50000 UT) CAPS capsule  Essential hypertension  Insulin resistance  At high risk for osteoporosis  Class 3 severe obesity with serious comorbidity and body mass index (BMI) of 40.0 to 44.9 in adult, unspecified obesity type (HCC)  PLAN:  Vitamin D Deficiency Kimberly Heath was informed that low vitamin D levels contributes to fatigue and are associated with obesity, breast, and colon cancer. Kimberly Heath agrees to stop OTC Vit D, and she agrees to start prescription Vit D 50,000 IU every week #4 with no refills. She will follow up for routine testing of vitamin D, at least 2-3 times per year. She was informed of the risk of over-replacement of vitamin D and agrees to not increase her dose unless she discusses this with us first. Kimberly Heath agrees to follow up with our clinic in 2 weeks.  Insulin Resistance Kimberly Heath will continue to work on weight loss, exercise, and decreasing simple carbohydrates in her diet to help decrease the risk of diabetes. We dicussed metformin including benefits and risks. She was informed that eating too many simple carbohydrates or too many calories at one sitting increases the likelihood of GI side effects. Kimberly Heath declined metformin for now and prescription was not written today. We will repeat Hgb A1c and insulin in 3 months. Kimberly Heath agrees to follow up with our clinic in 2 weeks as directed to monitor her progress.  Diabetes risk counseling Kimberly Heath was given extended (  30 minutes) diabetes prevention counseling today. She is 28 y.o. female and has risk factors for diabetes including obesity and insulin resistance. We discussed intensive lifestyle modifications today with an emphasis on weight loss as well as increasing exercise and decreasing simple carbohydrates in her diet.  Hyperlipidemia Kimberly Heath was informed of the American Heart Association  Guidelines emphasizing intensive lifestyle modifications as the first line treatment for hyperlipidemia. We discussed many lifestyle modifications today in depth, and Kimberly Heath will continue to work on decreasing saturated fats such as fatty red meat, butter and many fried foods. She will also increase vegetables and lean protein in her diet and continue to work on exercise and weight loss efforts. We will repeat FLP in 3 months. Kimberly Heath agrees to follow up with our clinic in 2 weeks.  Microcytic Anemia The diagnosis of microcytic anemia was discussed with Kimberly Heath and was explained in detail. She was given suggestions of iron rich foods and she is to start OTC iron supplement. Kimberly Heath agrees to follow up with our clinic in 2 weeks.   Obesity Kimberly Heath is currently in the action stage of change. As such, her goal is to continue with weight loss efforts She has agreed to follow the Category 3 plan Kimberly Heath has been instructed to work up to a goal of 150 minutes of combined cardio and strengthening exercise per week for weight loss and overall health benefits. We discussed the following Behavioral Modification Strategies today: increasing lean protein intake, increasing vegetables and work on meal planning and easy cooking plans, keeping healthy foods in the home, better snacking choices, and planning for success   Kimberly Heath has agreed to follow up with our clinic in 2 weeks. She was informed of the importance of frequent follow up visits to maximize her success with intensive lifestyle modifications for her multiple health conditions.  ALLERGIES: Allergies  Allergen Reactions  . Mushroom Extract Complex Other (See Comments)    Flu like symptoms     MEDICATIONS: Current Outpatient Medications on File Prior to Visit  Medication Sig Dispense Refill  . acetaminophen (TYLENOL) 500 MG tablet Take 2 tablets (1,000 mg total) by mouth every 6 (six) hours as needed. 30 tablet 0  . amLODipine (NORVASC) 10 MG  tablet Take 10 mg by mouth daily.  0  . carvedilol (COREG) 25 MG tablet Take 25 mg by mouth 2 (two) times daily with a meal.    . Cholecalciferol (VITAMIN D3) 5000 units CAPS Take 5,000 Units by mouth.    . cyclobenzaprine (FLEXERIL) 10 MG tablet Take 10 mg by mouth daily as needed for muscle spasms.  1  . diclofenac (VOLTAREN) 75 MG EC tablet Take 75 mg by mouth daily as needed for pain.  0  . diphenhydrAMINE (BENADRYL) 25 MG tablet Take 1 tablet (25 mg total) by mouth every 6 (six) hours. 20 tablet 0  . Ferrous Sulfate 27 MG TABS Take by mouth.    . furosemide (LASIX) 20 MG tablet Take 20 mg by mouth daily.    . hydrochlorothiazide (HYDRODIURIL) 25 MG tablet Take 25 mg by mouth daily.    . Melatonin 10 MG TABS Take 10 mg by mouth daily.    . metoCLOPramide (REGLAN) 10 MG tablet Take 1 tablet (10 mg total) by mouth every 6 (six) hours as needed for nausea (nausea/headache). Take with 25 mg of Benadryl and 1000 mg of acetaminophen. 20 tablet 0  . minocycline (DYNACIN) 50 MG tablet Take 50 mg by mouth 2 (two) times daily.  1  . potassium chloride (K-DUR) 10 MEQ tablet Take 10 mEq by mouth daily.     No current facility-administered medications on file prior to visit.     PAST MEDICAL HISTORY: Past Medical History:  Diagnosis Date  . Anxiety   . Back pain   . Chest pain   . Depression   . Edema, lower extremity   . Fluid collection (edema) in the arms, legs, hands and feet   . Hypertension   . Pericardial effusion   . Sleep apnea   . SOB (shortness of breath)     PAST SURGICAL HISTORY: History reviewed. No pertinent surgical history.  SOCIAL HISTORY: Social History   Tobacco Use  . Smoking status: Never Smoker  . Smokeless tobacco: Never Used  Substance Use Topics  . Alcohol use: Yes    Comment: occ  . Drug use: No    FAMILY HISTORY: Family History  Problem Relation Age of Onset  . High blood pressure Father     ROS: Review of Systems  Constitutional: Positive for  malaise/fatigue. Negative for weight loss.  Cardiovascular: Negative for chest pain and claudication.  Gastrointestinal: Negative for nausea and vomiting.  Genitourinary: Negative for frequency.  Musculoskeletal: Negative for myalgias.       Negative muscle weakness  Endo/Heme/Allergies: Negative for polydipsia.    PHYSICAL EXAM: Blood pressure 124/81, pulse 98, temperature 98.8 F (37.1 C), height 5\' 3"  (1.6 m), weight 243 lb (110.2 kg), SpO2 97 %. Body mass index is 43.05 kg/m. Physical Exam Vitals signs reviewed.  Constitutional:      Appearance: Normal appearance. She is obese.  Cardiovascular:     Rate and Rhythm: Normal rate.     Pulses: Normal pulses.  Pulmonary:     Effort: Pulmonary effort is normal.     Breath sounds: Normal breath sounds.  Musculoskeletal: Normal range of motion.  Skin:    General: Skin is warm and dry.  Neurological:     Mental Status: She is alert and oriented to person, place, and time.  Psychiatric:        Mood and Affect: Mood normal.        Behavior: Behavior normal.     RECENT LABS AND TESTS: BMET    Component Value Date/Time   NA 137 05/08/2019 0000   K 3.8 05/08/2019 0000   CL 100 05/08/2019 0000   CO2 23 05/08/2019 0000   GLUCOSE 95 05/08/2019 0000   GLUCOSE 111 (H) 10/31/2017 0610   BUN 6 05/08/2019 0000   CREATININE 0.80 05/08/2019 0000   CALCIUM 8.7 05/08/2019 0000   GFRNONAA 101 05/08/2019 0000   GFRAA 117 05/08/2019 0000   Lab Results  Component Value Date   HGBA1C 5.3 05/08/2019   Lab Results  Component Value Date   INSULIN 32.8 (H) 05/08/2019   CBC    Component Value Date/Time   WBC 5.0 05/08/2019 0000   WBC 4.2 10/31/2017 0610   RBC 4.84 05/08/2019 0000   RBC 4.44 10/31/2017 0610   HGB 12.0 05/08/2019 0000   HCT 36.9 05/08/2019 0000   PLT 171 10/31/2017 0610   MCV 76 (L) 05/08/2019 0000   MCH 24.8 (L) 05/08/2019 0000   MCH 26.4 10/31/2017 0610   MCHC 32.5 05/08/2019 0000   MCHC 34.9 10/31/2017 0610    RDW 18.3 (H) 05/08/2019 0000   LYMPHSABS 1.7 05/08/2019 0000   MONOABS 0.5 10/31/2017 0610   EOSABS 0.0 05/08/2019 0000   BASOSABS 0.0 05/08/2019 0000  Iron/TIBC/Ferritin/ %Sat No results found for: IRON, TIBC, FERRITIN, IRONPCTSAT Lipid Panel     Component Value Date/Time   CHOL 193 05/08/2019 0000   TRIG 80 05/08/2019 0000   HDL 69 05/08/2019 0000   LDLCALC 108 (H) 05/08/2019 0000   Hepatic Function Panel     Component Value Date/Time   PROT 9.0 (H) 05/08/2019 0000   ALBUMIN 4.0 05/08/2019 0000   AST 44 (H) 05/08/2019 0000   ALT 51 (H) 05/08/2019 0000   ALKPHOS 59 05/08/2019 0000   BILITOT 0.6 05/08/2019 0000      Component Value Date/Time   TSH 1.460 05/08/2019 0000      OBESITY BEHAVIORAL INTERVENTION VISIT  Today's visit was # 2   Starting weight: 243 lbs Starting date: 05/08/2019 Today's weight : 243 lbs Today's date: 05/22/2019 Total lbs lost to date: 0    ASK: We discussed the diagnosis of obesity with Thea Alken today and Takirah agreed to give [place] permission to discuss obesity behavioral modification therapy today.  ASSESS: Raelie has the diagnosis of obesity and her BMI today is 43.06 Agnes is in the action stage of change   ADVISE: Maizee was educated on the multiple health risks of obesity as well as the benefit of weight loss to improve her health. She was advised of the need for long term treatment and the importance of lifestyle modifications to improve her current health and to decrease her risk of future health problems.  AGREE: Multiple dietary modification options and treatment options were discussed and  Nakya agreed to follow the recommendations documented in the above note.  ARRANGE: Hadessah was educated on the importance of frequent visits to treat obesity as outlined per CMS and USPSTF guidelines and agreed to schedule her next follow up appointment today.  I, Trixie Dredge, am acting as transcriptionist for Ilene Qua, MD  I have reviewed the above documentation for accuracy and completeness, and I agree with the above. - Ilene Qua, MD

## 2019-06-04 NOTE — Progress Notes (Addendum)
Office: [phone]  /  Fax: [phone]    Date: June 10, 2019   Appointment Start Time: 4:13pm Duration: 31 minutes Provider: Glennie Isle, Psy.D. Type of Session: Individual Therapy  Location of Patient: Home Location of Provider: [facility] Type of Contact: Telepsychological Visit via [employer] Web[employer]   Session Content: Kimberly Heath is a 28 y.o. female presenting via [place] for a follow-up appointment to address the previously established treatment goal of decreasing emotional eating. Of note, this provider called Kimberly Heath at 4:03pm as she did not present for today's appointment. A HIPAA compliant voicemail was left requesting a call back. She called this provider's office back at 4:08pm and explained she was busy with work and would be able to sign on. This provider explained today's appointment may not be for the full duration due to the late start. She acknowledged understanding and consented to proceed. As such, today's appointment was initiated 13 minutes late.Today's appointment was a telepsychological visit, as this provider's clinic is seeing a limited number of patients for in-person visits due to COVID-19. Therapeutic services will resume to in-person appointments once deemed appropriate. Kimberly Heath expressed understanding regarding the rationale for telepsychological services, and provided verbal consent for today's appointment. Prior to proceeding with today's appointment, Kimberly Heath's physical location at the time of this appointment was obtained. Kimberly Heath reported she was at home and provided the address. In the event of technical difficulties, Kimberly Heath shared a phone number she could be reached at. Kimberly Heath and this provider participated in today's telepsychological service. Also, Kimberly Heath denied anyone else being present in the room or on the WebEx appointment.  This provider conducted a brief check-in and verbally administered the PHQ-9 and GAD-7. Kimberly Heath stated an  increase in emotional eating since the last appointment with this provider and noted an increase in fatigue. This was explored further. Kimberly Heath shared she was thinking about her "whole journey" with her son and "events leading up to it." She also discussed worry about the future as it relates to future pregnancies. Kimberly Heath shared she has an appointment scheduled based on the referral placed and discussed feeling "anxious" due to uncertainty about the "outcome" as she has met with mental health providers in the past. This was explored and she acknowledged feeling "stuck" where she is in life and she feels she "should" be in a better position. Associated thoughts and feelings were processed. In addition, a risk assessment was completed at the beginning and end of today's appointment based on Kimberly Heath's presenting concerns. Kimberly Heath denied experiencing suicidal and homicidal ideation, plan, and intent since the last appointment with this provider. She reported she continues to have easy access to the developed safety plan, and continues to acknowledge understanding regarding the importance of reaching out to trusted individuals and/or emergency resources if she is unable to ensure safety. However, she endorsed question 9 on the PHQ-9. She explained, "I just think about my son and how I should have died that day or both of [place], not just him." She described experiencing survivor's guilt and denied endorsing the item due to suicidal ideation. At the end of today's appointment, Kimberly Heath again denied suicidal ideation, plan, and intent and noted, "I'm okay."   Notably, Kimberly Heath reported an increase in alcohol use and shared, "I probably drink throughout the day." She noted she began drinking more regularly the during 4th of July weekend. She indicated she did not drink today and yesterday. The amount was explored. Kimberly Heath noted consuming mixed drinks as well as shots.  Kimberly Heath shared she would start drinking around 2pm and stop  around 11pm. She indicated she "space[s] it out;" therefore, is not "Kimberly Heath" throughout the day. However, she stated she will be "Kimberly Heath" at night and "go to bed." She denied any consequences of alcohol use and does not believe it is impacting work. Currently, Kimberly Heath stated she feels "good" as she did not consume alcohol. She indicated a plan to get rid of all alcohol in her place and she noted, "Once it is gone, I'm not buying any more alcohol." Initially, she planned to drink it with someone else, but added, "The best thing for me to do is give it to my sister and tell her to keep it." Kimberly Heath agreed to give it to her sister today.    Regarding the meal plan, Kimberly Heath stated, "I pick and choose. I don't feel I'm following it how it is supposed to be followed." It was recommended she discuss the meal plan further with Kimberly Heath. She agreed. Furthermore, psychoeducation regarding triggers for emotional eating was provided. Kimberly Heath was provided a handout, and encouraged to utilize the handout between now and the next appointment to increase awareness of triggers and frequency. Kimberly Heath agreed. This provider also discussed behavioral strategies for specific triggers, such as placing the utensil down when conversing to avoid mindless eating. Kimberly Heath provided verbal consent during today's appointment for this provider to send the handout via e-mail. Overall, Kimberly Heath was receptive to today's session as evidenced by openness to sharing, responsiveness to feedback, and willingness to explore triggers for emotional eating.  Mental Status Examination:  Appearance: neat Behavior: cooperative Mood: euthymic Affect: mood congruent Speech: normal in rate, volume, and tone Eye Contact: appropriate Psychomotor Activity: appropriate Thought Process: linear, logical, and goal directed  Content/Perceptual Disturbances: denies suicidal and homicidal ideation, plan, and intent and no hallucinations, delusions, bizarre  thinking or behavior reported or observed Orientation: time, person, place and purpose of appointment Cognition/Sensorium: memory, attention, language, and fund of knowledge intact  Insight: good Judgment: good  Structured Assessment Results: The Patient Health Questionnaire-9 (PHQ-9) is a self-report measure that assesses symptoms and severity of depression over the course of the last two weeks. Kimberly Heath obtained a score of 19 suggesting moderately severe depression. Kimberly Heath Heath the endorsed symptoms to be somewhat difficult. Kimberly Heath interest or pleasure in doing things 3  Feeling down, depressed, or hopeless 3  Trouble falling or staying asleep, or sleeping too much 2  Feeling tired or having Kimberly Heath energy 3  Poor appetite or overeating 3  Feeling bad about yourself --- or that you are a failure or have let yourself or your family down 3  Trouble concentrating on things, such as reading the newspaper or watching television 1  Moving or speaking so slowly that other people could have noticed? Or the opposite --- being so fidgety or restless that you have been moving around a lot more than usual 0  Thoughts that you would be better off dead or hurting yourself in some way 1  PHQ-9 Score 19    The Generalized Anxiety Disorder-7 (GAD-7) is a brief self-report measure that assesses symptoms of anxiety over the course of the last two weeks. Kimberly Heath obtained a score of 10 suggesting moderate anxiety. Kimberly Heath the endorsed symptoms to be very difficult. Feeling nervous, anxious, on edge 2  Not being able to stop or control worrying 3  Worrying too much about different things 3  Trouble relaxing 2  Being so restless that it's hard  to sit still 0  Becoming easily annoyed or irritable 0  Feeling afraid as if something awful might happen 0  GAD-7 Score 10   Interventions:  Conducted a brief chart review Verbal administration of PHQ-9 and GAD-7 for symptom monitoring Provided empathic  reflections and validation Conducted a risk assessment Processed thoughts and feelings Psychoeducation provided regarding triggers for emotional eating Employed supportive psychotherapy interventions to facilitate reduced distress, and to improve coping skills with identified stressors  DSM-5 Diagnosis: 296.31 (F33.0) Major Depressive Disorder, Recurrent Episode, Mild, With Anxious Distress  Treatment Goal & Progress: During the initial appointment with this provider, the following treatment goal was established: decrease emotional eating. Kimberly Heath has demonstrated some progress in her goal as evidenced by increased awareness of hunger patterns and willingness to identify her triggers for emotional eating.   Plan: Kimberly Heath continues to appear able and willing to participate as evidenced by engagement in reciprocal conversation, and asking questions for clarification as appropriate. The next appointment will be scheduled in two weeks, which will be via [employer]. Once this provider's office resumes in-person appointments and it is deemed appropriate, Kimberly Heath will be notified. The next session will focus on reviewing triggers for emotional eating and the introduction of mindfulness. This provider will also continue to assess safety as well as Kimberly Heath's alcohol use.

## 2019-06-10 ENCOUNTER — Other Ambulatory Visit: Payer: Self-pay

## 2019-06-10 ENCOUNTER — Ambulatory Visit (INDEPENDENT_AMBULATORY_CARE_PROVIDER_SITE_OTHER): Payer: BC Managed Care – PPO | Admitting: Psychology

## 2019-06-10 DIAGNOSIS — F330 Major depressive disorder, recurrent, mild: Secondary | ICD-10-CM | POA: Diagnosis not present

## 2019-06-11 ENCOUNTER — Other Ambulatory Visit: Payer: Self-pay

## 2019-06-11 ENCOUNTER — Encounter (INDEPENDENT_AMBULATORY_CARE_PROVIDER_SITE_OTHER): Payer: Self-pay | Admitting: Family Medicine

## 2019-06-11 ENCOUNTER — Ambulatory Visit (INDEPENDENT_AMBULATORY_CARE_PROVIDER_SITE_OTHER): Payer: BC Managed Care – PPO | Admitting: Family Medicine

## 2019-06-11 VITALS — BP 124/75 | HR 86 | Temp 98.5°F | Ht 63.0 in | Wt 241.0 lb

## 2019-06-11 DIAGNOSIS — E8881 Metabolic syndrome: Secondary | ICD-10-CM

## 2019-06-11 DIAGNOSIS — Z6841 Body Mass Index (BMI) 40.0 and over, adult: Secondary | ICD-10-CM

## 2019-06-11 NOTE — Progress Notes (Signed)
Office: [phone]  /  Fax: [phone]   HPI:   Chief Complaint: OBESITY Kimberly Heath is here to discuss her progress with her obesity treatment plan. She is on the  follow the Category 3 plan and is following her eating plan approximately 50 % of the time. She states she is walking 30 to 40 minutes 3 times per week. Kimberly Heath did well with weight loss over the last two weeks, but she is struggling to follow her plan closely. Hunger is controlled, but she isn't doing well with meal planning and prepping, and she is still eating out often. Her weight is 241 lb (109.3 kg) today and has had a weight loss of 2 pounds over a period of 3 weeks since her last visit. She has lost 2 lbs since starting treatment with us.  Insulin Resistance Kimberly Heath has a diagnosis of insulin resistance based on her elevated fasting insulin level >5. Although Brynja's blood glucose readings are still under good control, insulin resistance puts her at greater risk of metabolic syndrome and diabetes. Kimberly Heath is not taking medications currently and she continues to work on diet and exercise to decrease risk of diabetes. Kimberly Heath has questions about her diagnosis of insulin resistance, and what she can eat. She is scared to eat anything sweet, even if it is lower in sugar.  ASSESSMENT AND PLAN:  Insulin resistance  Class 3 severe obesity with serious comorbidity and body mass index (BMI) of 40.0 to 44.9 in adult, unspecified obesity type (HCC)  PLAN:  Insulin Resistance Kimberly Heath was educated on simple vs. Complex carbohydrates, and how this affects blood sugar and insulin, as well as the importance of lean protein. Handout was also given to patient today.Kimberly Heath will continue to work on weight loss, exercise, and decreasing simple carbohydrates in her diet to help decrease the risk of diabetes. She was informed that eating too many simple carbohydrates or too many calories at one sitting increases the likelihood of GI side  effects. Kimberly Heath agreed to follow up with us as directed to monitor her progress.  I spent > than 50% of the 25 minute visit on counseling as documented in the note.  Obesity Kimberly Heath is currently in the action stage of change. As such, her goal is to continue with weight loss efforts She has agreed to keep a food journal with 1300 to 1600 calories and 85+ grams of protein daily Kimberly Heath has been instructed to work up to a goal of 150 minutes of combined cardio and strengthening exercise per week for weight loss and overall health benefits. We discussed the following Behavioral Modification Strategies today: keep a strict food journal, increasing lean protein intake, decreasing simple carbohydrates, decrease eating out and work on meal planning and easy cooking plans  Kimberly Heath has agreed to follow up with our clinic in 2 weeks. She was informed of the importance of frequent follow up visits to maximize her success with intensive lifestyle modifications for her multiple health conditions.  ALLERGIES: Allergies  Allergen Reactions   Mushroom Extract Complex Other (See Comments)    Flu like symptoms     MEDICATIONS: Current Outpatient Medications on File Prior to Visit  Medication Sig Dispense Refill   acetaminophen (TYLENOL) 500 MG tablet Take 2 tablets (1,000 mg total) by mouth every 6 (six) hours as needed. 30 tablet 0   amLODipine (NORVASC) 10 MG tablet Take 10 mg by mouth daily.  0   carvedilol (COREG) 25 MG tablet Take 25 mg by mouth 2 (two)  times daily with a meal.     Cholecalciferol (VITAMIN D3) 5000 units CAPS Take 5,000 Units by mouth.     cyclobenzaprine (FLEXERIL) 10 MG tablet Take 10 mg by mouth daily as needed for muscle spasms.  1   diclofenac (VOLTAREN) 75 MG EC tablet Take 75 mg by mouth daily as needed for pain.  0   diphenhydrAMINE (BENADRYL) 25 MG tablet Take 1 tablet (25 mg total) by mouth every 6 (six) hours. 20 tablet 0   Ferrous Sulfate 27 MG TABS Take by  mouth.     furosemide (LASIX) 20 MG tablet Take 20 mg by mouth daily.     hydrochlorothiazide (HYDRODIURIL) 25 MG tablet Take 25 mg by mouth daily.     Melatonin 10 MG TABS Take 10 mg by mouth daily.     metoCLOPramide (REGLAN) 10 MG tablet Take 1 tablet (10 mg total) by mouth every 6 (six) hours as needed for nausea (nausea/headache). Take with 25 mg of Benadryl and 1000 mg of acetaminophen. 20 tablet 0   minocycline (DYNACIN) 50 MG tablet Take 50 mg by mouth 2 (two) times daily.  1   potassium chloride (K-DUR) 10 MEQ tablet Take 10 mEq by mouth daily.     Vitamin D, Ergocalciferol, (DRISDOL) 1.25 MG (50000 UT) CAPS capsule Take 1 capsule (50,000 Units total) by mouth every 7 (seven) days. 4 capsule 0   No current facility-administered medications on file prior to visit.     PAST MEDICAL HISTORY: Past Medical History:  Diagnosis Date   Anxiety    Back pain    Chest pain    Depression    Edema, lower extremity    Fluid collection (edema) in the arms, legs, hands and feet    Hypertension    Pericardial effusion    Sleep apnea    SOB (shortness of breath)     PAST SURGICAL HISTORY: History reviewed. No pertinent surgical history.  SOCIAL HISTORY: Social History   Tobacco Use   Smoking status: Never Smoker   Smokeless tobacco: Never Used  Substance Use Topics   Alcohol use: Yes    Comment: occ   Drug use: No    FAMILY HISTORY: Family History  Problem Relation Age of Onset   High blood pressure Father     ROS: Review of Systems  Constitutional: Positive for weight loss.    PHYSICAL EXAM: Blood pressure 124/75, pulse 86, temperature 98.5 F (36.9 C), temperature source Oral, height 5\' 3"  (1.6 m), weight 241 lb (109.3 kg), last menstrual period 06/03/2019, SpO2 99 %. Body mass index is 42.69 kg/m. Physical Exam Vitals signs reviewed.  Constitutional:      Appearance: Normal appearance. She is well-developed. She is obese.  Cardiovascular:       Rate and Rhythm: Normal rate.  Pulmonary:     Effort: Pulmonary effort is normal.  Musculoskeletal: Normal range of motion.  Skin:    General: Skin is warm and dry.  Neurological:     Mental Status: She is alert and oriented to person, place, and time.  Psychiatric:        Mood and Affect: Mood normal.        Behavior: Behavior normal.     RECENT LABS AND TESTS: BMET    Component Value Date/Time   NA 137 05/08/2019 0000   K 3.8 05/08/2019 0000   CL 100 05/08/2019 0000   CO2 23 05/08/2019 0000   GLUCOSE 95 05/08/2019 0000   GLUCOSE  111 (H) 10/31/2017 0610   BUN 6 05/08/2019 0000   CREATININE 0.80 05/08/2019 0000   CALCIUM 8.7 05/08/2019 0000   GFRNONAA 101 05/08/2019 0000   GFRAA 117 05/08/2019 0000   Lab Results  Component Value Date   HGBA1C 5.3 05/08/2019   Lab Results  Component Value Date   INSULIN 32.8 (H) 05/08/2019   CBC    Component Value Date/Time   WBC 5.0 05/08/2019 0000   WBC 4.2 10/31/2017 0610   RBC 4.84 05/08/2019 0000   RBC 4.44 10/31/2017 0610   HGB 12.0 05/08/2019 0000   HCT 36.9 05/08/2019 0000   PLT 171 10/31/2017 0610   MCV 76 (L) 05/08/2019 0000   MCH 24.8 (L) 05/08/2019 0000   MCH 26.4 10/31/2017 0610   MCHC 32.5 05/08/2019 0000   MCHC 34.9 10/31/2017 0610   RDW 18.3 (H) 05/08/2019 0000   LYMPHSABS 1.7 05/08/2019 0000   MONOABS 0.5 10/31/2017 0610   EOSABS 0.0 05/08/2019 0000   BASOSABS 0.0 05/08/2019 0000   Iron/TIBC/Ferritin/ %Sat No results found for: IRON, TIBC, FERRITIN, IRONPCTSAT Lipid Panel     Component Value Date/Time   CHOL 193 05/08/2019 0000   TRIG 80 05/08/2019 0000   HDL 69 05/08/2019 0000   LDLCALC 108 (H) 05/08/2019 0000   Hepatic Function Panel     Component Value Date/Time   PROT 9.0 (H) 05/08/2019 0000   ALBUMIN 4.0 05/08/2019 0000   AST 44 (H) 05/08/2019 0000   ALT 51 (H) 05/08/2019 0000   ALKPHOS 59 05/08/2019 0000   BILITOT 0.6 05/08/2019 0000      Component Value Date/Time   TSH 1.460  05/08/2019 0000    Results for Evorn GongBANNIE, Danaye K (MRN [id number]) as of 06/11/2019 14:15  Ref. Range 05/08/2019 00:00  Vitamin D, 25-Hydroxy Latest Ref Range: 30.0 - 100.0 ng/mL 24.7 (L)    OBESITY BEHAVIORAL INTERVENTION VISIT  Today's visit was # 3   Starting weight: 243 lbs Starting date: 05/08/2019 Today's weight : 241 lbs Today's date: 06/11/2019 Total lbs lost to date: 2    06/11/2019  Height 5\' 3"  (1.6 m)  Weight 241 lb (109.3 kg)  BMI (Calculated) 42.7  BLOOD PRESSURE - SYSTOLIC 124  BLOOD PRESSURE - DIASTOLIC 75   Body Fat % 45.6 %  Total Body Water (lbs) 95.2 lbs    ASK: We discussed the diagnosis of obesity with Evorn GongJessica K Heath today and Kimberly Heath agreed to give us permission to discuss obesity behavioral modification therapy today.  ASSESS: Kimberly Heath has the diagnosis of obesity and her BMI today is 42.7 Kimberly Heath is in the action stage of change   ADVISE: Kimberly Heath was educated on the multiple health risks of obesity as well as the benefit of weight loss to improve her health. She was advised of the need for long term treatment and the importance of lifestyle modifications to improve her current health and to decrease her risk of future health problems.  AGREE: Multiple dietary modification options and treatment options were discussed and  Kimberly Heath agreed to follow the recommendations documented in the above note.  ARRANGE: Kimberly Heath was educated on the importance of frequent visits to treat obesity as outlined per CMS and USPSTF guidelines and agreed to schedule her next follow up appointment today.  I, [place] CraneJoanne Murray, am acting as transcriptionist for Quillian Quincearen Cissy Galbreath, MD  I have reviewed the above documentation for accuracy and completeness, and I agree with the above. -Quillian Quincearen Mistee Soliman, MD

## 2019-06-17 ENCOUNTER — Ambulatory Visit: Payer: BC Managed Care – PPO | Admitting: Psychology

## 2019-06-21 ENCOUNTER — Ambulatory Visit (INDEPENDENT_AMBULATORY_CARE_PROVIDER_SITE_OTHER): Payer: BC Managed Care – PPO | Admitting: Psychology

## 2019-06-21 DIAGNOSIS — Z634 Disappearance and death of family member: Secondary | ICD-10-CM | POA: Diagnosis not present

## 2019-06-21 DIAGNOSIS — F331 Major depressive disorder, recurrent, moderate: Secondary | ICD-10-CM

## 2019-06-25 ENCOUNTER — Ambulatory Visit (INDEPENDENT_AMBULATORY_CARE_PROVIDER_SITE_OTHER): Payer: BC Managed Care – PPO | Admitting: Psychology

## 2019-06-25 ENCOUNTER — Other Ambulatory Visit: Payer: Self-pay

## 2019-06-25 DIAGNOSIS — F330 Major depressive disorder, recurrent, mild: Secondary | ICD-10-CM

## 2019-06-25 NOTE — Progress Notes (Signed)
Office: [phone]  /  Fax: [phone]    Date: June 25, 2019   Appointment Start Time: 4:03pm Duration: 24 minutes Provider: Glennie Isle, Psy.D. Type of Session: Individual Therapy  Location of Patient: Home Location of Provider: Provider's Home Type of Contact: Telepsychological Visit via [employer] WebEx   Session Content: Kimberly Heath is a 28 y.o. female presenting via [place] for a follow-up appointment to address the previously established treatment goal of decreasing emotional eating. Of note, this provider called Kimberly Heath at 4:02pm, as she did not present for today's appointment. She indicated she was looking for the e-mail sent by this provider. This provider re-sent the e-mail with the secure link for today's appointment. As such, today's appointment was initiated 3 minutes late. Today's appointment was a telepsychological visit, as this provider's clinic is seeing a limited number of patients for in-person visits due to COVID-19. Therapeutic services will resume to in-person appointments once deemed appropriate. Shawnna expressed understanding regarding the rationale for telepsychological services, and provided verbal consent for today's appointment. Prior to proceeding with today's appointment, Kimberly Heath's physical location at the time of this appointment was obtained. Kimberly Heath reported she was at home and provided the address. In the event of technical difficulties, Kimberly Heath shared a phone number she could be reached at. Kimberly Heath and this provider participated in today's telepsychological service. Also, Kimberly Heath denied anyone else being present in the room or on the WebEx appointment.  This provider conducted a brief check-in and verbally administered the PHQ-9 and GAD-7. Kimberly Heath stated, "I don't think I have done anything good as far as like health decisions." This was explored. Kimberly Heath noted she has not been walking and she added, "I haven't been emotionally eating like I was before, but  I'll eat one big meal or two big meals." However, she noted she has implemented discussed strategies. This was positively reinforced. She endorsed feeling hopeless and explained, "Just feeling like I'm stuck" as it relates to life and weight loss. She also discussed ongoing worry about work, her relationship, and finances. A risk assessment was completed. Kimberly Heath denied experiencing suicidal and homicidal ideation, plan, and intent since the last appointment with this provider. She reported she continues to have easy access to the developed safety plan, and continues to acknowledge understanding regarding the importance of reaching out to trusted individuals and/or emergency resources if she is unable to ensure safety. This provider also assessed Fay's recent alcohol use. Kimberly Heath noted, "I haven't been drinking." She stated she followed the established plan and dropped the alcohol off at her sister's house. In addition, this provider checked-in about her recent appointment with her new behavioral health provider. Kimberly Heath stated, "I like her. It was really good." She shared the focus of treatment will "be mainly grief." Kimberly Heath stated their next appointment is June 27, 2019. Due to her work schedule, Kimberly Heath will be terminating services with this provider and will continue with her new behavioral health provider. Due to Kimberly Heath's self-report of her recent eating habits (e.g., one or two meals a day), psychoeducation regarding the hunger and satisfaction scale was provided. Kimberly Heath provided verbal consent during today's appointment for this provider to send the handout for the scale via e-mail. Kimberly Heath was receptive to today's session as evidenced by openness to sharing, responsiveness to feedback, and willingness to implement discussed strategies.  Mental Status Examination:  Appearance: neat Behavior: cooperative Mood: euthymic Affect: mood congruent Speech: normal in rate, volume, and tone Eye Contact:  appropriate Psychomotor Activity: appropriate Thought Process: linear, logical,  and goal directed  Content/Perceptual Disturbances: denies suicidal and homicidal ideation, plan, and intent and no hallucinations, delusions, bizarre thinking or behavior reported or observed Orientation: time, person, place and purpose of appointment Cognition/Sensorium: memory, attention, language, and fund of knowledge intact  Insight: fair Judgment: fair  Structured Assessment Results: The Patient Health Questionnaire-9 (PHQ-9) is a self-report measure that assesses symptoms and severity of depression over the course of the last two weeks. Kimberly Heath obtained a score of 14 suggesting moderate depression. Kimberly Heath finds the endorsed symptoms to be very difficult. Little interest or pleasure in doing things 3  Feeling down, depressed, or hopeless 2  Trouble falling or staying asleep, or sleeping too much 3  Feeling tired or having little energy 3  Poor appetite or overeating 1  Feeling bad about yourself --- or that you are a failure or have let yourself or your family down 1  Trouble concentrating on things, such as reading the newspaper or watching television 1  Moving or speaking so slowly that other people could have noticed? Or the opposite --- being so fidgety or restless that you have been moving around a lot more than usual 0  Thoughts that you would be better off dead or hurting yourself in some way 0  PHQ-9 Score 14    The Generalized Anxiety Disorder-7 (GAD-7) is a brief self-report measure that assesses symptoms of anxiety over the course of the last two weeks. Kimberly Heath obtained a score of 4 suggesting minimal anxiety. Kimberly Heath finds the endorsed symptoms to be somewhat difficult. Feeling nervous, anxious, on edge 1  Not being able to stop or control worrying 1  Worrying too much about different things 1  Trouble relaxing 0  Being so restless that it's hard to sit still 1  Becoming easily annoyed or  irritable 0  Feeling afraid as if something awful might happen 0  GAD-7 Score 4   Interventions:  Conducted a brief chart review Verbal administration of PHQ-9 and GAD-7 for symptom monitoring Provided empathic reflections and validation Reviewed content from the previous session Conducted a risk assessment Provided positive reinforcement Psychoeducation provided regarding the hunger and satisfaction scale Employed supportive psychotherapy interventions to facilitate reduced distress, and to improve coping skills with identified stressors  DSM-5 Diagnosis: 296.31 (F33.0) Major Depressive Disorder, Recurrent Episode, Mild, With Anxious Distress  Treatment Goal & Progress: During the initial appointment with this provider, the following treatment goal was established: decrease emotional eating. Kimberly Heath has demonstrated some progress in her goal as evidenced by increased awareness of hunger patterns and triggers for emotional eating.   Plan: Today was Wylene's last appointment with this provider. She will continue with her new behavioral health provider and their next appointment is scheduled for June 27, 2019.

## 2019-06-26 ENCOUNTER — Ambulatory Visit (INDEPENDENT_AMBULATORY_CARE_PROVIDER_SITE_OTHER): Payer: BC Managed Care – PPO | Admitting: Family Medicine

## 2019-06-26 ENCOUNTER — Other Ambulatory Visit: Payer: Self-pay

## 2019-06-26 VITALS — BP 119/75 | HR 86 | Temp 98.5°F | Ht 63.0 in | Wt 242.0 lb

## 2019-06-26 DIAGNOSIS — E559 Vitamin D deficiency, unspecified: Secondary | ICD-10-CM

## 2019-06-26 DIAGNOSIS — E8881 Metabolic syndrome: Secondary | ICD-10-CM

## 2019-06-26 DIAGNOSIS — Z6841 Body Mass Index (BMI) 40.0 and over, adult: Secondary | ICD-10-CM | POA: Diagnosis not present

## 2019-06-26 NOTE — Progress Notes (Signed)
Office: [phone]  /  Fax: [phone]   HPI:   Chief Complaint: OBESITY Kimberly Heath is here to discuss her progress with her obesity treatment plan. She is on the Category 3 plan and is following her eating plan approximately 30% of the time. She states she is exercising 0 minutes 0 times per week. Kimberly Heath voices the last few weeks have been less productive - hasn't followed the plan very closely and hasn't exercised. She voices the plan isn't hard; she just has to muster motivation to do it. [place] is [place] go [place] in [place].  Her weight is 242 lb (109.8 kg) today and has had a weight gain of 1 lb since her last visit. She has lost 1 lb since starting treatment with us.  Insulin Resistance Kimberly Heath has a diagnosis of insulin resistance based on her elevated fasting insulin level >5. Her A1c was 5.3 on 05/08/2019 and insulin was 32.8. Although Kimberly Heath's blood glucose readings are still under good control, insulin resistance puts her at greater risk of metabolic syndrome and diabetes. She is not taking metformin currently and continues to work on diet and exercise to decrease risk of diabetes.  Vitamin D deficiency Kimberly Heath has a diagnosis of Vitamin D deficiency. She is currently taking prescription Vit D and denies nausea, vomiting or muscle weakness but does report fatigue.  ASSESSMENT AND PLAN:  Insulin resistance  Vitamin D deficiency  Class 3 severe obesity with serious comorbidity and body mass index (BMI) of 40.0 to 44.9 in adult, unspecified obesity type (HCC)  PLAN:  Insulin Resistance Kimberly Heath will continue to work on weight loss, exercise, and decreasing simple carbohydrates in her diet to help decrease the risk of diabetes. We dicussed metformin including benefits and risks. She was informed that eating too many simple carbohydrates or too many calories at one sitting increases the likelihood of GI side effects. Kimberly Heath will continue the Category 3 plan; will have  repeat labs in early to mid September. She will follow-up with us as directed to monitor her progress.  Vitamin D Deficiency Kimberly Heath was informed that low Vitamin D levels contributes to fatigue and are associated with obesity, breast, and colon cancer. She agrees to continue taking prescription Vit D (no refill needed) and will follow-up for routine testing of Vitamin D, at least 2-3 times per year. She was informed of the risk of over-replacement of Vitamin D and agrees to not increase her dose unless she discusses this with us first. Kimberly Heath agrees to follow-up with our clinic in 2 weeks.  Obesity Kimberly Heath is currently in the action stage of change. As such, her goal is to continue with weight loss efforts. She has agreed to follow the Category 3 plan. Kimberly Heath has been instructed to work up to a goal of 150 minutes of combined cardio and strengthening exercise per week for weight loss and overall health benefits. We discussed the following Behavioral Modification Strategies today: increasing lean protein intake, increasing vegetables, work on meal planning and easy cooking plans, keeping healthy foods in the home, and planning for success.  Kimberly Heath has agreed to follow-up with our clinic in 2 weeks. She was informed of the importance of frequent follow-up visits to maximize her success with intensive lifestyle modifications for her multiple health conditions.  ALLERGIES: Allergies  Allergen Reactions  . Mushroom Extract Complex Other (See Comments)    Flu like symptoms     MEDICATIONS: Current Outpatient Medications on File Prior to Visit  Medication Sig Dispense  Refill  . acetaminophen (TYLENOL) 500 MG tablet Take 2 tablets (1,000 mg total) by mouth every 6 (six) hours as needed. 30 tablet 0  . amLODipine (NORVASC) 10 MG tablet Take 10 mg by mouth daily.  0  . carvedilol (COREG) 25 MG tablet Take 25 mg by mouth 2 (two) times daily with a meal.    . Cholecalciferol (VITAMIN D3) 5000  units CAPS Take 5,000 Units by mouth.    . cyclobenzaprine (FLEXERIL) 10 MG tablet Take 10 mg by mouth daily as needed for muscle spasms.  1  . diclofenac (VOLTAREN) 75 MG EC tablet Take 75 mg by mouth daily as needed for pain.  0  . diphenhydrAMINE (BENADRYL) 25 MG tablet Take 1 tablet (25 mg total) by mouth every 6 (six) hours. 20 tablet 0  . Ferrous Sulfate 27 MG TABS Take by mouth.    . furosemide (LASIX) 20 MG tablet Take 20 mg by mouth daily.    . hydrochlorothiazide (HYDRODIURIL) 25 MG tablet Take 25 mg by mouth daily.    . Melatonin 10 MG TABS Take 10 mg by mouth daily.    . metoCLOPramide (REGLAN) 10 MG tablet Take 1 tablet (10 mg total) by mouth every 6 (six) hours as needed for nausea (nausea/headache). Take with 25 mg of Benadryl and 1000 mg of acetaminophen. 20 tablet 0  . minocycline (DYNACIN) 50 MG tablet Take 50 mg by mouth 2 (two) times daily.  1  . potassium chloride (K-DUR) 10 MEQ tablet Take 10 mEq by mouth daily.    . Vitamin D, Ergocalciferol, (DRISDOL) 1.25 MG (50000 UT) CAPS capsule Take 1 capsule (50,000 Units total) by mouth every 7 (seven) days. 4 capsule 0   No current facility-administered medications on file prior to visit.     PAST MEDICAL HISTORY: Past Medical History:  Diagnosis Date  . Anxiety   . Back pain   . Chest pain   . Depression   . Edema, lower extremity   . Fluid collection (edema) in the arms, legs, hands and feet   . Hypertension   . Pericardial effusion   . Sleep apnea   . SOB (shortness of breath)     PAST SURGICAL HISTORY: No past surgical history on file.  SOCIAL HISTORY: Social History   Tobacco Use  . Smoking status: Never Smoker  . Smokeless tobacco: Never Used  Substance Use Topics  . Alcohol use: Yes    Comment: occ  . Drug use: No    FAMILY HISTORY: Family History  Problem Relation Age of Onset  . High blood pressure Father    ROS: Review of Systems  Constitutional: Positive for malaise/fatigue.   Gastrointestinal: Negative for nausea and vomiting.  Musculoskeletal:       Negative for muscle weakness.   PHYSICAL EXAM: Blood pressure 119/75, pulse 86, temperature 98.5 F (36.9 C), temperature source Oral, height 5\' 3"  (1.6 m), weight 242 lb (109.8 kg), last menstrual period 06/03/2019, SpO2 99 %. Body mass index is 42.87 kg/m. Physical Exam Vitals signs reviewed.  Constitutional:      Appearance: Normal appearance. She is obese.  Cardiovascular:     Rate and Rhythm: Normal rate.     Pulses: Normal pulses.  Pulmonary:     Effort: Pulmonary effort is normal.     Breath sounds: Normal breath sounds.  Musculoskeletal: Normal range of motion.  Skin:    General: Skin is warm and dry.  Neurological:     Mental Status:  She is alert and oriented to person, place, and time.  Psychiatric:        Behavior: Behavior normal.   RECENT LABS AND TESTS: BMET    Component Value Date/Time   NA 137 05/08/2019 0000   K 3.8 05/08/2019 0000   CL 100 05/08/2019 0000   CO2 23 05/08/2019 0000   GLUCOSE 95 05/08/2019 0000   GLUCOSE 111 (H) 10/31/2017 0610   BUN 6 05/08/2019 0000   CREATININE 0.80 05/08/2019 0000   CALCIUM 8.7 05/08/2019 0000   GFRNONAA 101 05/08/2019 0000   GFRAA 117 05/08/2019 0000   Lab Results  Component Value Date   HGBA1C 5.3 05/08/2019   Lab Results  Component Value Date   INSULIN 32.8 (H) 05/08/2019   CBC    Component Value Date/Time   WBC 5.0 05/08/2019 0000   WBC 4.2 10/31/2017 0610   RBC 4.84 05/08/2019 0000   RBC 4.44 10/31/2017 0610   HGB 12.0 05/08/2019 0000   HCT 36.9 05/08/2019 0000   PLT 171 10/31/2017 0610   MCV 76 (L) 05/08/2019 0000   MCH 24.8 (L) 05/08/2019 0000   MCH 26.4 10/31/2017 0610   MCHC 32.5 05/08/2019 0000   MCHC 34.9 10/31/2017 0610   RDW 18.3 (H) 05/08/2019 0000   LYMPHSABS 1.7 05/08/2019 0000   MONOABS 0.5 10/31/2017 0610   EOSABS 0.0 05/08/2019 0000   BASOSABS 0.0 05/08/2019 0000   Iron/TIBC/Ferritin/ %Sat No  results found for: IRON, TIBC, FERRITIN, IRONPCTSAT Lipid Panel     Component Value Date/Time   CHOL 193 05/08/2019 0000   TRIG 80 05/08/2019 0000   HDL 69 05/08/2019 0000   LDLCALC 108 (H) 05/08/2019 0000   Hepatic Function Panel     Component Value Date/Time   PROT 9.0 (H) 05/08/2019 0000   ALBUMIN 4.0 05/08/2019 0000   AST 44 (H) 05/08/2019 0000   ALT 51 (H) 05/08/2019 0000   ALKPHOS 59 05/08/2019 0000   BILITOT 0.6 05/08/2019 0000      Component Value Date/Time   TSH 1.460 05/08/2019 0000   Results for Kimberly GongBANNIE, Markiesha K (MRN [id number]) as of 06/26/2019 11:03  Ref. Range 05/08/2019 00:00  Vitamin D, 25-Hydroxy Latest Ref Range: 30.0 - 100.0 ng/mL 24.7 (L)   OBESITY BEHAVIORAL INTERVENTION VISIT  Today's visit was #4  Starting weight: 243 lbs Starting date: 05/08/2019 Today's weight: 242 lbs  Today's date: 06/26/2019 Total lbs lost to date: 1   06/26/2019  Height 5\' 3"  (1.6 m)  Weight 242 lb (109.8 kg)  BMI (Calculated) 42.88  BLOOD PRESSURE - SYSTOLIC 119  BLOOD PRESSURE - DIASTOLIC 75   Body Fat % 46 %  Total Body Water (lbs) 96.6 lbs   ASK: We discussed the diagnosis of obesity with Kimberly Heath today and Kimberly Heath agreed to give us permission to discuss obesity behavioral modification therapy today.  ASSESS: Kimberly Heath has the diagnosis of obesity and her BMI today is 43.0. Kimberly Heath is in the action stage of change.   ADVISE: Kimberly Heath was educated on the multiple health risks of obesity as well as the benefit of weight loss to improve her health. She was advised of the need for long term treatment and the importance of lifestyle modifications to improve her current health and to decrease her risk of future health problems.  AGREE: Multiple dietary modification options and treatment options were discussed and  Kimberly Heath agreed to follow the recommendations documented in the above note.  ARRANGE: Kimberly Heath was educated on the  importance of frequent visits to treat  obesity as outlined per CMS and USPSTF guidelines and agreed to schedule her next follow up appointment today.  I, Marianna Paymentenise Haag, am acting as transcriptionist for Debbra RidingAlexandria Kadolph, MD  I have reviewed the above documentation for accuracy and completeness, and I agree with the above. - Debbra RidingAlexandria Kadolph, MD

## 2019-06-27 ENCOUNTER — Ambulatory Visit: Payer: BC Managed Care – PPO | Admitting: Psychology

## 2019-06-28 ENCOUNTER — Ambulatory Visit (INDEPENDENT_AMBULATORY_CARE_PROVIDER_SITE_OTHER): Payer: BC Managed Care – PPO | Admitting: Psychology

## 2019-06-28 DIAGNOSIS — Z634 Disappearance and death of family member: Secondary | ICD-10-CM | POA: Diagnosis not present

## 2019-06-28 DIAGNOSIS — F331 Major depressive disorder, recurrent, moderate: Secondary | ICD-10-CM

## 2019-07-15 ENCOUNTER — Ambulatory Visit: Payer: BC Managed Care – PPO | Admitting: Psychology

## 2019-07-16 ENCOUNTER — Other Ambulatory Visit: Payer: Self-pay

## 2019-07-16 ENCOUNTER — Ambulatory Visit (INDEPENDENT_AMBULATORY_CARE_PROVIDER_SITE_OTHER): Payer: BC Managed Care – PPO | Admitting: Family Medicine

## 2019-07-16 ENCOUNTER — Encounter (INDEPENDENT_AMBULATORY_CARE_PROVIDER_SITE_OTHER): Payer: Self-pay | Admitting: Family Medicine

## 2019-07-16 VITALS — BP 135/83 | HR 85 | Temp 98.3°F | Ht 63.0 in | Wt 241.0 lb

## 2019-07-16 DIAGNOSIS — Z9189 Other specified personal risk factors, not elsewhere classified: Secondary | ICD-10-CM

## 2019-07-16 DIAGNOSIS — L708 Other acne: Secondary | ICD-10-CM

## 2019-07-16 DIAGNOSIS — E8881 Metabolic syndrome: Secondary | ICD-10-CM

## 2019-07-16 DIAGNOSIS — E66813 Obesity, class 3: Secondary | ICD-10-CM

## 2019-07-16 DIAGNOSIS — E559 Vitamin D deficiency, unspecified: Secondary | ICD-10-CM | POA: Diagnosis not present

## 2019-07-16 DIAGNOSIS — E88819 Insulin resistance, unspecified: Secondary | ICD-10-CM

## 2019-07-16 DIAGNOSIS — Z6841 Body Mass Index (BMI) 40.0 and over, adult: Secondary | ICD-10-CM

## 2019-07-16 MED ORDER — MINOCYCLINE HCL 50 MG PO TABS: 50.0000 mg | ORAL_TABLET | Freq: Two times a day (BID) | ORAL | 0 refills | Status: DC

## 2019-07-16 MED ORDER — VITAMIN D (ERGOCALCIFEROL) 1.25 MG (50000 UNIT) PO CAPS: 50000.0000 [IU] | ORAL_CAPSULE | ORAL | 0 refills | Status: DC

## 2019-07-16 NOTE — Progress Notes (Signed)
Office: [phone]  /  Fax: [phone]   HPI:   Chief Complaint: OBESITY Kimberly Heath is here to discuss her progress with her obesity treatment plan. She is on the  follow the Category 3 plan and is following her eating plan approximately 80 % of the time. She states she is exercising by walking for 60 minutes 1 times per week. Kimberly Heath voices she has been following plan more than previously. Denies hunger. She is getting in more vegetables that meat at dinner (not weighing). [place] is [place] to [place] next [place].  Her weight is 241 lb (109.3 kg) today and has had a weight loss of 1 pounds over a period of 2 weeks since her last visit. She has lost 2 lbs since starting treatment with us.  Insulin Resistance Kimberly Heath has a diagnosis of insulin resistance based on her elevated fasting insulin level of 32.8 and HgA1c of 5.3. Although Markayla's blood glucose readings are still under good control, insulin resistance puts her at greater risk of metabolic syndrome and diabetes. She is not taking metformin currently and continues to work on diet and exercise to decrease risk of diabetes. She admits to carbohydrate cravings.   Vitamin D deficiency Kimberly Heath has a diagnosis of vitamin D deficiency. She is currently taking vit D and denies nausea, vomiting or muscle weakness. She reports fatigue.   Acne Kimberly Heath is taking minocycline BID for acne. Her symptoms are controlled with minocycline.   At risk for osteopenia and osteoporosis Kimberly Heath is at higher risk of osteopenia and osteoporosis due to vitamin D deficiency.   ASSESSMENT AND PLAN:  Insulin resistance  Vitamin D deficiency - Plan: Vitamin D, Ergocalciferol, (DRISDOL) 1.25 MG (50000 UT) CAPS capsule  Other acne - Plan: minocycline (DYNACIN) 50 MG tablet  At risk for osteoporosis  Class 3 severe obesity with serious comorbidity and body mass index (BMI) of 40.0 to 44.9 in adult, unspecified obesity type (HCC)  PLAN: Insulin  Resistance Kimberly Heath will continue to work on weight loss, exercise, and decreasing simple carbohydrates in her diet to help decrease the risk of diabetes. We dicussed metformin including benefits and risks. She was informed that eating too many simple carbohydrates or too many calories at one sitting increases the likelihood of GI side effects. Kimberly Heath agrees to continue Category 3 plan and we will repeat las in late Sept. Kimberly Heath agreed to follow up with us as directed to monitor her progress.  Vitamin D Deficiency Kimberly Heath was informed that low vitamin D levels contributes to fatigue and are associated with obesity, breast, and colon cancer. She agrees to continue to take prescription Vit D @50 ,000 IU every week #4 with no refills and will follow up for routine testing of vitamin D, at least 2-3 times per year. She was informed of the risk of over-replacement of vitamin D and agrees to not increase her dose unless she discusses this with us first. Agrees to follow up with our clinic as directed.   Acne Discussed acne with Kimberly Heath. She agrees to continue taking minocycline 50 mg BID #60 with no refills. Agrees to follow up with our clinic as directed.   At risk for osteopenia and osteoporosis Kimberly Heath was given extended  (15 minutes) osteoporosis prevention counseling today. Kimberly Heath is at risk for osteopenia and osteoporosis due to her vitamin D deficiency. She was encouraged to take her vitamin D and follow her higher calcium diet and increase strengthening exercise to help strengthen her bones and decrease her risk  of osteopenia and osteoporosis.  Obesity Kimberly Heath is currently in the action stage of change. As such, her goal is to continue with weight loss efforts She has agreed to follow the Category 3 plan Kimberly Heath has been instructed to work up to a goal of 150 minutes of combined cardio and strengthening exercise per week for weight loss and overall health benefits. We discussed the following  Behavioral Modification Strategies today:planning for success, keeping healthy foods in the home,  increasing lean protein intake, increasing vegetables and work on meal planning and easy cooking plans   Kimberly Heath has agreed to follow up with our clinic in 3 weeks. She was informed of the importance of frequent follow up visits to maximize her success with intensive lifestyle modifications for her multiple health conditions.  ALLERGIES: Allergies  Allergen Reactions  . Mushroom Extract Complex Other (See Comments)    Flu like symptoms     MEDICATIONS: Current Outpatient Medications on File Prior to Visit  Medication Sig Dispense Refill  . acetaminophen (TYLENOL) 500 MG tablet Take 2 tablets (1,000 mg total) by mouth every 6 (six) hours as needed. 30 tablet 0  . amLODipine (NORVASC) 10 MG tablet Take 10 mg by mouth daily.  0  . carvedilol (COREG) 25 MG tablet Take 25 mg by mouth 2 (two) times daily with a meal.    . Cholecalciferol (VITAMIN D3) 5000 units CAPS Take 5,000 Units by mouth.    . cyclobenzaprine (FLEXERIL) 10 MG tablet Take 10 mg by mouth daily as needed for muscle spasms.  1  . diclofenac (VOLTAREN) 75 MG EC tablet Take 75 mg by mouth daily as needed for pain.  0  . diphenhydrAMINE (BENADRYL) 25 MG tablet Take 1 tablet (25 mg total) by mouth every 6 (six) hours. 20 tablet 0  . Ferrous Sulfate 27 MG TABS Take by mouth.    . furosemide (LASIX) 20 MG tablet Take 20 mg by mouth daily.    . hydrochlorothiazide (HYDRODIURIL) 25 MG tablet Take 25 mg by mouth daily.    . Melatonin 10 MG TABS Take 10 mg by mouth daily.    . metoCLOPramide (REGLAN) 10 MG tablet Take 1 tablet (10 mg total) by mouth every 6 (six) hours as needed for nausea (nausea/headache). Take with 25 mg of Benadryl and 1000 mg of acetaminophen. 20 tablet 0  . potassium chloride (K-DUR) 10 MEQ tablet Take 10 mEq by mouth daily.     No current facility-administered medications on file prior to visit.     PAST MEDICAL  HISTORY: Past Medical History:  Diagnosis Date  . Anxiety   . Back pain   . Chest pain   . Depression   . Edema, lower extremity   . Fluid collection (edema) in the arms, legs, hands and feet   . Hypertension   . Pericardial effusion   . Sleep apnea   . SOB (shortness of breath)     PAST SURGICAL HISTORY: History reviewed. No pertinent surgical history.  SOCIAL HISTORY: Social History   Tobacco Use  . Smoking status: Never Smoker  . Smokeless tobacco: Never Used  Substance Use Topics  . Alcohol use: Yes    Comment: occ  . Drug use: No    FAMILY HISTORY: Family History  Problem Relation Age of Onset  . High blood pressure Father     ROS: Review of Systems  Constitutional: Positive for malaise/fatigue and weight loss.  Gastrointestinal: Negative for nausea and vomiting.  Musculoskeletal:  Negative for muscle weakness    PHYSICAL EXAM: Blood pressure 135/83, pulse 85, temperature 98.3 F (36.8 C), temperature source Oral, height 5\' 3"  (1.6 m), weight 241 lb (109.3 kg), last menstrual period 06/28/2019, SpO2 97 %. Body mass index is 42.69 kg/m. Physical Exam Vitals signs reviewed.  Constitutional:      Appearance: Normal appearance. She is obese.  HENT:     Head: Normocephalic.     Nose: Nose normal.  Neck:     Musculoskeletal: Normal range of motion.  Cardiovascular:     Rate and Rhythm: Normal rate.  Pulmonary:     Effort: Pulmonary effort is normal.  Musculoskeletal: Normal range of motion.  Skin:    General: Skin is warm and dry.  Neurological:     Mental Status: She is oriented to person, place, and time.  Psychiatric:        Mood and Affect: Mood normal.        Behavior: Behavior normal.     RECENT LABS AND TESTS: BMET    Component Value Date/Time   NA 137 05/08/2019 0000   K 3.8 05/08/2019 0000   CL 100 05/08/2019 0000   CO2 23 05/08/2019 0000   GLUCOSE 95 05/08/2019 0000   GLUCOSE 111 (H) 10/31/2017 0610   BUN 6 05/08/2019  0000   CREATININE 0.80 05/08/2019 0000   CALCIUM 8.7 05/08/2019 0000   GFRNONAA 101 05/08/2019 0000   GFRAA 117 05/08/2019 0000   Lab Results  Component Value Date   HGBA1C 5.3 05/08/2019   Lab Results  Component Value Date   INSULIN 32.8 (H) 05/08/2019   CBC    Component Value Date/Time   WBC 5.0 05/08/2019 0000   WBC 4.2 10/31/2017 0610   RBC 4.84 05/08/2019 0000   RBC 4.44 10/31/2017 0610   HGB 12.0 05/08/2019 0000   HCT 36.9 05/08/2019 0000   PLT 171 10/31/2017 0610   MCV 76 (L) 05/08/2019 0000   MCH 24.8 (L) 05/08/2019 0000   MCH 26.4 10/31/2017 0610   MCHC 32.5 05/08/2019 0000   MCHC 34.9 10/31/2017 0610   RDW 18.3 (H) 05/08/2019 0000   LYMPHSABS 1.7 05/08/2019 0000   MONOABS 0.5 10/31/2017 0610   EOSABS 0.0 05/08/2019 0000   BASOSABS 0.0 05/08/2019 0000   Iron/TIBC/Ferritin/ %Sat No results found for: IRON, TIBC, FERRITIN, IRONPCTSAT Lipid Panel     Component Value Date/Time   CHOL 193 05/08/2019 0000   TRIG 80 05/08/2019 0000   HDL 69 05/08/2019 0000   LDLCALC 108 (H) 05/08/2019 0000   Hepatic Function Panel     Component Value Date/Time   PROT 9.0 (H) 05/08/2019 0000   ALBUMIN 4.0 05/08/2019 0000   AST 44 (H) 05/08/2019 0000   ALT 51 (H) 05/08/2019 0000   ALKPHOS 59 05/08/2019 0000   BILITOT 0.6 05/08/2019 0000      Component Value Date/Time   TSH 1.460 05/08/2019 0000    Ref. Range 05/08/2019 00:00  Vitamin D, 25-Hydroxy Latest Ref Range: 30.0 - 100.0 ng/mL 24.7 (L)     OBESITY BEHAVIORAL INTERVENTION VISIT  Today's visit was # 5   Starting weight: 243 lbs Starting date: 05/08/19 Today's weight : Weight: 241 lb (109.3 kg)  Today's date: 07/16/2019 Total lbs lost to date: 2 lbs At least 15 minutes were spent on discussing the following behavioral intervention visit.   ASK: We discussed the diagnosis of obesity with Thea Alken today and Emmett agreed to give [place] permission to  discuss obesity behavioral modification therapy today.   ASSESS: Kimberly Heath has the diagnosis of obesity and her BMI today is 42.7.  Kimberly Heath is in the action stage of change   ADVISE: Kimberly Heath was educated on the multiple health risks of obesity as well as the benefit of weight loss to improve her health. She was advised of the need for long term treatment and the importance of lifestyle modifications to improve her current health and to decrease her risk of future health problems.  AGREE: Multiple dietary modification options and treatment options were discussed and  Kimberly Heath agreed to follow the recommendations documented in the above note.  ARRANGE: Kimberly Heath was educated on the importance of frequent visits to treat obesity as outlined per CMS and USPSTF guidelines and agreed to schedule her next follow up appointment today.  I, Jeralene PetersAshleigh Haynes, am acting as transcriptionist for Debbra RidingAlexandria Kadolph, MD   I have reviewed the above documentation for accuracy and completeness, and I agree with the above. - Debbra RidingAlexandria Kadolph, MD

## 2019-07-22 DIAGNOSIS — N926 Irregular menstruation, unspecified: Secondary | ICD-10-CM | POA: Diagnosis not present

## 2019-07-22 DIAGNOSIS — O1425 HELLP syndrome, complicating the puerperium: Secondary | ICD-10-CM | POA: Diagnosis not present

## 2019-07-22 DIAGNOSIS — Z8759 Personal history of other complications of pregnancy, childbirth and the puerperium: Secondary | ICD-10-CM | POA: Diagnosis not present

## 2019-07-23 DIAGNOSIS — Z1159 Encounter for screening for other viral diseases: Secondary | ICD-10-CM | POA: Diagnosis not present

## 2019-07-23 DIAGNOSIS — Z20828 Contact with and (suspected) exposure to other viral communicable diseases: Secondary | ICD-10-CM | POA: Diagnosis not present

## 2019-07-24 DIAGNOSIS — Z1159 Encounter for screening for other viral diseases: Secondary | ICD-10-CM | POA: Diagnosis not present

## 2019-07-30 ENCOUNTER — Other Ambulatory Visit: Payer: Self-pay

## 2019-07-30 ENCOUNTER — Ambulatory Visit: Payer: Self-pay | Admitting: Psychology

## 2019-07-30 ENCOUNTER — Encounter (INDEPENDENT_AMBULATORY_CARE_PROVIDER_SITE_OTHER): Payer: Self-pay | Admitting: Family Medicine

## 2019-07-30 ENCOUNTER — Ambulatory Visit (INDEPENDENT_AMBULATORY_CARE_PROVIDER_SITE_OTHER): Payer: BC Managed Care – PPO | Admitting: Family Medicine

## 2019-07-30 DIAGNOSIS — E8881 Metabolic syndrome: Secondary | ICD-10-CM

## 2019-07-30 DIAGNOSIS — Z6841 Body Mass Index (BMI) 40.0 and over, adult: Secondary | ICD-10-CM | POA: Diagnosis not present

## 2019-07-30 DIAGNOSIS — I10 Essential (primary) hypertension: Secondary | ICD-10-CM | POA: Diagnosis not present

## 2019-07-30 DIAGNOSIS — E66813 Obesity, class 3: Secondary | ICD-10-CM

## 2019-07-30 DIAGNOSIS — E88819 Insulin resistance, unspecified: Secondary | ICD-10-CM

## 2019-07-30 NOTE — Progress Notes (Signed)
Office: [phone]  /  Fax: [phone] TeleHealth Visit:  [name] has verbally consented to this TeleHealth visit today. The patient is located at home, the provider is located at the [facility] office. The participants in this visit include the listed provider and patient. The visit was conducted today via webex.  HPI:   Chief Complaint: OBESITY Kimberly Heath is here to discuss her progress with her obesity treatment plan. She is on the Category 3 plan and is following her eating plan approximately 40 % of the time. She states she is walking between 6 and 8 hours 5 days while on vacation. Kimberly Heath just returned from [place]that she visited for a hand full of days). She is planning to go to the grocery shopping this weekend. She has no plans for this upcoming weekend. She didn't weigh as she doesn't have a scale. Last reported weight was 241 lbs.  We were unable to weigh the patient today for this TeleHealth visit. She feels as if she has maintained her weight since her last visit. She has lost 2 lbs since starting treatment with [place].  Hypertension [name] is a 28 y.o. female with hypertension. [name] doesn't have any blood pressure measurements, last blood pressure was controlled. She denies chest pain. She is working on weight loss to help control her blood pressure with the goal of decreasing her risk of heart attack and stroke.   Insulin Resistance [name] has a diagnosis of insulin resistance based on her elevated fasting insulin level >5. Although [name]'s blood glucose readings are still under good control, insulin resistance puts her at greater risk of metabolic syndrome and diabetes. She notes carbohydrate cravings especially last week for alcohol. She is not taking metformin currently and continues to work on diet and exercise to decrease risk of diabetes.  ASSESSMENT AND PLAN:  Essential hypertension  Insulin resistance  Class 3  severe obesity with serious comorbidity and body mass index (BMI) of 40.0 to 44.9 in adult, unspecified obesity type ([place])  PLAN:  Hypertension We discussed sodium restriction, working on healthy weight loss, and a regular exercise program as the means to achieve improved blood pressure control. Kimberly Heath agreed with this plan and agreed to follow up as directed. We will continue to monitor her blood pressure as well as her progress with the above lifestyle modifications. Kimberly Heath continue her medications and will watch for signs of hypotension as she continues her lifestyle modifications. We will follow up on her blood pressure at her next in office appointment. Kimberly Heath agrees to follow up with our clinic in 3 weeks  Insulin Resistance Kimberly Heath will continue to work on weight loss, exercise, and decreasing simple carbohydrates in her diet to help decrease the risk of diabetes. We dicussed metformin including benefits and risks. She was informed that eating too many simple carbohydrates or too many calories at one sitting increases the likelihood of GI side effects. Kimberly Heath declined metformin for now and prescription was not written today. We will repeat labs in 1 and 1/2 months. Ariam agrees to follow up with our clinic in 3 weeks as directed to monitor her progress.  Obesity Kimberly Heath is currently in the action stage of change. As such, her goal is to continue with weight loss efforts She has agreed to follow the Category 3 plan Kimberly Heath has been instructed to work up to a goal of 150 minutes of combined cardio and strengthening exercise per week for weight loss and overall health  benefits. We discussed the following Behavioral Modification Strategies today: increasing lean protein intake, increasing vegetables and work on meal planning and easy cooking plans, keeping healthy foods in the home, and planning for success   [name] has agreed to follow up with our clinic in 3 weeks. She was informed of  the importance of frequent follow up visits to maximize her success with intensive lifestyle modifications for her multiple health conditions.  ALLERGIES: Allergies  Allergen Reactions  . Mushroom Extract Complex Other (See Comments)    Flu like symptoms     MEDICATIONS: Current Outpatient Medications on File Prior to Visit  Medication Sig Dispense Refill  . acetaminophen (TYLENOL) 500 MG tablet Take 2 tablets (1,000 mg total) by mouth every 6 (six) hours as needed. 30 tablet 0  . amLODipine (NORVASC) 10 MG tablet Take 10 mg by mouth daily.  0  . carvedilol (COREG) 25 MG tablet Take 25 mg by mouth 2 (two) times daily with a meal.    . Cholecalciferol (VITAMIN D3) 5000 units CAPS Take 5,000 Units by mouth.    . cyclobenzaprine (FLEXERIL) 10 MG tablet Take 10 mg by mouth daily as needed for muscle spasms.  1  . diclofenac (VOLTAREN) 75 MG EC tablet Take 75 mg by mouth daily as needed for pain.  0  . diphenhydrAMINE (BENADRYL) 25 MG tablet Take 1 tablet (25 mg total) by mouth every 6 (six) hours. 20 tablet 0  . Ferrous Sulfate 27 MG TABS Take by mouth.    . furosemide (LASIX) 20 MG tablet Take 20 mg by mouth daily.    . hydrochlorothiazide (HYDRODIURIL) 25 MG tablet Take 25 mg by mouth daily.    . Melatonin 10 MG TABS Take 10 mg by mouth daily.    . metoCLOPramide (REGLAN) 10 MG tablet Take 1 tablet (10 mg total) by mouth every 6 (six) hours as needed for nausea (nausea/headache). Take with 25 mg of Benadryl and 1000 mg of acetaminophen. 20 tablet 0  . minocycline (DYNACIN) 50 MG tablet Take 1 tablet (50 mg total) by mouth 2 (two) times daily. 60 tablet 0  . potassium chloride (K-DUR) 10 MEQ tablet Take 10 mEq by mouth daily.    . Vitamin D, Ergocalciferol, (DRISDOL) 1.25 MG (50000 UT) CAPS capsule Take 1 capsule (50,000 Units total) by mouth every 7 (seven) days. 4 capsule 0   No current facility-administered medications on file prior to visit.     PAST MEDICAL HISTORY: Past Medical  History:  Diagnosis Date  . Anxiety   . Back pain   . Chest pain   . Depression   . Edema, lower extremity   . Fluid collection (edema) in the arms, legs, hands and feet   . Hypertension   . Pericardial effusion   . Sleep apnea   . SOB (shortness of breath)     PAST SURGICAL HISTORY: History reviewed. No pertinent surgical history.  SOCIAL HISTORY: Social History   Tobacco Use  . Smoking status: Never Smoker  . Smokeless tobacco: Never Used  Substance Use Topics  . Alcohol use: Yes    Comment: occ  . Drug use: No    FAMILY HISTORY: Family History  Problem Relation Age of Onset  . High blood pressure Father     ROS: Review of Systems  Constitutional: Negative for weight loss.  Cardiovascular: Negative for chest pain.    PHYSICAL EXAM: Pt in no acute distress  RECENT LABS AND TESTS: BMET  Component Value Date/Time   NA 137 05/08/2019 0000   K 3.8 05/08/2019 0000   CL 100 05/08/2019 0000   CO2 23 05/08/2019 0000   GLUCOSE 95 05/08/2019 0000   GLUCOSE 111 (H) 10/31/2017 0610   BUN 6 05/08/2019 0000   CREATININE 0.80 05/08/2019 0000   CALCIUM 8.7 05/08/2019 0000   GFRNONAA 101 05/08/2019 0000   GFRAA 117 05/08/2019 0000   Lab Results  Component Value Date   HGBA1C 5.3 05/08/2019   Lab Results  Component Value Date   INSULIN 32.8 (H) 05/08/2019   CBC    Component Value Date/Time   WBC 5.0 05/08/2019 0000   WBC 4.2 10/31/2017 0610   RBC 4.84 05/08/2019 0000   RBC 4.44 10/31/2017 0610   HGB 12.0 05/08/2019 0000   HCT 36.9 05/08/2019 0000   PLT 171 10/31/2017 0610   MCV 76 (L) 05/08/2019 0000   MCH 24.8 (L) 05/08/2019 0000   MCH 26.4 10/31/2017 0610   MCHC 32.5 05/08/2019 0000   MCHC 34.9 10/31/2017 0610   RDW 18.3 (H) 05/08/2019 0000   LYMPHSABS 1.7 05/08/2019 0000   MONOABS 0.5 10/31/2017 0610   EOSABS 0.0 05/08/2019 0000   BASOSABS 0.0 05/08/2019 0000   Iron/TIBC/Ferritin/ %Sat No results found for: IRON, TIBC, FERRITIN, IRONPCTSAT  Lipid Panel     Component Value Date/Time   CHOL 193 05/08/2019 0000   TRIG 80 05/08/2019 0000   HDL 69 05/08/2019 0000   LDLCALC 108 (H) 05/08/2019 0000   Hepatic Function Panel     Component Value Date/Time   PROT 9.0 (H) 05/08/2019 0000   ALBUMIN 4.0 05/08/2019 0000   AST 44 (H) 05/08/2019 0000   ALT 51 (H) 05/08/2019 0000   ALKPHOS 59 05/08/2019 0000   BILITOT 0.6 05/08/2019 0000      Component Value Date/Time   TSH 1.460 05/08/2019 0000      I, Burt Knack, am acting as transcriptionist for Debbra Riding, MD  I have reviewed the above documentation for accuracy and completeness, and I agree with the above. - Debbra Riding, MD

## 2019-08-12 ENCOUNTER — Ambulatory Visit: Payer: BC Managed Care – PPO | Admitting: Psychology

## 2019-08-22 ENCOUNTER — Telehealth (INDEPENDENT_AMBULATORY_CARE_PROVIDER_SITE_OTHER): Payer: BC Managed Care – PPO | Admitting: Family Medicine

## 2019-08-22 ENCOUNTER — Ambulatory Visit (INDEPENDENT_AMBULATORY_CARE_PROVIDER_SITE_OTHER): Payer: BC Managed Care – PPO | Admitting: Family Medicine

## 2019-08-22 ENCOUNTER — Other Ambulatory Visit: Payer: Self-pay

## 2019-08-22 DIAGNOSIS — E559 Vitamin D deficiency, unspecified: Secondary | ICD-10-CM | POA: Diagnosis not present

## 2019-08-22 DIAGNOSIS — I10 Essential (primary) hypertension: Secondary | ICD-10-CM

## 2019-08-22 DIAGNOSIS — Z6841 Body Mass Index (BMI) 40.0 and over, adult: Secondary | ICD-10-CM | POA: Diagnosis not present

## 2019-08-22 MED ORDER — VITAMIN D (ERGOCALCIFEROL) 1.25 MG (50000 UNIT) PO CAPS: 50000.0000 [IU] | ORAL_CAPSULE | ORAL | 0 refills | Status: DC

## 2019-08-26 NOTE — Progress Notes (Signed)
Office: [phone]  /  Fax: [phone] TeleHealth Visit:  Kimberly Heath has verbally consented to this TeleHealth visit today. The patient is located at home, the provider is located at the [facility] office. The participants in this visit include the listed provider and patient. The visit was conducted today via webex.  HPI:   Chief Complaint: OBESITY Kimberly Heath is here to discuss her progress with her obesity treatment plan. She is on the Category 3 plan and is following her eating plan approximately 0 % of the time. She states she is exercising 0 minutes 0 times per week. Kimberly Heath is still at home, since coming back from [place] she has not followed the plan or exercised. She is eating sporadically around 11 am or 2 pm and later in the evening.  We were unable to weigh the patient today for this TeleHealth visit. She feels as if she has gained weight since her last visit. She has lost 2 lbs since starting treatment with [place].  Vitamin D Deficiency Kimberly Heath has a diagnosis of vitamin D deficiency. She is currently taking prescription Vit D. She notes fatigue and denies nausea, vomiting or muscle weakness.  Hypertension Kimberly Heath is a 28 y.o. female with hypertension. Kimberly Heath's systolic blood pressure was in the 130's at home. She denies chest pain, chest pressure, or headaches. She is working on weight loss to help control her blood pressure with the goal of decreasing her risk of heart attack and stroke.   ASSESSMENT AND PLAN:  Essential hypertension  Vitamin D deficiency - Plan: Vitamin D, Ergocalciferol, (DRISDOL) 1.25 MG (50000 UT) CAPS capsule  Class 3 severe obesity with serious comorbidity and body mass index (BMI) of 40.0 to 44.9 in adult, unspecified obesity type (HCC)  PLAN:  Vitamin D Deficiency Kimberly Heath was informed that low vitamin D levels contributes to fatigue and are associated with obesity, breast, and colon cancer. Kimberly Heath agrees  to continue taking prescription Vit D 50,000 IU every week #4 and we will refill for 1 month. She will follow up for routine testing of vitamin D, at least 2-3 times per year. She was informed of the risk of over-replacement of vitamin D and agrees to not increase her dose unless she discusses this with [place] first. Kimberly Heath agrees to follow up with our clinic in 3 weeks.  Hypertension We discussed sodium restriction, working on healthy weight loss, and a regular exercise program as the means to achieve improved blood pressure control. Kimberly Heath agreed with this plan and agreed to follow up as directed. We will continue to monitor her blood pressure as well as her progress with the above lifestyle modifications. Kimberly Heath agrees to continue her current medications and will watch for signs of hypotension as she continues her lifestyle modifications. Kimberly Heath agrees to follow up with our clinic in 3 weeks.  Obesity Kimberly Heath is currently in the action stage of change. As such, her goal is to continue with weight loss efforts She has agreed to follow the Category 3 plan Kimberly Heath has been instructed to work up to a goal of 150 minutes of combined cardio and strengthening exercise per week for weight loss and overall health benefits. We discussed the following Behavioral Modification Strategies today: increasing lean protein intake, increasing vegetables and work on meal planning and easy cooking plans, keeping healthy foods in the home, and planning for success   Kimberly Heath has agreed to follow up with our clinic in 3 weeks. She was informed of  the importance of frequent follow up visits to maximize her success with intensive lifestyle modifications for her multiple health conditions.  ALLERGIES: Allergies  Allergen Reactions  . Mushroom Extract Complex Other (See Comments)    Flu like symptoms     MEDICATIONS: Current Outpatient Medications on File Prior to Visit  Medication Sig Dispense Refill  .  acetaminophen (TYLENOL) 500 MG tablet Take 2 tablets (1,000 mg total) by mouth every 6 (six) hours as needed. 30 tablet 0  . amLODipine (NORVASC) 10 MG tablet Take 10 mg by mouth daily.  0  . carvedilol (COREG) 25 MG tablet Take 25 mg by mouth 2 (two) times daily with a meal.    . Cholecalciferol (VITAMIN D3) 5000 units CAPS Take 5,000 Units by mouth.    . cyclobenzaprine (FLEXERIL) 10 MG tablet Take 10 mg by mouth daily as needed for muscle spasms.  1  . diclofenac (VOLTAREN) 75 MG EC tablet Take 75 mg by mouth daily as needed for pain.  0  . diphenhydrAMINE (BENADRYL) 25 MG tablet Take 1 tablet (25 mg total) by mouth every 6 (six) hours. 20 tablet 0  . Ferrous Sulfate 27 MG TABS Take by mouth.    . furosemide (LASIX) 20 MG tablet Take 20 mg by mouth daily.    . hydrochlorothiazide (HYDRODIURIL) 25 MG tablet Take 25 mg by mouth daily.    . Melatonin 10 MG TABS Take 10 mg by mouth daily.    . metoCLOPramide (REGLAN) 10 MG tablet Take 1 tablet (10 mg total) by mouth every 6 (six) hours as needed for nausea (nausea/headache). Take with 25 mg of Benadryl and 1000 mg of acetaminophen. 20 tablet 0  . minocycline (DYNACIN) 50 MG tablet Take 1 tablet (50 mg total) by mouth 2 (two) times daily. 60 tablet 0  . potassium chloride (K-DUR) 10 MEQ tablet Take 10 mEq by mouth daily.     No current facility-administered medications on file prior to visit.     PAST MEDICAL HISTORY: Past Medical History:  Diagnosis Date  . Anxiety   . Back pain   . Chest pain   . Depression   . Edema, lower extremity   . Fluid collection (edema) in the arms, legs, hands and feet   . Hypertension   . Pericardial effusion   . Sleep apnea   . SOB (shortness of breath)     PAST SURGICAL HISTORY: No past surgical history on file.  SOCIAL HISTORY: Social History   Tobacco Use  . Smoking status: Never Smoker  . Smokeless tobacco: Never Used  Substance Use Topics  . Alcohol use: Yes    Comment: occ  . Drug use:  No    FAMILY HISTORY: Family History  Problem Relation Age of Onset  . High blood pressure Father     ROS: Review of Systems  Constitutional: Positive for malaise/fatigue. Negative for weight loss.  Cardiovascular: Negative for chest pain.       Negative chest pressure  Gastrointestinal: Negative for nausea and vomiting.  Musculoskeletal:       Negative muscle weakness  Neurological: Negative for headaches.    PHYSICAL EXAM: Pt in no acute distress  RECENT LABS AND TESTS: BMET    Component Value Date/Time   NA 137 05/08/2019 0000   K 3.8 05/08/2019 0000   CL 100 05/08/2019 0000   CO2 23 05/08/2019 0000   GLUCOSE 95 05/08/2019 0000   GLUCOSE 111 (H) 10/31/2017 0610   BUN 6  05/08/2019 0000   CREATININE 0.80 05/08/2019 0000   CALCIUM 8.7 05/08/2019 0000   GFRNONAA 101 05/08/2019 0000   GFRAA 117 05/08/2019 0000   Lab Results  Component Value Date   HGBA1C 5.3 05/08/2019   Lab Results  Component Value Date   INSULIN 32.8 (H) 05/08/2019   CBC    Component Value Date/Time   WBC 5.0 05/08/2019 0000   WBC 4.2 10/31/2017 0610   RBC 4.84 05/08/2019 0000   RBC 4.44 10/31/2017 0610   HGB 12.0 05/08/2019 0000   HCT 36.9 05/08/2019 0000   PLT 171 10/31/2017 0610   MCV 76 (L) 05/08/2019 0000   MCH 24.8 (L) 05/08/2019 0000   MCH 26.4 10/31/2017 0610   MCHC 32.5 05/08/2019 0000   MCHC 34.9 10/31/2017 0610   RDW 18.3 (H) 05/08/2019 0000   LYMPHSABS 1.7 05/08/2019 0000   MONOABS 0.5 10/31/2017 0610   EOSABS 0.0 05/08/2019 0000   BASOSABS 0.0 05/08/2019 0000   Iron/TIBC/Ferritin/ %Sat No results found for: IRON, TIBC, FERRITIN, IRONPCTSAT Lipid Panel     Component Value Date/Time   CHOL 193 05/08/2019 0000   TRIG 80 05/08/2019 0000   HDL 69 05/08/2019 0000   LDLCALC 108 (H) 05/08/2019 0000   Hepatic Function Panel     Component Value Date/Time   PROT 9.0 (H) 05/08/2019 0000   ALBUMIN 4.0 05/08/2019 0000   AST 44 (H) 05/08/2019 0000   ALT 51 (H)  05/08/2019 0000   ALKPHOS 59 05/08/2019 0000   BILITOT 0.6 05/08/2019 0000      Component Value Date/Time   TSH 1.460 05/08/2019 0000      I, Burt KnackSharon Martin, am acting as transcriptionist for Debbra RidingAlexandria Kadolph, MD  I have reviewed the above documentation for accuracy and completeness, and I agree with the above. - Debbra RidingAlexandria Kadolph, MD

## 2019-09-09 ENCOUNTER — Encounter (INDEPENDENT_AMBULATORY_CARE_PROVIDER_SITE_OTHER): Payer: Self-pay

## 2019-09-10 ENCOUNTER — Telehealth (INDEPENDENT_AMBULATORY_CARE_PROVIDER_SITE_OTHER): Payer: BC Managed Care – PPO | Admitting: Family Medicine

## 2019-09-10 ENCOUNTER — Other Ambulatory Visit: Payer: Self-pay

## 2019-09-10 DIAGNOSIS — Z6841 Body Mass Index (BMI) 40.0 and over, adult: Secondary | ICD-10-CM

## 2019-09-10 DIAGNOSIS — I10 Essential (primary) hypertension: Secondary | ICD-10-CM | POA: Diagnosis not present

## 2019-09-10 DIAGNOSIS — E559 Vitamin D deficiency, unspecified: Secondary | ICD-10-CM

## 2019-09-11 NOTE — Progress Notes (Signed)
Office: [phone]  /  Fax: [phone] TeleHealth Visit:  Kimberly GongJessica K Heath has verbally consented to this TeleHealth visit today. The patient is located at home, the provider is located at the [facility] office. The participants in this visit include the listed provider and patient. The visit was conducted today via webex.  HPI:   Chief Complaint: OBESITY Kimberly Heath is here to discuss her progress with her obesity treatment plan. She is on the Category 3 plan and is following her eating plan approximately 30 % of the time. She states she is exercising 0 minutes 0 times per week. Kimberly Heath voices that she has been emotionally eating more, secondary to upcoming birthday of her late son and has increased stress at work. She is emotionally eting mostly fast food. She plans to get out a bit more to get better control of her emotional state of mind.  We were unable to weigh the patient today for this TeleHealth visit. She feels as if she has maintained her weight since her last visit. She has lost 2 lbs since starting treatment with us.  Hypertension Kimberly Heath is a 28 y.o. female with hypertension. [name]'s blood pressure was 161/112 at home on 09/05/2019. She states at work she is having some headaches, but was out of her blood pressure medications for about 2-3 weeks. She is working on weight loss to help control her blood pressure with the goal of decreasing her risk of heart attack and stroke.   Vitamin D Deficiency Kimberly Heath has a diagnosis of vitamin D deficiency. She is currently taking prescription Vit D. She notes fatigue and denies nausea, vomiting or muscle weakness.  ASSESSMENT AND PLAN:  Essential hypertension  Vitamin D deficiency  Class 3 severe obesity with serious comorbidity and body mass index (BMI) of 40.0 to 44.9 in adult, unspecified obesity type (HCC)  PLAN:  Hypertension We discussed sodium restriction, working on healthy weight loss, and a  regular exercise program as the means to achieve improved blood pressure control. Kimberly Heath agreed with this plan and agreed to follow up as directed. We will continue to monitor her blood pressure as well as her progress with the above lifestyle modifications. Kimberly Heath agrees to continue her medications and will watch for signs of hypotension as she continues her lifestyle modifications. She is to monitor her blood pressure at home and at work 2-3 times per week and MyChart with results. Kimberly Heath agrees to follow up with our clinic in 3 weeks.  Vitamin D Deficiency Kimberly Heath was informed that low vitamin D levels contributes to fatigue and are associated with obesity, breast, and colon cancer. Kimberly Heath agrees to continue taking prescription Vit D 50,000 IU every week, no refill needed. She will follow up for routine testing of vitamin D, at least 2-3 times per year. She was informed of the risk of over-replacement of vitamin D and agrees to not increase her dose unless she discusses this with us first. Kimberly Heath agrees to follow up with our clinic in 3 weeks.  Obesity Kimberly Heath is currently in the action stage of change. As such, her goal is to continue with weight loss efforts She has agreed to follow the Category 3 plan Kimberly Heath has been instructed to work up to a goal of 150 minutes of combined cardio and strengthening exercise per week for weight loss and overall health benefits. We discussed the following Behavioral Modification Strategies today: increasing lean protein intake, increasing vegetables and work on meal planning and easy cooking  plans, keeping healthy foods in the home, and planning for success   [name] has agreed to follow up with our clinic in 3 weeks with Dr. Owens Shark. She was informed of the importance of frequent follow up visits to maximize her success with intensive lifestyle modifications for her multiple health conditions.  ALLERGIES: Allergies  Allergen Reactions  . Mushroom Extract  Complex Other (See Comments)    Flu like symptoms     MEDICATIONS: Current Outpatient Medications on File Prior to Visit  Medication Sig Dispense Refill  . acetaminophen (TYLENOL) 500 MG tablet Take 2 tablets (1,000 mg total) by mouth every 6 (six) hours as needed. 30 tablet 0  . amLODipine (NORVASC) 10 MG tablet Take 10 mg by mouth daily.  0  . carvedilol (COREG) 25 MG tablet Take 25 mg by mouth 2 (two) times daily with a meal.    . Cholecalciferol (VITAMIN D3) 5000 units CAPS Take 5,000 Units by mouth.    . cyclobenzaprine (FLEXERIL) 10 MG tablet Take 10 mg by mouth daily as needed for muscle spasms.  1  . diclofenac (VOLTAREN) 75 MG EC tablet Take 75 mg by mouth daily as needed for pain.  0  . diphenhydrAMINE (BENADRYL) 25 MG tablet Take 1 tablet (25 mg total) by mouth every 6 (six) hours. 20 tablet 0  . Ferrous Sulfate 27 MG TABS Take by mouth.    . furosemide (LASIX) 20 MG tablet Take 20 mg by mouth daily.    . hydrochlorothiazide (HYDRODIURIL) 25 MG tablet Take 25 mg by mouth daily.    . Melatonin 10 MG TABS Take 10 mg by mouth daily.    . metoCLOPramide (REGLAN) 10 MG tablet Take 1 tablet (10 mg total) by mouth every 6 (six) hours as needed for nausea (nausea/headache). Take with 25 mg of Benadryl and 1000 mg of acetaminophen. 20 tablet 0  . minocycline (DYNACIN) 50 MG tablet Take 1 tablet (50 mg total) by mouth 2 (two) times daily. 60 tablet 0  . potassium chloride (K-DUR) 10 MEQ tablet Take 10 mEq by mouth daily.    . Vitamin D, Ergocalciferol, (DRISDOL) 1.25 MG (50000 UT) CAPS capsule Take 1 capsule (50,000 Units total) by mouth every 7 (seven) days. 4 capsule 0   No current facility-administered medications on file prior to visit.     PAST MEDICAL HISTORY: Past Medical History:  Diagnosis Date  . Anxiety   . Back pain   . Chest pain   . Depression   . Edema, lower extremity   . Fluid collection (edema) in the arms, legs, hands and feet   . Hypertension   . Pericardial  effusion   . Sleep apnea   . SOB (shortness of breath)     PAST SURGICAL HISTORY: No past surgical history on file.  SOCIAL HISTORY: Social History   Tobacco Use  . Smoking status: Never Smoker  . Smokeless tobacco: Never Used  Substance Use Topics  . Alcohol use: Yes    Comment: occ  . Drug use: No    FAMILY HISTORY: Family History  Problem Relation Age of Onset  . High blood pressure Father     ROS: Review of Systems  Constitutional: Positive for malaise/fatigue. Negative for weight loss.  Gastrointestinal: Negative for nausea and vomiting.  Musculoskeletal:       Negative muscle weakness  Neurological: Positive for headaches.    PHYSICAL EXAM: Pt in no acute distress  RECENT LABS AND TESTS: BMET  Component Value Date/Time   NA 137 05/08/2019 0000   K 3.8 05/08/2019 0000   CL 100 05/08/2019 0000   CO2 23 05/08/2019 0000   GLUCOSE 95 05/08/2019 0000   GLUCOSE 111 (H) 10/31/2017 0610   BUN 6 05/08/2019 0000   CREATININE 0.80 05/08/2019 0000   CALCIUM 8.7 05/08/2019 0000   GFRNONAA 101 05/08/2019 0000   GFRAA 117 05/08/2019 0000   Lab Results  Component Value Date   HGBA1C 5.3 05/08/2019   Lab Results  Component Value Date   INSULIN 32.8 (H) 05/08/2019   CBC    Component Value Date/Time   WBC 5.0 05/08/2019 0000   WBC 4.2 10/31/2017 0610   RBC 4.84 05/08/2019 0000   RBC 4.44 10/31/2017 0610   HGB 12.0 05/08/2019 0000   HCT 36.9 05/08/2019 0000   PLT 171 10/31/2017 0610   MCV 76 (L) 05/08/2019 0000   MCH 24.8 (L) 05/08/2019 0000   MCH 26.4 10/31/2017 0610   MCHC 32.5 05/08/2019 0000   MCHC 34.9 10/31/2017 0610   RDW 18.3 (H) 05/08/2019 0000   LYMPHSABS 1.7 05/08/2019 0000   MONOABS 0.5 10/31/2017 0610   EOSABS 0.0 05/08/2019 0000   BASOSABS 0.0 05/08/2019 0000   Iron/TIBC/Ferritin/ %Sat No results found for: IRON, TIBC, FERRITIN, IRONPCTSAT Lipid Panel     Component Value Date/Time   CHOL 193 05/08/2019 0000   TRIG 80 05/08/2019  0000   HDL 69 05/08/2019 0000   LDLCALC 108 (H) 05/08/2019 0000   Hepatic Function Panel     Component Value Date/Time   PROT 9.0 (H) 05/08/2019 0000   ALBUMIN 4.0 05/08/2019 0000   AST 44 (H) 05/08/2019 0000   ALT 51 (H) 05/08/2019 0000   ALKPHOS 59 05/08/2019 0000   BILITOT 0.6 05/08/2019 0000      Component Value Date/Time   TSH 1.460 05/08/2019 0000      I, Burt Knack, am acting as transcriptionist for Debbra Riding, MD  I have reviewed the above documentation for accuracy and completeness, and I agree with the above. - Debbra Riding, MD

## 2019-09-26 ENCOUNTER — Encounter (INDEPENDENT_AMBULATORY_CARE_PROVIDER_SITE_OTHER): Payer: Self-pay | Admitting: Family Medicine

## 2019-09-26 ENCOUNTER — Other Ambulatory Visit: Payer: Self-pay

## 2019-09-26 ENCOUNTER — Ambulatory Visit (INDEPENDENT_AMBULATORY_CARE_PROVIDER_SITE_OTHER): Payer: BC Managed Care – PPO | Admitting: Family Medicine

## 2019-09-26 VITALS — BP 127/82 | HR 90 | Temp 98.7°F | Ht 63.0 in | Wt 245.0 lb

## 2019-09-26 DIAGNOSIS — Z6841 Body Mass Index (BMI) 40.0 and over, adult: Secondary | ICD-10-CM

## 2019-09-26 DIAGNOSIS — Z9189 Other specified personal risk factors, not elsewhere classified: Secondary | ICD-10-CM

## 2019-09-26 DIAGNOSIS — R5383 Other fatigue: Secondary | ICD-10-CM

## 2019-09-26 DIAGNOSIS — E559 Vitamin D deficiency, unspecified: Secondary | ICD-10-CM | POA: Diagnosis not present

## 2019-09-26 MED ORDER — VITAMIN D (ERGOCALCIFEROL) 1.25 MG (50000 UNIT) PO CAPS: 50000.0000 [IU] | ORAL_CAPSULE | ORAL | 0 refills | Status: DC

## 2019-09-26 NOTE — Progress Notes (Signed)
Office: [phone]  /  Fax: [phone]   HPI:   Chief Complaint: OBESITY Kimberly Heath is here to discuss her progress with her obesity treatment plan. She is on the Category 3 plan and is following her eating plan approximately 40 % of the time. She states she is exercising 0 minutes 0 times per week. Kimberly Heath skips breakfast most days. She has been eating out quite often.  Her weight is 245 lb (111.1 kg) today and has had a weight gain of 4 pounds over a period of 10 weeks since her last visit. She has lost 0 lbs since starting treatment with us.  Vitamin D Deficiency Kimberly Heath has a diagnosis of vitamin D deficiency. She is currently on vit D, but is not at goal. Her last vitamin D level was 24.7 on 05/08/19. Kimberly Heath denies nausea, vomiting, or muscle weakness.  At risk for osteopenia and osteoporosis Kimberly Heath is at higher risk of osteopenia and osteoporosis due to vitamin D deficiency.   Fatigue Kimberly Heath feels her energy is lower than it should be. This has worsened with weight gain and has worsened recently. She is in school and working full time. Kimberly Heath admits to not getting enough sleep and she feels fatigued. Her last CBC, B12, and thyroid function were within normal limits.   ASSESSMENT AND PLAN:  Vitamin D deficiency - Plan: Vitamin D, Ergocalciferol, (DRISDOL) 1.25 MG (50000 UT) CAPS capsule  Other fatigue  At risk for osteoporosis  Class 3 severe obesity with serious comorbidity and body mass index (BMI) of 40.0 to 44.9 in adult, unspecified obesity type (HCC)  PLAN:  Vitamin D Deficiency Kimberly Heath was informed that low vitamin D levels contribute to fatigue and are associated with obesity, breast, and colon cancer. Kimberly Heath agrees to continue to take prescription Vit D @50 ,000 IU every week #4 with no refills and will follow up for routine testing of vitamin D, at least 2-3 times per year. She was informed of the risk of over-replacement of vitamin D and agrees to not increase  her dose unless she discusses this with us first. Kimberly Heath agrees to follow up in 3 weeks as directed.  At risk for osteopenia and osteoporosis Kimberly Heath was given extended (15 minutes) osteoporosis prevention counseling today. Kimberly Heath is at risk for osteopenia and osteoporosis due to her vitamin D deficiency. She was encouraged to take her vitamin D and follow her higher calcium diet and increase strengthening exercise to help strengthen her bones and decrease her risk of osteopenia and osteoporosis.  Fatigue Kimberly Heath was informed that her fatigue may be related to obesity, depression, or many other causes.  Kimberly Heath has agreed to work on diet, exercise, and weight loss to help with fatigue. Proper sleep hygiene was discussed including the need for 7-8 hours of quality sleep each night.  Obesity Kimberly Heath is currently in the action stage of change. As such, her goal is to continue with weight loss efforts. She has agreed to follow the Category 3 plan and add breakfast options. Kimberly Heath has been instructed to work up to a goal of 150 minutes of combined cardio and strengthening exercise per week for weight loss and overall health benefits. We discussed the following Behavioral Modification Strategies today: increasing lean protein intake, decreasing simple carbohydrates, planning for success, and work on meal planning and easy cooking plans. Kimberly Heath was given the egg muffins recipe handout.  Kimberly Heath has agreed to follow up with our clinic in 3 weeks. She was informed of the importance of frequent follow  up visits to maximize her success with intensive lifestyle modifications for her multiple health conditions.  ALLERGIES: Allergies  Allergen Reactions  . Mushroom Extract Complex Other (See Comments)    Flu like symptoms     MEDICATIONS: Current Outpatient Medications on File Prior to Visit  Medication Sig Dispense Refill  . acetaminophen (TYLENOL) 500 MG tablet Take 2 tablets (1,000 mg total) by  mouth every 6 (six) hours as needed. 30 tablet 0  . amLODipine (NORVASC) 10 MG tablet Take 10 mg by mouth daily.  0  . carvedilol (COREG) 25 MG tablet Take 25 mg by mouth 2 (two) times daily with a meal.    . Cholecalciferol (VITAMIN D3) 5000 units CAPS Take 5,000 Units by mouth.    . cyclobenzaprine (FLEXERIL) 10 MG tablet Take 10 mg by mouth daily as needed for muscle spasms.  1  . diclofenac (VOLTAREN) 75 MG EC tablet Take 75 mg by mouth daily as needed for pain.  0  . diphenhydrAMINE (BENADRYL) 25 MG tablet Take 1 tablet (25 mg total) by mouth every 6 (six) hours. 20 tablet 0  . Ferrous Sulfate 27 MG TABS Take by mouth.    . furosemide (LASIX) 20 MG tablet Take 20 mg by mouth daily.    . hydrochlorothiazide (HYDRODIURIL) 25 MG tablet Take 25 mg by mouth daily.    . Melatonin 10 MG TABS Take 10 mg by mouth daily.    . metoCLOPramide (REGLAN) 10 MG tablet Take 1 tablet (10 mg total) by mouth every 6 (six) hours as needed for nausea (nausea/headache). Take with 25 mg of Benadryl and 1000 mg of acetaminophen. 20 tablet 0  . minocycline (DYNACIN) 50 MG tablet Take 1 tablet (50 mg total) by mouth 2 (two) times daily. 60 tablet 0  . potassium chloride (K-DUR) 10 MEQ tablet Take 10 mEq by mouth daily.    . Vitamin D, Ergocalciferol, (DRISDOL) 1.25 MG (50000 UT) CAPS capsule Take 1 capsule (50,000 Units total) by mouth every 7 (seven) days. 4 capsule 0   No current facility-administered medications on file prior to visit.     PAST MEDICAL HISTORY: Past Medical History:  Diagnosis Date  . Anxiety   . Back pain   . Chest pain   . Depression   . Edema, lower extremity   . Fluid collection (edema) in the arms, legs, hands and feet   . Hypertension   . Pericardial effusion   . Sleep apnea   . SOB (shortness of breath)     PAST SURGICAL HISTORY: History reviewed. No pertinent surgical history.  SOCIAL HISTORY: Social History   Tobacco Use  . Smoking status: Never Smoker  . Smokeless  tobacco: Never Used  Substance Use Topics  . Alcohol use: Yes    Comment: occ  . Drug use: No    FAMILY HISTORY: Family History  Problem Relation Age of Onset  . High blood pressure Father     ROS: Review of Systems  Constitutional: Positive for malaise/fatigue. Negative for weight loss.  Gastrointestinal: Negative for nausea and vomiting.  Musculoskeletal:       Negative for muscle weakness.    PHYSICAL EXAM: Blood pressure 127/82, pulse 90, temperature 98.7 F (37.1 C), temperature source Oral, height 5\' 3"  (1.6 m), weight 245 lb (111.1 kg), last menstrual period 09/10/2019, SpO2 100 %. Body mass index is 43.4 kg/m. Physical Exam Vitals signs reviewed.  Constitutional:      Appearance: Normal appearance. She is obese.  Cardiovascular:     Rate and Rhythm: Normal rate.  Pulmonary:     Effort: Pulmonary effort is normal.  Musculoskeletal: Normal range of motion.  Skin:    General: Skin is warm and dry.  Neurological:     Mental Status: She is alert and oriented to person, place, and time.  Psychiatric:        Mood and Affect: Mood normal.        Behavior: Behavior normal.     RECENT LABS AND TESTS: BMET    Component Value Date/Time   NA 137 05/08/2019 0000   K 3.8 05/08/2019 0000   CL 100 05/08/2019 0000   CO2 23 05/08/2019 0000   GLUCOSE 95 05/08/2019 0000   GLUCOSE 111 (H) 10/31/2017 0610   BUN 6 05/08/2019 0000   CREATININE 0.80 05/08/2019 0000   CALCIUM 8.7 05/08/2019 0000   GFRNONAA 101 05/08/2019 0000   GFRAA 117 05/08/2019 0000   Lab Results  Component Value Date   HGBA1C 5.3 05/08/2019   Lab Results  Component Value Date   INSULIN 32.8 (H) 05/08/2019   CBC    Component Value Date/Time   WBC 5.0 05/08/2019 0000   WBC 4.2 10/31/2017 0610   RBC 4.84 05/08/2019 0000   RBC 4.44 10/31/2017 0610   HGB 12.0 05/08/2019 0000   HCT 36.9 05/08/2019 0000   PLT 171 10/31/2017 0610   MCV 76 (L) 05/08/2019 0000   MCH 24.8 (L) 05/08/2019 0000    MCH 26.4 10/31/2017 0610   MCHC 32.5 05/08/2019 0000   MCHC 34.9 10/31/2017 0610   RDW 18.3 (H) 05/08/2019 0000   LYMPHSABS 1.7 05/08/2019 0000   MONOABS 0.5 10/31/2017 0610   EOSABS 0.0 05/08/2019 0000   BASOSABS 0.0 05/08/2019 0000   Iron/TIBC/Ferritin/ %Sat No results found for: IRON, TIBC, FERRITIN, IRONPCTSAT Lipid Panel     Component Value Date/Time   CHOL 193 05/08/2019 0000   TRIG 80 05/08/2019 0000   HDL 69 05/08/2019 0000   LDLCALC 108 (H) 05/08/2019 0000   Hepatic Function Panel     Component Value Date/Time   PROT 9.0 (H) 05/08/2019 0000   ALBUMIN 4.0 05/08/2019 0000   AST 44 (H) 05/08/2019 0000   ALT 51 (H) 05/08/2019 0000   ALKPHOS 59 05/08/2019 0000   BILITOT 0.6 05/08/2019 0000      Component Value Date/Time   TSH 1.460 05/08/2019 0000   Results for SHERLINE, EBERWEIN (MRN [id number]) as of 09/26/2019 15:04  Ref. Range 05/08/2019 00:00  Vitamin D, 25-Hydroxy Latest Ref Range: 30.0 - 100.0 ng/mL 24.7 (L)   OBESITY BEHAVIORAL INTERVENTION VISIT  Today's visit was # 9  Starting weight: 243 lbs Starting date: 05/08/19 Today's weight : Weight: 245 lb (111.1 kg)  Today's date: 09/26/2019 Total lbs lost to date: 0    09/26/2019  Height  (1.6 m)  Weight 245 lb (111.1 kg)  BMI (Calculated) 43.41  BLOOD PRESSURE - SYSTOLIC 127  BLOOD PRESSURE - DIASTOLIC 82   Body Fat % 46 %  Total Body Water (lbs) 94 lbs   ASK: We discussed the diagnosis of obesity with Evorn Gong today and Vaudie agreed to give [place] permission to discuss obesity behavioral modification therapy today.  ASSESS: Yatzari has the diagnosis of obesity and her BMI today is 43.41. Lataja is in the action stage of change.   ADVISE: Meagen was educated on the multiple health risks of obesity as well as the benefit of weight  loss to improve her health. She was advised of the need for long term treatment and the importance of lifestyle modifications to improve her current health and  to decrease her risk of future health problems.  AGREE: Multiple dietary modification options and treatment options were discussed and Eldean agreed to follow the recommendations documented in the above note.  ARRANGE: Sui was educated on the importance of frequent visits to treat obesity as outlined per CMS and USPSTF guidelines and agreed to schedule her next follow up appointment today.  Lenward Chancellor, CMA, am acting as Location manager for [employer], FNP-C.  I have reviewed the above documentation for accuracy and completeness, and I agree with the above.  - Mong Neal, FNP-C.

## 2019-09-27 ENCOUNTER — Encounter (INDEPENDENT_AMBULATORY_CARE_PROVIDER_SITE_OTHER): Payer: Self-pay | Admitting: Family Medicine

## 2019-09-27 DIAGNOSIS — Z113 Encounter for screening for infections with a predominantly sexual mode of transmission: Secondary | ICD-10-CM | POA: Diagnosis not present

## 2019-09-27 DIAGNOSIS — Z6841 Body Mass Index (BMI) 40.0 and over, adult: Secondary | ICD-10-CM | POA: Insufficient documentation

## 2019-09-27 DIAGNOSIS — I10 Essential (primary) hypertension: Secondary | ICD-10-CM | POA: Diagnosis not present

## 2019-09-27 DIAGNOSIS — Z7251 High risk heterosexual behavior: Secondary | ICD-10-CM | POA: Diagnosis not present

## 2019-09-27 DIAGNOSIS — E559 Vitamin D deficiency, unspecified: Secondary | ICD-10-CM | POA: Insufficient documentation

## 2019-10-01 ENCOUNTER — Ambulatory Visit (INDEPENDENT_AMBULATORY_CARE_PROVIDER_SITE_OTHER): Payer: BC Managed Care – PPO | Admitting: Bariatrics

## 2019-10-17 ENCOUNTER — Ambulatory Visit (INDEPENDENT_AMBULATORY_CARE_PROVIDER_SITE_OTHER): Payer: BC Managed Care – PPO | Admitting: Family Medicine

## 2020-03-03 DIAGNOSIS — Z113 Encounter for screening for infections with a predominantly sexual mode of transmission: Secondary | ICD-10-CM | POA: Diagnosis not present

## 2020-03-03 DIAGNOSIS — I10 Essential (primary) hypertension: Secondary | ICD-10-CM | POA: Diagnosis not present

## 2020-04-17 DIAGNOSIS — I10 Essential (primary) hypertension: Secondary | ICD-10-CM | POA: Diagnosis not present

## 2020-04-17 DIAGNOSIS — Z1159 Encounter for screening for other viral diseases: Secondary | ICD-10-CM | POA: Diagnosis not present

## 2020-04-17 DIAGNOSIS — N926 Irregular menstruation, unspecified: Secondary | ICD-10-CM | POA: Diagnosis not present

## 2020-05-15 DIAGNOSIS — N926 Irregular menstruation, unspecified: Secondary | ICD-10-CM | POA: Diagnosis not present

## 2020-05-29 DIAGNOSIS — R350 Frequency of micturition: Secondary | ICD-10-CM | POA: Diagnosis not present

## 2020-05-29 DIAGNOSIS — R7989 Other specified abnormal findings of blood chemistry: Secondary | ICD-10-CM | POA: Diagnosis not present

## 2020-05-29 DIAGNOSIS — R946 Abnormal results of thyroid function studies: Secondary | ICD-10-CM | POA: Diagnosis not present

## 2020-05-29 DIAGNOSIS — I10 Essential (primary) hypertension: Secondary | ICD-10-CM | POA: Diagnosis not present

## 2020-05-29 DIAGNOSIS — E876 Hypokalemia: Secondary | ICD-10-CM | POA: Diagnosis not present

## 2020-06-24 DIAGNOSIS — Z03818 Encounter for observation for suspected exposure to other biological agents ruled out: Secondary | ICD-10-CM | POA: Diagnosis not present

## 2020-07-14 DIAGNOSIS — N760 Acute vaginitis: Secondary | ICD-10-CM | POA: Diagnosis not present

## 2020-07-14 DIAGNOSIS — N926 Irregular menstruation, unspecified: Secondary | ICD-10-CM | POA: Diagnosis not present

## 2020-07-14 DIAGNOSIS — Z113 Encounter for screening for infections with a predominantly sexual mode of transmission: Secondary | ICD-10-CM | POA: Diagnosis not present

## 2020-07-14 DIAGNOSIS — N898 Other specified noninflammatory disorders of vagina: Secondary | ICD-10-CM | POA: Diagnosis not present

## 2020-07-17 DIAGNOSIS — Z03818 Encounter for observation for suspected exposure to other biological agents ruled out: Secondary | ICD-10-CM | POA: Diagnosis not present

## 2020-07-19 DIAGNOSIS — Z20822 Contact with and (suspected) exposure to covid-19: Secondary | ICD-10-CM | POA: Diagnosis not present

## 2020-07-23 DIAGNOSIS — Z113 Encounter for screening for infections with a predominantly sexual mode of transmission: Secondary | ICD-10-CM | POA: Diagnosis not present

## 2020-07-23 DIAGNOSIS — N3000 Acute cystitis without hematuria: Secondary | ICD-10-CM | POA: Diagnosis not present

## 2020-07-23 DIAGNOSIS — R102 Pelvic and perineal pain: Secondary | ICD-10-CM | POA: Diagnosis not present

## 2020-07-23 DIAGNOSIS — N898 Other specified noninflammatory disorders of vagina: Secondary | ICD-10-CM | POA: Diagnosis not present

## 2020-08-31 DIAGNOSIS — Z79899 Other long term (current) drug therapy: Secondary | ICD-10-CM | POA: Diagnosis not present

## 2020-08-31 DIAGNOSIS — N926 Irregular menstruation, unspecified: Secondary | ICD-10-CM | POA: Diagnosis not present

## 2020-08-31 DIAGNOSIS — E282 Polycystic ovarian syndrome: Secondary | ICD-10-CM | POA: Diagnosis not present

## 2020-08-31 DIAGNOSIS — A499 Bacterial infection, unspecified: Secondary | ICD-10-CM | POA: Diagnosis not present

## 2020-08-31 DIAGNOSIS — N920 Excessive and frequent menstruation with regular cycle: Secondary | ICD-10-CM | POA: Diagnosis not present

## 2020-08-31 DIAGNOSIS — E291 Testicular hypofunction: Secondary | ICD-10-CM | POA: Diagnosis not present

## 2020-09-15 DIAGNOSIS — R350 Frequency of micturition: Secondary | ICD-10-CM | POA: Diagnosis not present

## 2020-09-15 DIAGNOSIS — N926 Irregular menstruation, unspecified: Secondary | ICD-10-CM | POA: Diagnosis not present

## 2020-09-15 DIAGNOSIS — N898 Other specified noninflammatory disorders of vagina: Secondary | ICD-10-CM | POA: Diagnosis not present

## 2020-12-18 ENCOUNTER — Encounter (INDEPENDENT_AMBULATORY_CARE_PROVIDER_SITE_OTHER): Payer: Self-pay

## 2020-12-31 ENCOUNTER — Ambulatory Visit: Payer: Self-pay | Admitting: Psychology

## 2021-01-26 ENCOUNTER — Ambulatory Visit: Payer: 59 | Admitting: Psychology

## 2021-04-08 LAB — HM PAP SMEAR

## 2021-05-01 ENCOUNTER — Emergency Department (HOSPITAL_BASED_OUTPATIENT_CLINIC_OR_DEPARTMENT_OTHER): Payer: 59

## 2021-05-01 ENCOUNTER — Other Ambulatory Visit: Payer: Self-pay

## 2021-05-01 ENCOUNTER — Emergency Department (HOSPITAL_BASED_OUTPATIENT_CLINIC_OR_DEPARTMENT_OTHER)
Admission: EM | Admit: 2021-05-01 | Discharge: 2021-05-01 | Disposition: A | Discharge status: Home / Self Care | Payer: 59 | Attending: Emergency Medicine | Admitting: Emergency Medicine

## 2021-05-01 ENCOUNTER — Encounter (HOSPITAL_BASED_OUTPATIENT_CLINIC_OR_DEPARTMENT_OTHER): Payer: Self-pay

## 2021-05-01 DIAGNOSIS — I10 Essential (primary) hypertension: Secondary | ICD-10-CM | POA: Diagnosis not present

## 2021-05-01 DIAGNOSIS — R0602 Shortness of breath: Secondary | ICD-10-CM | POA: Diagnosis present

## 2021-05-01 DIAGNOSIS — Z79899 Other long term (current) drug therapy: Secondary | ICD-10-CM | POA: Diagnosis not present

## 2021-05-01 DIAGNOSIS — R519 Headache, unspecified: Secondary | ICD-10-CM | POA: Diagnosis not present

## 2021-05-01 DIAGNOSIS — R079 Chest pain, unspecified: Secondary | ICD-10-CM | POA: Diagnosis not present

## 2021-05-01 DIAGNOSIS — Z20822 Contact with and (suspected) exposure to covid-19: Secondary | ICD-10-CM | POA: Diagnosis not present

## 2021-05-01 LAB — COMPREHENSIVE METABOLIC PANEL
ALT: 27 U/L (ref 0–44)
AST: 23 U/L (ref 15–41)
Albumin: 4.3 g/dL (ref 3.5–5.0)
Alkaline Phosphatase: 59 U/L (ref 38–126)
Anion gap: 6 (ref 5–15)
BUN: 9 mg/dL (ref 6–20)
CO2: 24 mmol/L (ref 22–32)
Calcium: 9.1 mg/dL (ref 8.9–10.3)
Chloride: 106 mmol/L (ref 98–111)
Creatinine, Ser: 0.9 mg/dL (ref 0.44–1.00)
GFR, Estimated: >60 mL/min (ref >60)
Glucose, Bld: 126 mg/dL — ABNORMAL HIGH (ref 70–99)
Potassium: 4.1 mmol/L (ref 3.5–5.1)
Sodium: 136 mmol/L (ref 135–145)
Total Bilirubin: 0.5 mg/dL (ref 0.3–1.2)
Total Protein: 8.4 g/dL — ABNORMAL HIGH (ref 6.5–8.1)

## 2021-05-01 LAB — D-DIMER, QUANTITATIVE: D-Dimer, Quant: 0.78 ug/mL-FEU — ABNORMAL HIGH (ref 0.00–0.50)

## 2021-05-01 LAB — CBC WITH DIFFERENTIAL/PLATELET
Abs Immature Granulocytes: 0.01 10*3/uL (ref 0.00–0.07)
Basophils Absolute: 0 10*3/uL (ref 0.0–0.1)
Basophils Relative: 0 %
Eosinophils Absolute: 0.1 10*3/uL (ref 0.0–0.5)
Eosinophils Relative: 2 %
HCT: 39.7 % (ref 36.0–46.0)
Hemoglobin: 13.6 g/dL (ref 12.0–15.0)
Immature Granulocytes: 0 %
Lymphocytes Relative: 43 %
Lymphs Abs: 1.9 10*3/uL (ref 0.7–4.0)
MCH: 27.1 pg (ref 26.0–34.0)
MCHC: 34.3 g/dL (ref 30.0–36.0)
MCV: 79.1 fL — ABNORMAL LOW (ref 80.0–100.0)
Monocytes Absolute: 0.4 10*3/uL (ref 0.1–1.0)
Monocytes Relative: 9 %
Neutro Abs: 2.1 10*3/uL (ref 1.7–7.7)
Neutrophils Relative %: 46 %
Platelets: 196 10*3/uL (ref 150–400)
RBC: 5.02 MIL/uL (ref 3.87–5.11)
RDW: 15.8 % — ABNORMAL HIGH (ref 11.5–15.5)
WBC: 4.5 10*3/uL (ref 4.0–10.5)
nRBC: 0 % (ref 0.0–0.2)

## 2021-05-01 LAB — TROPONIN I (HIGH SENSITIVITY)
Troponin I (High Sensitivity): 2 ng/L (ref <18)
Troponin I (High Sensitivity): 2 ng/L (ref <18)

## 2021-05-01 LAB — RESP PANEL BY RT-PCR (FLU A&B, COVID) ARPGX2
Influenza A by PCR: NEGATIVE
Influenza B by PCR: NEGATIVE
SARS Coronavirus 2 by RT PCR: NEGATIVE

## 2021-05-01 LAB — PREGNANCY, URINE: Preg Test, Ur: NEGATIVE

## 2021-05-01 LAB — BRAIN NATRIURETIC PEPTIDE: B Natriuretic Peptide: 11.3 pg/mL (ref 0.0–100.0)

## 2021-05-01 MED ORDER — IOHEXOL 350 MG/ML SOLN
100.0000 mL | Freq: Once | INTRAVENOUS | Status: AC | PRN
  Administered 2021-05-01: 75 mL via INTRAVENOUS

## 2021-05-01 MED ORDER — ONDANSETRON HCL 4 MG/2ML IJ SOLN
4.0000 mg | Freq: Once | INTRAMUSCULAR | Status: AC
  Administered 2021-05-01: 4 mg via INTRAVENOUS
  Filled 2021-05-01: qty 2

## 2021-05-01 NOTE — ED Provider Notes (Signed)
[facility] EMERGENCY DEPARTMENT Provider Note   CSN: [id number] Arrival date & time: 05/01/21  1225     History Chief Complaint  Patient presents with  . Shortness of Breath    Kimberly Heath is a 30 y.o. female.  HPI 30 year old female with history of anxiety, chest pain, depression, hypertension presents to the ER with complaints of chest pain, shortness of breath has been ongoing over the last 3 days, worsening today.  Patient reports also ongoing headache, with history of migraines.  She reports she took Excedrin this morning as well as Reglan, and after she took the Reglan which is a new medication for her, she started to feel short of breath afterwards.  She also endorses chest pain and pain on inspiration.  Per chart review, has a history of shortness of breath and obesity.  Denies any leg swelling.  No cough, fevers, chills, nasal congestion.    Past Medical History:  Diagnosis Date  . Anxiety   . Back pain   . Chest pain   . Depression   . Edema, lower extremity   . Fluid collection (edema) in the arms, legs, hands and feet   . Hypertension   . Pericardial effusion   . Sleep apnea   . SOB (shortness of breath)     Patient Active Problem List   Diagnosis Date Noted  . Vitamin D deficiency 09/27/2019  . Class 3 severe obesity with serious comorbidity and body mass index (BMI) of 40.0 to 44.9 in adult [facility]) 09/27/2019    History reviewed. No pertinent surgical history.   OB History   No obstetric history on file.     Family History  Problem Relation Age of Onset  . High blood pressure Father     Social History   Tobacco Use  . Smoking status: Never Smoker  . Smokeless tobacco: Never Used  Vaping Use  . Vaping Use: Never used  Substance Use Topics  . Alcohol use: Yes    Comment: occ  . Drug use: No    Home Medications Prior to Admission medications   Medication Sig Start Date End Date Taking? Authorizing Provider  acetaminophen  (TYLENOL) 500 MG tablet Take 2 tablets (1,000 mg total) by mouth every 6 (six) hours as needed. 05/16/18  Yes Arby Barrette, MD  amLODipine (NORVASC) 10 MG tablet Take 10 mg by mouth daily. 09/02/16  Yes [provider]  carvedilol (COREG) 25 MG tablet Take 25 mg by mouth 2 (two) times daily with a meal.   Yes [provider]  Cholecalciferol (VITAMIN D3) 5000 units CAPS Take 5,000 Units by mouth.   Yes [provider]  diphenhydrAMINE (BENADRYL) 25 MG tablet Take 1 tablet (25 mg total) by mouth every 6 (six) hours. 05/16/18  Yes Arby Barrette, MD  Ferrous Sulfate 27 MG TABS Take by mouth.   Yes [provider]  Melatonin 10 MG TABS Take 10 mg by mouth daily.   Yes [provider]  metoCLOPramide (REGLAN) 10 MG tablet Take 1 tablet (10 mg total) by mouth every 6 (six) hours as needed for nausea (nausea/headache). Take with 25 mg of Benadryl and 1000 mg of acetaminophen. 05/16/18  Yes Arby Barrette, MD  minocycline (DYNACIN) 50 MG tablet Take 1 tablet (50 mg total) by mouth 2 (two) times daily. 07/16/19  Yes Langston Reusing, MD  Vitamin D, Ergocalciferol, (DRISDOL) 1.25 MG (50000 UT) CAPS capsule Take 1 capsule (50,000 Units total) by mouth every  7 (seven) days. 09/26/19  Yes Whitmire, Dawn W, FNP  cyclobenzaprine (FLEXERIL) 10 MG tablet Take 10 mg by mouth daily as needed for muscle spasms. 09/02/16   [provider]  diclofenac (VOLTAREN) 75 MG EC tablet Take 75 mg by mouth daily as needed for pain. 08/24/16   [provider]  furosemide (LASIX) 20 MG tablet Take 20 mg by mouth daily.    [provider]  hydrochlorothiazide (HYDRODIURIL) 25 MG tablet Take 25 mg by mouth daily.    [provider]  potassium chloride (K-DUR) 10 MEQ tablet Take 10 mEq by mouth daily.    [provider]    Allergies    Mushroom extract complex  Review of Systems   Review of Systems  Constitutional: Negative for chills and  fever.  HENT: Negative for congestion, ear pain and sore throat.   Eyes: Negative for pain and visual disturbance.  Respiratory: Positive for shortness of breath. Negative for cough.   Cardiovascular: Positive for chest pain. Negative for palpitations.  Gastrointestinal: Negative for abdominal pain and vomiting.  Genitourinary: Negative for dysuria and hematuria.  Musculoskeletal: Negative for arthralgias and back pain.  Skin: Negative for color change and rash.  Neurological: Positive for headaches. Negative for seizures, syncope and weakness.  All other systems reviewed and are negative.   Physical Exam Updated Vital Signs BP (!) 146/93   Pulse 83   Temp 98.4 F (36.9 C) (Oral)   Resp (!) 24   Ht 5\' 3"  (1.6 m)   Wt 125.2 kg   LMP 04/17/2021   SpO2 100%   BMI 48.89 kg/m   Physical Exam Vitals and nursing note reviewed.  Constitutional:      General: She is not in acute distress.    Appearance: She is well-developed. She is not ill-appearing, toxic-appearing or diaphoretic.  HENT:     Head: Normocephalic and atraumatic.  Eyes:     Conjunctiva/sclera: Conjunctivae normal.  Cardiovascular:     Rate and Rhythm: Normal rate and regular rhythm.     Heart sounds: No murmur heard.   Pulmonary:     Effort: Pulmonary effort is normal. No respiratory distress.     Breath sounds: Normal breath sounds. No decreased breath sounds, wheezing, rhonchi or rales.  Chest:     Chest wall: No tenderness.  Abdominal:     Palpations: Abdomen is soft.     Tenderness: There is no abdominal tenderness.  Musculoskeletal:     Cervical back: Neck supple.     Right lower leg: No tenderness. No edema.     Left lower leg: No tenderness. No edema.  Skin:    General: Skin is warm and dry.  Neurological:     General: No focal deficit present.     Mental Status: She is alert.  Psychiatric:        Mood and Affect: Mood normal.        Behavior: Behavior normal.     ED Results / Procedures /  Treatments   Labs (all labs ordered are listed, but only abnormal results are displayed) Labs Reviewed  CBC WITH DIFFERENTIAL/PLATELET - Abnormal; Notable for the following components:      Result Value   MCV 79.1 (*)    RDW 15.8 (*)    All other components within normal limits  COMPREHENSIVE METABOLIC PANEL - Abnormal; Notable for the following components:   Glucose, Bld 126 (*)    Total Protein 8.4 (*)    All  other components within normal limits  D-DIMER, QUANTITATIVE (NOT AT [facility]) - Abnormal; Notable for the following components:   D-Dimer, Quant 0.78 (*)    All other components within normal limits  RESP PANEL BY RT-PCR (FLU A&B, COVID) ARPGX2  PREGNANCY, URINE  BRAIN NATRIURETIC PEPTIDE  TROPONIN I (HIGH SENSITIVITY)  TROPONIN I (HIGH SENSITIVITY)    EKG None  Radiology CT Angio Chest PE W and/or Wo Contrast  Result Date: 05/01/2021 CLINICAL DATA:  Shortness of breath and chest pain. EXAM: CT ANGIOGRAPHY CHEST WITH CONTRAST TECHNIQUE: Multidetector CT imaging of the chest was performed using the standard protocol during bolus administration of intravenous contrast. Multiplanar CT image reconstructions and MIPs were obtained to evaluate the vascular anatomy. CONTRAST:  75mL OMNIPAQUE IOHEXOL 350 MG/ML SOLN COMPARISON:  Chest radiograph dated 05/01/2021 and CT chest dated 06/27/2017. FINDINGS: Cardiovascular: Satisfactory opacification of the pulmonary arteries to the segmental level. No evidence of pulmonary embolism. The heart is mildly enlarged. No pericardial effusion. Mediastinum/Nodes: No enlarged mediastinal, hilar, or axillary lymph nodes. Thyroid gland, trachea, and esophagus demonstrate no significant findings. Lungs/Pleura: Lungs are clear. No pleural effusion or pneumothorax. Upper Abdomen: A 6 mm hypervascular lesion in the right hepatic lobe is not significantly changed since prior exam and likely represents a flash filling hemangioma. Musculoskeletal: No chest wall  abnormality. No acute or significant osseous findings. Review of the MIP images confirms the above findings. IMPRESSION: No evidence of pulmonary embolism.  No acute pulmonary process. Electronically Signed   By: Romona Curlsyler  Litton M.D.   On: 05/01/2021 15:48   DG Chest Portable 1 View  Result Date: 05/01/2021 CLINICAL DATA:  Chest pain and headache for 3 days associated with shortness of breath beginning today. Just began taking medication, RIGHT gland EXAM: PORTABLE CHEST 1 VIEW COMPARISON:  Portable exam 1234 hours compared to 07/12/2017 FINDINGS: Upper normal size of cardiac silhouette. Mediastinal contours and pulmonary vascularity normal. Lungs clear. No infiltrate, pleural effusion, or pneumothorax. Osseous structures unremarkable. IMPRESSION: No acute abnormalities. Electronically Signed   By: Ulyses SouthwardMark  Boles M.D.   On: 05/01/2021 12:59    Procedures Procedures   Medications Ordered in ED Medications  iohexol (OMNIPAQUE) 350 MG/ML injection 100 mL (75 mLs Intravenous Contrast Given 05/01/21 1504)  ondansetron (ZOFRAN) injection 4 mg (4 mg Intravenous Given 05/01/21 1615)    ED Course  I have reviewed the triage vital signs and the nursing notes.  Pertinent labs & imaging results that were available during my care of the patient were reviewed by me and considered in my medical decision making (see chart for details).    MDM Rules/Calculators/A&P                          30-year-old female with complaints of chest pain and shortness of breath.  On arrival, she is nontoxic-appearing, no acute distress, resting comfortably in ER bed.  Ambulated from the lobby to the room without evidence of hypoxia.  Slightly hypertensive with a blood pressure 162/93 on arrival, not tachycardic, tachypneic or hypoxic on my exam.  Lung sounds are clear.  No significant lower extremity edema.  DDx includes PE, pneumonia, new onset heart failure, COVID-19, anxiety, ACS  Labs and imaging ordered, reviewed and  interpreted by me.  CBC unremarkable, stable hemoglobin and no leukocytosis.  CMP without any significant electrode abnormalities.  BNP normal.  Delta troponin negative.  Pregnancy is negative.  COVID and flu are negative.  D-dimer is slightly elevated  at 0.78, CT angio without any evidence of pneumonia or PE.  Chest x-ray unremarkable.  EKG without ischemic changes.   Unclear cause of patient's symptoms, however no signs of ACS, PE, heart failure, low suspicion for dissection.  Patient without evidence of hypoxia with ambulation.  Patient was informed of the reassuring findings.  I do not think that the Reglan had any contribution to her shortness of breath.  Question possible anxiety.  Encourage PCP follow-up.  Return precautions discussed.  She voiced understanding and is agreeable.  Stable for discharge.   Final Clinical Impression(s) / ED Diagnoses Final diagnoses:  SOB (shortness of breath)  Chest pain, unspecified type    Rx / DC Orders ED Discharge Orders    None       Leone Brand 05/01/21 1623    Cheryll Cockayne, MD 05/07/21 2316

## 2021-05-01 NOTE — ED Triage Notes (Signed)
CP and HA x 3 days with associated SOB that started today.  Pt states she was taking a new medication (Reglan) and the SOB started shortly afterwards.

## 2021-05-01 NOTE — ED Notes (Signed)
Late note Patient ambulated from lobby to room 3, SpO2 96-100% with pleth.  Mild DOE noted once in room.  HR 105-110

## 2021-05-01 NOTE — Discharge Instructions (Signed)
You were evaluated in the Emergency Department and after careful evaluation, we did not find any emergent condition requiring admission or further testing in the hospital. ° °Please return to the Emergency Department if you experience any worsening of your condition.  We encourage you to follow up with a primary care provider.  Thank you for allowing us to be a part of your care. °

## 2021-05-06 DIAGNOSIS — G5603 Carpal tunnel syndrome, bilateral upper limbs: Secondary | ICD-10-CM | POA: Insufficient documentation

## 2021-08-09 ENCOUNTER — Encounter: Payer: Self-pay | Admitting: Obstetrics and Gynecology

## 2021-09-01 ENCOUNTER — Encounter: Payer: Self-pay | Admitting: Internal Medicine

## 2021-09-01 ENCOUNTER — Other Ambulatory Visit: Payer: Self-pay

## 2021-09-01 ENCOUNTER — Ambulatory Visit (INDEPENDENT_AMBULATORY_CARE_PROVIDER_SITE_OTHER): Payer: 59 | Admitting: Internal Medicine

## 2021-09-01 VITALS — BP 132/80 | HR 105 | Temp 98.1°F | Ht 63.0 in | Wt 271.0 lb

## 2021-09-01 DIAGNOSIS — R000 Tachycardia, unspecified: Secondary | ICD-10-CM | POA: Diagnosis not present

## 2021-09-01 DIAGNOSIS — Z6841 Body Mass Index (BMI) 40.0 and over, adult: Secondary | ICD-10-CM

## 2021-09-01 DIAGNOSIS — R635 Abnormal weight gain: Secondary | ICD-10-CM

## 2021-09-01 DIAGNOSIS — I272 Pulmonary hypertension, unspecified: Secondary | ICD-10-CM

## 2021-09-01 DIAGNOSIS — R0683 Snoring: Secondary | ICD-10-CM | POA: Diagnosis not present

## 2021-09-01 DIAGNOSIS — I10 Essential (primary) hypertension: Secondary | ICD-10-CM

## 2021-09-01 DIAGNOSIS — Z7689 Persons encountering health services in other specified circumstances: Secondary | ICD-10-CM

## 2021-09-01 DIAGNOSIS — D573 Sickle-cell trait: Secondary | ICD-10-CM

## 2021-09-01 DIAGNOSIS — F330 Major depressive disorder, recurrent, mild: Secondary | ICD-10-CM

## 2021-09-01 LAB — POCT URINALYSIS DIPSTICK
Bilirubin, UA: NEGATIVE
Blood, UA: NEGATIVE
Glucose, UA: NEGATIVE
Ketones, UA: NEGATIVE
Nitrite, UA: NEGATIVE
Protein, UA: NEGATIVE
Spec Grav, UA: 1.025 (ref 1.010–1.025)
Urobilinogen, UA: 0.2 E.U./dL
pH, UA: 7 (ref 5.0–8.0)

## 2021-09-01 LAB — POCT UA - MICROALBUMIN
Albumin/Creatinine Ratio, Urine, POC: <30
Creatinine, POC: 200 mg/dL
Microalbumin Ur, POC: 10 mg/L

## 2021-09-01 NOTE — Patient Instructions (Signed)

## 2021-09-01 NOTE — Progress Notes (Signed)
I,Katawbba Wiggins,acting as a Education administrator for [name], MD.,have documented all relevant documentation on the behalf of [name], MD,as directed by  [name], MD while in the presence of [name], MD.  This visit occurred during the SARS-CoV-2 public health emergency.  Safety protocols were in place, including screening questions prior to the visit, additional usage of staff PPE, and extensive cleaning of exam room while observing appropriate contact time as indicated for disinfecting solutions.  Subjective:     [name] , female    DOB: December 22, 1990 , 30 y.o.   MRN: [id number]   Chief Complaint  Patient presents with   Establish Care   Hypertension     HPI  The patient is here to establish care and for blood pressure evaluation. She was formerly seen at [place]. She has been evaluated by Cardiology in the past for her HTN. She states she was diagnosed w/ HTN at age 33.  Denies previous diagnosis of renal artery stenosis/fibromuscular dysplasia.   She is originally from [place]. She currently works at [employer] She is in school at [employer] for billing and coding.   Hypertension This is a chronic problem. The current episode started more than 1 year ago. The problem has been gradually improving since onset. The problem is controlled. Pertinent negatives include no blurred vision, chest pain, headaches or neck pain. Risk factors for coronary artery disease include obesity. The current treatment provides moderate improvement.    Past Medical History:  Diagnosis Date   Anxiety    Back pain    Chest pain    Depression    Edema, lower extremity    Fluid collection (edema) in the arms, legs, hands and feet    Hypertension    Pericardial effusion    Sleep apnea    SOB (shortness of breath)      Family History  Problem Relation Age of Onset   Healthy Mother    High blood pressure Father    Sickle cell trait Father      Current  Outpatient Medications:    amLODipine (NORVASC) 10 MG tablet, Take 10 mg by mouth daily., Disp: , Rfl: 0   carvedilol (COREG) 25 MG tablet, Take 25 mg by mouth 2 (two) times daily with a meal., Disp: , Rfl:    Ferrous Sulfate 27 MG TABS, Take by mouth., Disp: , Rfl:    minocycline (DYNACIN) 50 MG tablet, Take 1 tablet (50 mg total) by mouth 2 (two) times daily., Disp: 60 tablet, Rfl: 0   norethindrone (AYGESTIN) 5 MG tablet, Take 2 tablets by mouth daily., Disp: , Rfl:    spironolactone (ALDACTONE) 25 MG tablet, Take 25 mg by mouth daily., Disp: , Rfl:    Allergies  Allergen Reactions   Mushroom Extract Complex Other (See Comments)    Flu like symptoms       The patient states she uses oral progesterone-only contraceptive for birth control. Last LMP was No LMP recorded.. Negative for Dysmenorrhea. Negative for: breast discharge, breast lump(s), breast pain and breast self exam. Associated symptoms include abnormal vaginal bleeding. Pertinent negatives include abnormal bleeding (hematology), anxiety, decreased libido, depression, difficulty falling sleep, dyspareunia, history of infertility, nocturia, sexual dysfunction, sleep disturbances, urinary incontinence, urinary urgency, vaginal discharge and vaginal itching. Diet regular.The patient states her exercise level is  intermittent.   . The patient's tobacco use is:  Social History   Tobacco Use  Smoking Status Never  Smokeless Tobacco Never  . She has been exposed to passive smoke. The patient's alcohol use is:  Social History   Substance and Sexual Activity  Alcohol Use Yes   Comment: occ    Review of Systems  Constitutional: Negative.   HENT: Negative.    Eyes: Negative.  Negative for blurred vision.  Respiratory: Negative.    Cardiovascular: Negative.  Negative for chest pain.  Gastrointestinal: Negative.   Endocrine: Negative.   Genitourinary: Negative.   Musculoskeletal: Negative.  Negative for neck pain.  Skin:  Negative.   Allergic/Immunologic: Negative.   Neurological: Negative.  Negative for headaches.  Hematological: Negative.   Psychiatric/Behavioral: Negative.    All other systems reviewed and are negative.   Today's Vitals   09/01/21 1216  BP: 132/80  Pulse: (!) 105  Temp: 98.1 F (36.7 C)  Weight: 271 lb (122.9 kg)  Height: $Remove'5\' 3"'ptDMBYH$  (1.6 m)   Body mass index is 48.01 kg/m.  Wt Readings from Last 3 Encounters:  09/01/21 271 lb (122.9 kg)  05/01/21 276 lb (125.2 kg)  09/26/19 245 lb (111.1 kg)    BP Readings from Last 3 Encounters:  09/01/21 132/80  05/01/21 (!) 142/89  09/26/19 127/82    Objective:  Physical Exam Vitals and nursing note reviewed.  Constitutional:      Appearance: Normal appearance. She is obese.  HENT:     Head: Normocephalic and atraumatic.     Nose:     Comments: Masked     Mouth/Throat:     Comments: Masked  Eyes:     Extraocular Movements: Extraocular movements intact.  Cardiovascular:     Rate and Rhythm: Normal rate and regular rhythm.     Heart sounds: Normal heart sounds.  Pulmonary:     Effort: Pulmonary effort is normal.     Breath sounds: Normal breath sounds.  Musculoskeletal:     Cervical back: Normal range of motion.  Skin:    General: Skin is warm.  Neurological:     General: No focal deficit present.     Mental Status: She is alert.  Psychiatric:        Mood and Affect: Mood normal.        Behavior: Behavior normal.        Assessment And Plan:     1. Essential hypertension, benign Comments: Chronic, controlled. EKG performed, NSR w/o acute changes. Encouraged to follow low sodium diet. NO med changes today. She will f/u in 6 months.  - CMP14+EGFR - CBC - Lipid panel - POCT Urinalysis Dipstick (81002) - POCT UA - Microalbumin - EKG 12-Lead  2. Weight gain Comments: She gained 31 lbs between Oct '20 and June '22. She was congratulated on her 5 lb weight loss since June. Encouraged to aim for at least 150 min  exercise/week.  - TSH - Hemoglobin A1c - Insulin, random(561)  3. Snoring Comments: She agrees to sleep study evaluation. She admits to fatigue, non-restorative sleep, headaches and nocturia.  - Ambulatory referral to Neurology  4. Tachycardia Comments: HR 105, denies palpitations. I will check electrolytes, encouraged to stay well hydrated.   5. Class 3 severe obesity due to excess calories with serious comorbidity and body mass index (BMI) of 45.0 to 49.9 in adult [facility]) Comments: BMI 48.  We discussed the use of Semaglutide for obesity. She denies family/personal h/o thyroid cancer. She was given Ozempic samples and advised how to administer the medication, 0.$RemoveBeforeDEI'25mg'MTrzgyQPDkAufEOw$  weekly. She understands that she will not take  her next dose until next week. She is reminded to stop eating when full. She will rto in 4 weeks for re-evaluation.  Possible side effects d/w patient.   6. Encounter to establish care  Patient was given opportunity to ask questions. Patient verbalized understanding of the plan and was able to repeat key elements of the plan. All questions were answered to their satisfaction.   I, [name], MD, have reviewed all documentation for this visit. The documentation on 09/19/21 for the exam, diagnosis, procedures, and orders are all accurate and complete.   THE PATIENT IS ENCOURAGED TO PRACTICE SOCIAL DISTANCING DUE TO THE COVID-19 PANDEMIC.

## 2021-09-02 LAB — LIPID PANEL
Chol/HDL Ratio: 3.4 ratio (ref 0.0–4.4)
Cholesterol, Total: 168 mg/dL (ref 100–199)
HDL: 50 mg/dL (ref >39)
LDL Chol Calc (NIH): 108 mg/dL — ABNORMAL HIGH (ref 0–99)
Triglycerides: 47 mg/dL (ref 0–149)
VLDL Cholesterol Cal: 10 mg/dL (ref 5–40)

## 2021-09-02 LAB — INSULIN, RANDOM: INSULIN: 50.6 u[IU]/mL — ABNORMAL HIGH (ref 2.6–24.9)

## 2021-09-02 LAB — HEMOGLOBIN A1C
Est. average glucose Bld gHb Est-mCnc: 123 mg/dL
Hgb A1c MFr Bld: 5.9 % — ABNORMAL HIGH (ref 4.8–5.6)

## 2021-09-02 LAB — CMP14+EGFR
ALT: 21 IU/L (ref 0–32)
AST: 17 IU/L (ref 0–40)
Albumin/Globulin Ratio: 1.4 (ref 1.2–2.2)
Albumin: 4.6 g/dL (ref 3.9–5.0)
Alkaline Phosphatase: 58 IU/L (ref 44–121)
BUN/Creatinine Ratio: 7 — ABNORMAL LOW (ref 9–23)
BUN: 6 mg/dL (ref 6–20)
Bilirubin Total: 0.6 mg/dL (ref 0.0–1.2)
CO2: 23 mmol/L (ref 20–29)
Calcium: 9.5 mg/dL (ref 8.7–10.2)
Chloride: 102 mmol/L (ref 96–106)
Creatinine, Ser: 0.92 mg/dL (ref 0.57–1.00)
Globulin, Total: 3.4 g/dL (ref 1.5–4.5)
Glucose: 111 mg/dL — ABNORMAL HIGH (ref 70–99)
Potassium: 4.7 mmol/L (ref 3.5–5.2)
Sodium: 138 mmol/L (ref 134–144)
Total Protein: 8 g/dL (ref 6.0–8.5)
eGFR: 86 mL/min/{1.73_m2} (ref >59)

## 2021-09-02 LAB — TSH: TSH: 1.96 u[IU]/mL (ref 0.450–4.500)

## 2021-09-02 LAB — CBC
Hematocrit: 41.4 % (ref 34.0–46.6)
Hemoglobin: 13.5 g/dL (ref 11.1–15.9)
MCH: 26.5 pg — ABNORMAL LOW (ref 26.6–33.0)
MCHC: 32.6 g/dL (ref 31.5–35.7)
MCV: 81 fL (ref 79–97)
Platelets: 194 10*3/uL (ref 150–450)
RBC: 5.09 x10E6/uL (ref 3.77–5.28)
RDW: 15.8 % — ABNORMAL HIGH (ref 11.7–15.4)
WBC: 4.2 10*3/uL (ref 3.4–10.8)

## 2021-09-19 DIAGNOSIS — F330 Major depressive disorder, recurrent, mild: Secondary | ICD-10-CM | POA: Insufficient documentation

## 2021-09-19 DIAGNOSIS — D573 Sickle-cell trait: Secondary | ICD-10-CM | POA: Insufficient documentation

## 2021-09-19 DIAGNOSIS — I272 Pulmonary hypertension, unspecified: Secondary | ICD-10-CM | POA: Insufficient documentation

## 2021-09-30 ENCOUNTER — Encounter: Payer: Self-pay | Admitting: Internal Medicine

## 2021-09-30 ENCOUNTER — Other Ambulatory Visit: Payer: Self-pay

## 2021-09-30 ENCOUNTER — Ambulatory Visit (INDEPENDENT_AMBULATORY_CARE_PROVIDER_SITE_OTHER): Payer: 59 | Admitting: Internal Medicine

## 2021-09-30 VITALS — BP 126/78 | HR 83 | Temp 98.4°F | Ht 63.0 in | Wt 266.6 lb

## 2021-09-30 DIAGNOSIS — R0683 Snoring: Secondary | ICD-10-CM

## 2021-09-30 DIAGNOSIS — G44219 Episodic tension-type headache, not intractable: Secondary | ICD-10-CM

## 2021-09-30 DIAGNOSIS — I10 Essential (primary) hypertension: Secondary | ICD-10-CM

## 2021-09-30 DIAGNOSIS — Z6841 Body Mass Index (BMI) 40.0 and over, adult: Secondary | ICD-10-CM

## 2021-09-30 MED ORDER — OZEMPIC (0.25 OR 0.5 MG/DOSE) 2 MG/1.5ML ~~LOC~~ SOPN: 0.5000 mg | PEN_INJECTOR | SUBCUTANEOUS | 1 refills | Status: DC

## 2021-09-30 NOTE — Patient Instructions (Signed)

## 2021-09-30 NOTE — Progress Notes (Signed)
I,Katawbba Wiggins,acting as a Neurosurgeon for [name], MD.,have documented all relevant documentation on the behalf of [name], MD,as directed by  [name], MD while in the presence of [name], MD.  This visit occurred during the SARS-CoV-2 public health emergency.  Safety protocols were in place, including screening questions prior to the visit, additional usage of staff PPE, and extensive cleaning of exam room while observing appropriate contact time as indicated for disinfecting solutions.  Subjective:     [name] , female    DOB: 16-Nov-1991 , 30 y.o.   MRN: [id number]   Chief Complaint  Patient presents with   Obesity    HPI  The patient is here for a follow-up on her blood pressure and weight.  She reports compliance with meds. Recently started semaglutide for obesity. She has experienced some nausea, decreased appetite. Does not wish to stop the medication.   Hypertension This is a chronic problem. The current episode started more than 1 year ago. The problem has been gradually improving since onset. The problem is controlled. Associated symptoms include headaches. Pertinent negatives include no blurred vision, chest pain or neck pain. Risk factors for coronary artery disease include obesity. The current treatment provides moderate improvement.    Past Medical History:  Diagnosis Date   Anxiety    Back pain    Chest pain    Depression    Edema, lower extremity    Fluid collection (edema) in the arms, legs, hands and feet    Hypertension    Pericardial effusion    Sleep apnea    SOB (shortness of breath)      Family History  Problem Relation Age of Onset   Healthy Mother    High blood pressure Father    Sickle cell trait Father      Current Outpatient Medications:    amLODipine (NORVASC) 10 MG tablet, Take 10 mg by mouth daily., Disp: , Rfl: 0   carvedilol (COREG) 25 MG tablet, Take 25 mg by mouth 2 (two) times daily  with a meal., Disp: , Rfl:    Ferrous Sulfate 27 MG TABS, Take by mouth., Disp: , Rfl:    norethindrone (AYGESTIN) 5 MG tablet, Take 2 tablets by mouth daily., Disp: , Rfl:    minocycline (DYNACIN) 50 MG tablet, Take 1 tablet (50 mg total) by mouth 2 (two) times daily. (Patient taking differently: Take 50 mg by mouth 2 (two) times daily. Once per day), Disp: 60 tablet, Rfl: 0   Semaglutide,0.25 or 0.5MG /DOS, (OZEMPIC, 0.25 OR 0.5 MG/DOSE,) 2 MG/1.5ML SOPN, Inject 0.5 mg into the skin once a week., Disp: 1.5 mL, Rfl: 1   spironolactone (ALDACTONE) 25 MG tablet, Take 25 mg by mouth daily. (Patient not taking: Reported on 09/30/2021), Disp: , Rfl:    Allergies  Allergen Reactions   Mushroom Extract Complex Other (See Comments)    Flu like symptoms      Review of Systems  Constitutional: Negative.   Eyes:  Negative for blurred vision.  Respiratory: Negative.    Cardiovascular: Negative.  Negative for chest pain.  Gastrointestinal: Negative.   Musculoskeletal:  Negative for neck pain.  Neurological:  Positive for headaches.       She c/o occ. Headaches. Sometimes in temporal area/back of neck. Denies visual disturbances. Admits she is on computer a lot for work and school. Has no previous diagnosis of migraines. Unable to determine triggers.   Psychiatric/Behavioral: Negative.  All other systems reviewed and are negative.   Today's Vitals   09/30/21 1158  BP: 126/78  Pulse: 83  Temp: 98.4 F (36.9 C)  Weight: 266 lb 9.6 oz (120.9 kg)  Height: 5\' 3"  (1.6 m)   Body mass index is 47.23 kg/m.  Wt Readings from Last 3 Encounters:  09/30/21 266 lb 9.6 oz (120.9 kg)  09/01/21 271 lb (122.9 kg)  05/01/21 276 lb (125.2 kg)    BP Readings from Last 3 Encounters:  09/30/21 126/78  09/01/21 132/80  05/01/21 (!) 142/89    Objective:  Physical Exam Vitals and nursing note reviewed.  Constitutional:      Appearance: Normal appearance. She is obese.  HENT:     Head: Normocephalic and  atraumatic.     Nose:     Comments: Masked     Mouth/Throat:     Comments: Masked  Neck:     Comments: Paraspinal cervical tenderness to palpation.  Cardiovascular:     Rate and Rhythm: Normal rate and regular rhythm.     Heart sounds: Normal heart sounds.  Pulmonary:     Effort: Pulmonary effort is normal.     Breath sounds: Normal breath sounds.  Musculoskeletal:     Cervical back: Normal range of motion. Muscular tenderness present.  Skin:    General: Skin is warm.  Neurological:     General: No focal deficit present.     Mental Status: She is alert.  Psychiatric:        Mood and Affect: Mood normal.        Behavior: Behavior normal.        Assessment And Plan:     1. Class 3 severe obesity due to excess calories with serious comorbidity and body mass index (BMI) of 45.0 to 49.9 in adult [facility]) Comments: BMI 47. She was congratulated on her 5 lb weight loss since Oct 2022. She would like to c/w semaglutide. Reminded to stop eating when full. She will continue with 0.25mg  weekly dosage for now. She will f/u 8 wks.   2. Essential hypertension, benign Comments: Chronic, well controlled. Encouraged to follow low sodium diet. No med changes today.   3. Snoring Comments: She has yet to schedule appt b/c prior balance. I will refer her to another provider as requested.  - Ambulatory referral to Sleep Studies  4. Episodic tension-type headache, not intractable Comments: Advised to apply topical pain cream to base of neck/shoulders nightly. We also discussed stretching techniques, encouraged to do after working on computer.    Patient was given opportunity to ask questions. Patient verbalized understanding of the plan and was able to repeat key elements of the plan. All questions were answered to their satisfaction.   I, Nov 2022, MD, have reviewed all documentation for this visit. The documentation on 10/05/21 for the exam, diagnosis, procedures, and orders are all  accurate and complete.   IF YOU HAVE BEEN REFERRED TO A SPECIALIST, IT MAY TAKE 1-2 WEEKS TO SCHEDULE/PROCESS THE REFERRAL. IF YOU HAVE NOT HEARD FROM US/SPECIALIST IN TWO WEEKS, PLEASE GIVE 13/08/22 A CALL AT [phone].   THE PATIENT IS ENCOURAGED TO PRACTICE SOCIAL DISTANCING DUE TO THE COVID-19 PANDEMIC.

## 2021-12-08 ENCOUNTER — Other Ambulatory Visit: Payer: Self-pay

## 2021-12-08 ENCOUNTER — Ambulatory Visit (INDEPENDENT_AMBULATORY_CARE_PROVIDER_SITE_OTHER): Payer: 59 | Admitting: Internal Medicine

## 2021-12-08 ENCOUNTER — Encounter: Payer: Self-pay | Admitting: Internal Medicine

## 2021-12-08 VITALS — BP 130/80 | HR 76 | Temp 99.5°F | Ht 63.0 in | Wt 270.8 lb

## 2021-12-08 DIAGNOSIS — Z6841 Body Mass Index (BMI) 40.0 and over, adult: Secondary | ICD-10-CM

## 2021-12-08 DIAGNOSIS — Z2821 Immunization not carried out because of patient refusal: Secondary | ICD-10-CM | POA: Diagnosis not present

## 2021-12-08 DIAGNOSIS — I272 Pulmonary hypertension, unspecified: Secondary | ICD-10-CM

## 2021-12-08 DIAGNOSIS — D573 Sickle-cell trait: Secondary | ICD-10-CM

## 2021-12-08 MED ORDER — WEGOVY 0.5 MG/0.5ML ~~LOC~~ SOAJ: 0.5000 mg | SUBCUTANEOUS | 1 refills | Status: DC

## 2021-12-08 NOTE — Patient Instructions (Signed)

## 2021-12-08 NOTE — Progress Notes (Signed)
I,Katawbba Wiggins,acting as a Education administrator for Kimberly Greenland, MD.,have documented all relevant documentation on the behalf of Kimberly Greenland, MD,as directed by  Kimberly Greenland, MD while in the presence of Kimberly Greenland, MD.  This visit occurred during the SARS-CoV-2 public health emergency.  Safety protocols were in place, including screening questions prior to the visit, additional usage of staff PPE, and extensive cleaning of exam room while observing appropriate contact time as indicated for disinfecting solutions.  Subjective:     [name] , female    DOB: [birth year] , 31 y.o.   MRN: [id number]   Chief Complaint  Patient presents with   Obesity    HPI  She presents today for f/u obesity. She has been taking semaglutide/Ozempic 0.25mg  weekly without any issues. She is not sure if her insurance covers [phone].     Past Medical History:  Diagnosis Date   Anxiety    Back pain    Chest pain    Depression    Edema, lower extremity    Fluid collection (edema) in the arms, legs, hands and feet    Hypertension    Pericardial effusion    Sleep apnea    SOB (shortness of breath)      Family History  Problem Relation Age of Onset   Healthy Mother    High blood pressure Father    Sickle cell trait Father      Current Outpatient Medications:    amLODipine (NORVASC) 10 MG tablet, Take 10 mg by mouth daily., Disp: , Rfl: 0   carvedilol (COREG) 25 MG tablet, Take 25 mg by mouth 2 (two) times daily with a meal., Disp: , Rfl:    minocycline (DYNACIN) 50 MG tablet, Take 1 tablet (50 mg total) by mouth 2 (two) times daily. (Patient taking differently: Take 50 mg by mouth 2 (two) times daily. Once per day), Disp: 60 tablet, Rfl: 0   Semaglutide-Weight Management (WEGOVY) 0.5 MG/0.5ML SOAJ, Inject 0.5 mg into the skin once a week., Disp: 2 mL, Rfl: 1   Ferrous Sulfate 27 MG TABS, Take by mouth. (Patient not taking: Reported on 12/08/2021), Disp: , Rfl:    norethindrone  (AYGESTIN) 5 MG tablet, Take 2 tablets by mouth daily. 1 time per day, Disp: , Rfl:    Allergies  Allergen Reactions   Mushroom Extract Complex Other (See Comments)    Flu like symptoms      Review of Systems  Constitutional: Negative.   Respiratory: Negative.    Cardiovascular: Negative.   Gastrointestinal: Negative.   Neurological: Negative.   Psychiatric/Behavioral: Negative.      Today's Vitals   12/08/21 1602  BP: 130/80  Pulse: 76  Temp: 99.5 F (37.5 C)  Weight: 270 lb 12.8 oz (122.8 kg)  Height: 5\' 3"  (1.6 m)   Body mass index is 47.97 kg/m.  Wt Readings from Last 3 Encounters:  12/08/21 270 lb 12.8 oz (122.8 kg)  09/30/21 266 lb 9.6 oz (120.9 kg)  09/01/21 271 lb (122.9 kg)    BP Readings from Last 3 Encounters:  12/08/21 130/80  09/30/21 126/78  09/01/21 132/80    Objective:  Physical Exam Vitals and nursing note reviewed.  Constitutional:      Appearance: Normal appearance. She is obese.  HENT:     Head: Normocephalic and atraumatic.     Nose:     Comments: Masked     Mouth/Throat:     Comments: Masked  Eyes:     Extraocular Movements: Extraocular movements intact.  Cardiovascular:     Rate and Rhythm: Normal rate and regular rhythm.     Heart sounds: Normal heart sounds.  Pulmonary:     Effort: Pulmonary effort is normal.     Breath sounds: Normal breath sounds.  Musculoskeletal:     Cervical back: Normal range of motion.  Skin:    General: Skin is warm.  Neurological:     General: No focal deficit present.     Mental Status: She is alert.  Psychiatric:        Mood and Affect: Mood normal.        Behavior: Behavior normal.        Assessment And Plan:     1. Class 3 severe obesity due to excess calories with serious comorbidity and body mass index (BMI) of 45.0 to 49.9 in adult [facility]) Comments: She has gained 4 lbs since her last visit. She agrees to trying Wegovy (still semaglutide) to help with her weight loss efforts. Confirms neg  personal/family history of thyroid cancer. I will send Wegovy 0.5mg  weekly to her local pharmacy. She is advised that this will likely require a prior authorization. She will rto in 8 weeks for re-evaluation. Advised to aim for at least 150 minutes of exercise per week.    2. Sickle cell trait ([place]) Comments: Chronic, stable. She is encouraged to stay well hydrated.   3. Pulmonary hypertension ([facility]) Comments: Chronic, I will request her records from Dr. Terrence Dupont. She states she has not seen him "in some time"  I will refer her for echocardiogram and then to [facility] for further evaluation.   4. COVID-19 vaccine dose declined  Patient was given opportunity to ask questions. Patient verbalized understanding of the plan and was able to repeat key elements of the plan. All questions were answered to their satisfaction.   I, Kimberly Greenland, MD, have reviewed all documentation for this visit. The documentation on 12/11/21 for the exam, diagnosis, procedures, and orders are all accurate and complete.   IF YOU HAVE BEEN REFERRED TO A SPECIALIST, IT MAY TAKE 1-2 WEEKS TO SCHEDULE/PROCESS THE REFERRAL. IF YOU HAVE NOT HEARD FROM US/SPECIALIST IN TWO WEEKS, PLEASE GIVE [place] A CALL AT [phone].   THE PATIENT IS ENCOURAGED TO PRACTICE SOCIAL DISTANCING DUE TO THE COVID-19 PANDEMIC.

## 2021-12-23 ENCOUNTER — Encounter: Payer: Self-pay | Admitting: Internal Medicine

## 2021-12-31 IMAGING — DX DG CHEST 1V PORT
1 series · 1 of 1 positions shown · non-contrast
Comparison: Portable exam 0131 hours compared to 07/12/2017

CLINICAL DATA: Chest pain and headache for 3 days associated with
shortness of breath beginning today. Just began taking medication,
RIGHT gland

EXAM:
PORTABLE CHEST 1 VIEW

[chest ap]
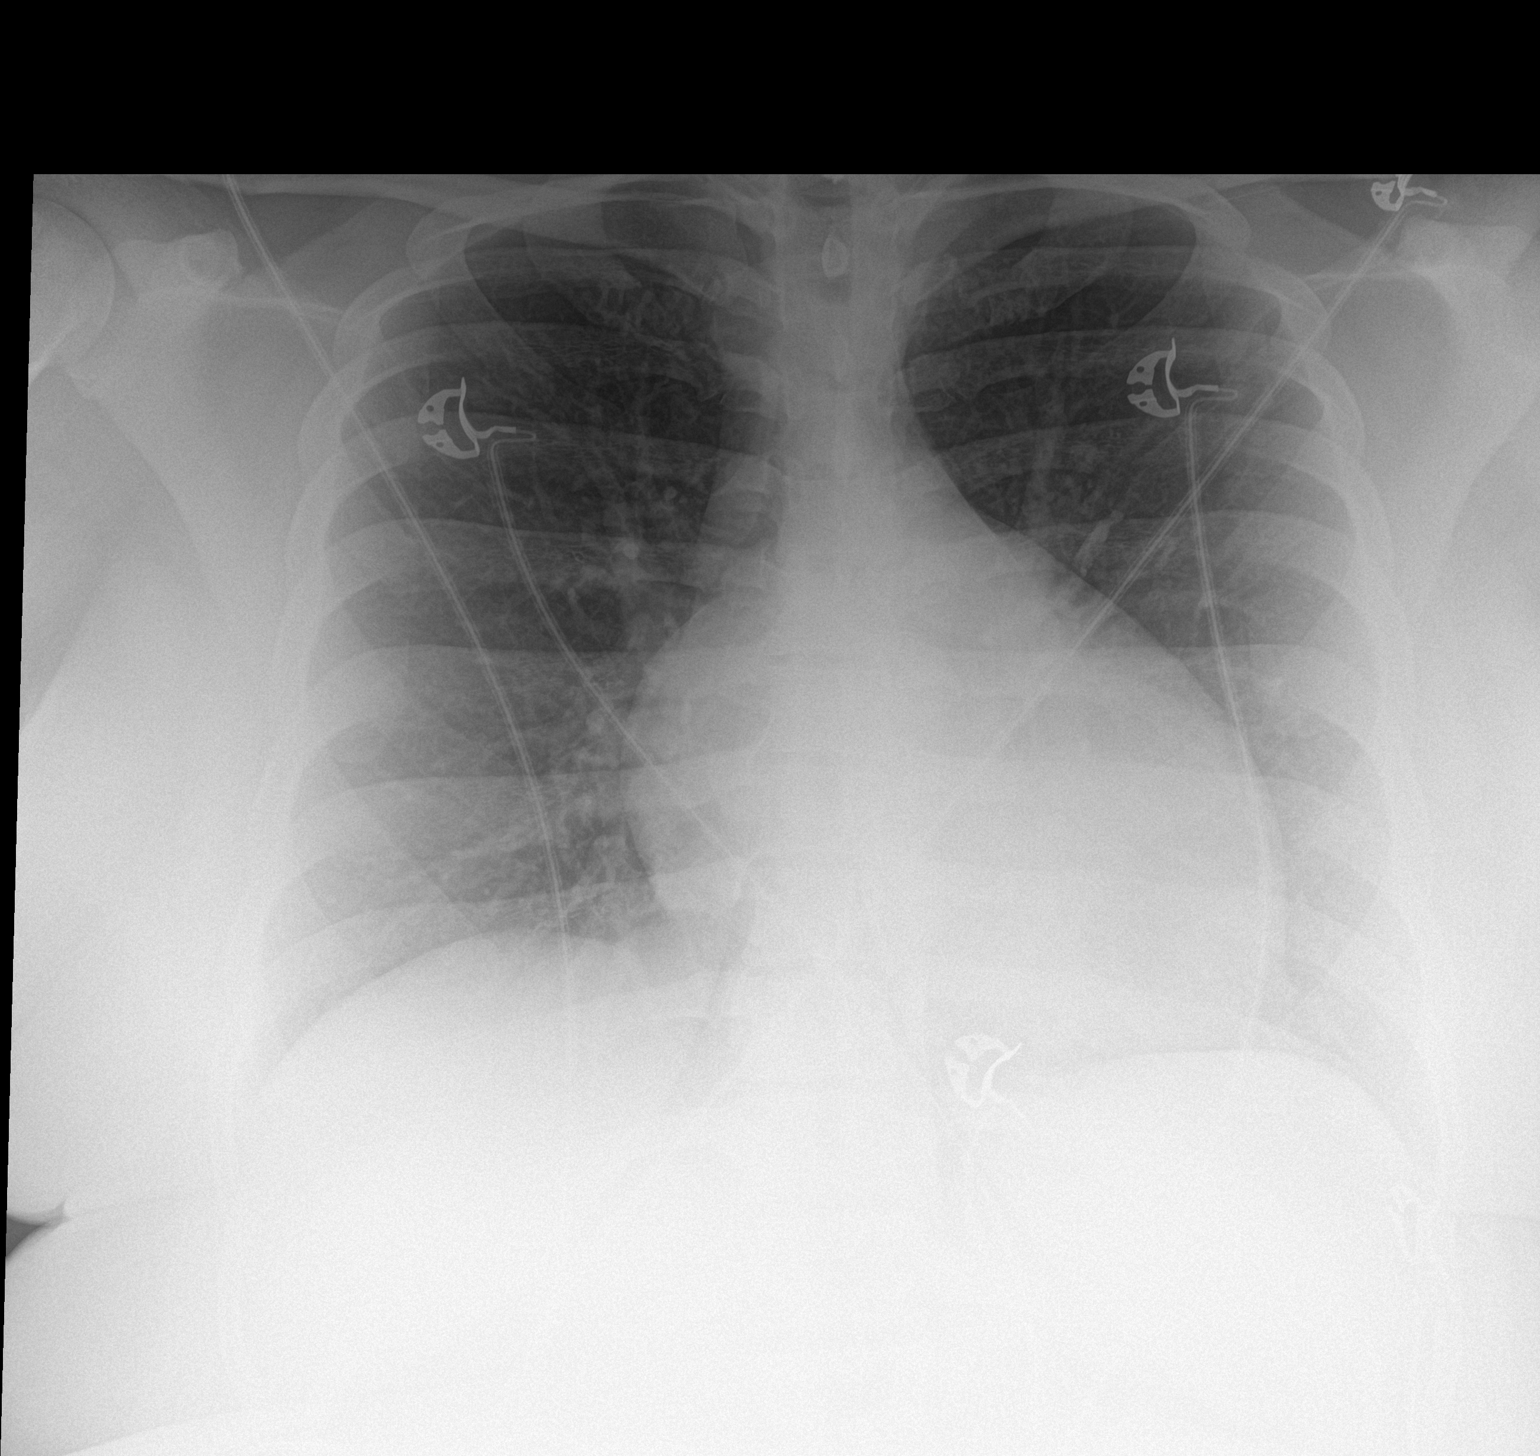

[1 of 1 positions shown; findings below may reference images not displayed]

FINDINGS: Upper normal size of cardiac silhouette.

Mediastinal contours and pulmonary vascularity normal.

Lungs clear.

No infiltrate, pleural effusion, or pneumothorax.

Osseous structures unremarkable.
IMPRESSION: No acute abnormalities.

## 2022-02-09 ENCOUNTER — Ambulatory Visit: Payer: 59 | Admitting: Internal Medicine

## 2022-02-17 ENCOUNTER — Other Ambulatory Visit: Payer: Self-pay

## 2022-02-17 ENCOUNTER — Telehealth: Payer: Self-pay

## 2022-02-17 ENCOUNTER — Ambulatory Visit (INDEPENDENT_AMBULATORY_CARE_PROVIDER_SITE_OTHER): Payer: 59 | Admitting: Internal Medicine

## 2022-02-17 ENCOUNTER — Encounter: Payer: Self-pay | Admitting: Internal Medicine

## 2022-02-17 VITALS — BP 124/80 | HR 98 | Temp 98.6°F | Ht 63.6 in | Wt 263.4 lb

## 2022-02-17 DIAGNOSIS — Z6841 Body Mass Index (BMI) 40.0 and over, adult: Secondary | ICD-10-CM | POA: Diagnosis not present

## 2022-02-17 DIAGNOSIS — R0981 Nasal congestion: Secondary | ICD-10-CM

## 2022-02-17 NOTE — Telephone Encounter (Signed)
PA sent to plan for wegovy  ?

## 2022-02-17 NOTE — Progress Notes (Signed)
?Jeri Cos Llittleton,acting as a Neurosurgeon for [name], MD.,have documented all relevant documentation on the behalf of [name], MD,as directed by  [name], MD while in the presence of [name], MD.  ?This visit occurred during the SARS-CoV-2 public health emergency.  Safety protocols were in place, including screening questions prior to the visit, additional usage of staff PPE, and extensive cleaning of exam room while observing appropriate contact time as indicated for disinfecting solutions. ? ?Subjective:  ?  ? [name] , female    DOB: 02-May-1991 , 31 y.o.   MRN: [id number] ? ? ?Chief Complaint  ?Patient presents with  ? Weight Check  ? ? ?HPI ? ?Patient presents today for a weight check. She stated she is tolerating the [facility] okay. Unfortunately, not yet approved by her insurance. She is still on 0.25mg  weekly. She has not had any issues with the medication.  ?  ? ?Past Medical History:  ?Diagnosis Date  ? Anxiety   ? Back pain   ? Chest pain   ? Depression   ? Edema, lower extremity   ? Fluid collection (edema) in the arms, legs, hands and feet   ? Hypertension   ? Pericardial effusion   ? Sleep apnea   ? SOB (shortness of breath)   ?  ? ?Family History  ?Problem Relation Age of Onset  ? Healthy Mother   ? High blood pressure Father   ? Sickle cell trait Father   ? ? ? ?Current Outpatient Medications:  ?  amLODipine (NORVASC) 10 MG tablet, Take 10 mg by mouth daily., Disp: , Rfl: 0 ?  carvedilol (COREG) 25 MG tablet, Take 25 mg by mouth 2 (two) times daily with a meal., Disp: , Rfl:  ?  norethindrone (AYGESTIN) 5 MG tablet, Take 2 tablets by mouth daily. 1 time per day, Disp: , Rfl:  ?  Semaglutide-Weight Management (WEGOVY) 0.5 MG/0.5ML SOAJ, Inject 0.5 mg into the skin once a week., Disp: 2 mL, Rfl: 1 ?  Ferrous Sulfate 27 MG TABS, Take by mouth. (Patient not taking: Reported on 12/08/2021), Disp: , Rfl:  ?  minocycline (DYNACIN) 50 MG tablet, Take 1 tablet  (50 mg total) by mouth 2 (two) times daily. (Patient not taking: Reported on 02/17/2022), Disp: 60 tablet, Rfl: 0  ? ?Allergies  ?Allergen Reactions  ? Mushroom Extract Complex Other (See Comments)  ?  Flu like symptoms   ?  ? ?Review of Systems  ?Constitutional: Negative.   ?Respiratory: Negative.    ?Cardiovascular: Negative.   ?Gastrointestinal: Negative.   ?Neurological: Negative.   ?Psychiatric/Behavioral: Negative.     ? ?Today's Vitals  ? 02/17/22 1138  ?BP: 124/80  ?Pulse: 98  ?Temp: 98.6 ?F (37 ?C)  ?Weight: 263 lb 6.4 oz (119.5 kg)  ?Height: 5' 3.6" (1.615 m)  ?PainSc: 0-No pain  ? ?Body mass index is 45.78 kg/m?.  ?Wt Readings from Last 3 Encounters:  ?02/17/22 263 lb 6.4 oz (119.5 kg)  ?12/08/21 270 lb 12.8 oz (122.8 kg)  ?09/30/21 266 lb 9.6 oz (120.9 kg)  ?  ? ?Objective:  ?Physical Exam ?Vitals and nursing note reviewed.  ?Constitutional:   ?   Appearance: Normal appearance. She is obese.  ?HENT:  ?   Head: Normocephalic and atraumatic.  ?   Nose:  ?   Comments: Masked  ?   Mouth/Throat:  ?   Comments: Masked  ?Cardiovascular:  ?   Rate  and Rhythm: Normal rate and regular rhythm.  ?   Heart sounds: Normal heart sounds.  ?Pulmonary:  ?   Effort: Pulmonary effort is normal.  ?   Breath sounds: Normal breath sounds.  ?Musculoskeletal:  ?   Cervical back: Normal range of motion.  ?Skin: ?   General: Skin is warm.  ?Neurological:  ?   General: No focal deficit present.  ?   Mental Status: She is alert.  ?Psychiatric:     ?   Mood and Affect: Mood normal.     ?   Behavior: Behavior normal.  ?   ?Assessment And Plan:  ?   ?1. Class 3 severe obesity due to excess calories with serious comorbidity and body mass index (BMI) of 45.0 to 49.9 in adult [facility]) ?Comments: She was congratulated on her 7 lb weight loss since Jan 2023.  She is reminded to aim for at least 150 minutes of exercise per week. Rx not yet approved. I will check on prior authorization later today. She is willing to switch to Saxenda if needed.   ? ?2. Sinus congestion ?Comments: She is advised to decrease her dairy intake. She will let me know if her sx persist.  ?  ?Patient was given opportunity to ask questions. Patient verbalized understanding of the plan and was able to repeat key elements of the plan. All questions were answered to their satisfaction.  ? ?I, [name], MD, have reviewed all documentation for this visit. The documentation on 02/17/22 for the exam, diagnosis, procedures, and orders are all accurate and complete.  ? ?IF YOU HAVE BEEN REFERRED TO A SPECIALIST, IT MAY TAKE 1-2 WEEKS TO SCHEDULE/PROCESS THE REFERRAL. IF YOU HAVE NOT HEARD FROM US/SPECIALIST IN TWO WEEKS, PLEASE GIVE [place] A CALL AT [phone].  ? ?THE PATIENT IS ENCOURAGED TO PRACTICE SOCIAL DISTANCING DUE TO THE COVID-19 PANDEMIC.   ?

## 2022-02-17 NOTE — Patient Instructions (Signed)

## 2022-02-22 ENCOUNTER — Telehealth: Payer: Self-pay

## 2022-02-22 NOTE — Telephone Encounter (Signed)
Pt notified sample of Reginal Lutes is available for pickup and that Dr. Allyne Gee said for the pt to stay at the 0.25mg  weekly for now. ?

## 2022-03-31 ENCOUNTER — Encounter: Payer: Self-pay | Admitting: Primary Care

## 2022-03-31 ENCOUNTER — Ambulatory Visit (INDEPENDENT_AMBULATORY_CARE_PROVIDER_SITE_OTHER): Payer: 59 | Admitting: Primary Care

## 2022-03-31 ENCOUNTER — Ambulatory Visit (INDEPENDENT_AMBULATORY_CARE_PROVIDER_SITE_OTHER): Payer: 59

## 2022-03-31 VITALS — BP 142/78 | HR 94 | Temp 98.2°F | Ht 63.0 in | Wt 266.2 lb

## 2022-03-31 DIAGNOSIS — R0602 Shortness of breath: Secondary | ICD-10-CM

## 2022-03-31 DIAGNOSIS — R0683 Snoring: Secondary | ICD-10-CM | POA: Diagnosis not present

## 2022-03-31 NOTE — Patient Instructions (Addendum)
Sleep apnea is defined as period of 10 seconds or longer when you stop breathing at night. This can happen multiple times a night. Dx sleep apnea is when this occurs more than 5 times on average an hour.  ?  ?Mild OSA 5-15 apneic events an hour ?Moderate OSA 15-30 apneic events an hour ?Severe OSA > 30 apneic events an hour ?  ?Untreated sleep apnea puts you at higher risk for cardiac arrhythmias, pulmonary HTN, stroke and diabetes ?  ?Treatment options include weight loss, side sleeping position, oral appliance, CPAP therapy or referral to ENT for possible surgical options  ?  ?Recommendations: ?- Focus on side sleeping position or elevate head of bed when sleeping ?- Continue to work on weight loss efforts  ?- Do not drive if experiencing excessive daytime sleepiness of fatigue  ?- Try Flonase or Nasacort nasal spray once daily/ also you can use saline irrigation (netti pot or ocean nasal spray) as needed for congestion  ?  ?Orders: ?Home sleep study  ?PFTs ?CXR ?  ?Follow-up: ?1-2 months with 1 hour PFT prior with [name]  ? ? ? ?Sleep Apnea ?Sleep apnea is a condition in which breathing pauses or becomes shallow during sleep. People with sleep apnea usually snore loudly. They may have times when they gasp and stop breathing for 10 seconds or more during sleep. This may happen many times during the night. ?Sleep apnea disrupts your sleep and keeps your body from getting the rest that it needs. This condition can increase your risk of certain health problems, including: ?Heart attack. ?Stroke. ?Obesity. ?Type 2 diabetes. ?Heart failure. ?Irregular heartbeat. ?High blood pressure. ?The goal of treatment is to help you breathe normally again. ?What are the causes? ? ?The most common cause of sleep apnea is a collapsed or blocked airway. ?There are three kinds of sleep apnea: ?Obstructive sleep apnea. This kind is caused by a blocked or collapsed airway. ?Central sleep apnea. This kind happens when the part of the brain  that controls breathing does not send the correct signals to the muscles that control breathing. ?Mixed sleep apnea. This is a combination of obstructive and central sleep apnea. ?What increases the risk? ?You are more likely to develop this condition if you: ?Are overweight. ?Smoke. ?Have a smaller than normal airway. ?Are older. ?Are female. ?Drink alcohol. ?Take sedatives or tranquilizers. ?Have a family history of sleep apnea. ?Have a tongue or tonsils that are larger than normal. ?What are the signs or symptoms? ?Symptoms of this condition include: ?Trouble staying asleep. ?Loud snoring. ?Morning headaches. ?Waking up gasping. ?Dry mouth or sore throat in the morning. ?Daytime sleepiness and tiredness. ?If you have daytime fatigue because of sleep apnea, you may be more likely to have: ?Trouble concentrating. ?Forgetfulness. ?Irritability or mood swings. ?Personality changes. ?Feelings of depression. ?Sexual dysfunction. This may include loss of interest if you are female, or erectile dysfunction if you are female. ?How is this diagnosed? ?This condition may be diagnosed with: ?A medical history. ?A physical exam. ?A series of tests that are done while you are sleeping (sleep study). These tests are usually done in a sleep lab, but they may also be done at home. ?How is this treated? ?Treatment for this condition aims to restore normal breathing and to ease symptoms during sleep. It may involve managing health issues that can affect breathing, such as high blood pressure or obesity. Treatment may include: ?Sleeping on your side. ?Using a decongestant if you have nasal congestion. ?Avoiding  the use of depressants, including alcohol, sedatives, and narcotics. ?Losing weight if you are overweight. ?Making changes to your diet. ?Quitting smoking. ?Using a device to open your airway while you sleep, such as: ?An oral appliance. This is a custom-made mouthpiece that shifts your lower jaw forward. ?A continuous positive  airway pressure (CPAP) device. This device blows air through a mask when you breathe out (exhale). ?A nasal expiratory positive airway pressure (EPAP) device. This device has valves that you put into each nostril. ?A bi-level positive airway pressure (BIPAP) device. This device blows air through a mask when you breathe in (inhale) and breathe out (exhale). ?Having surgery if other treatments do not work. During surgery, excess tissue is removed to create a wider airway. ?Follow these instructions at home: ?Lifestyle ?Make any lifestyle changes that your health care provider recommends. ?Eat a healthy, well-balanced diet. ?Take steps to lose weight if you are overweight. ?Avoid using depressants, including alcohol, sedatives, and narcotics. ?Do not use any products that contain nicotine or tobacco. These products include cigarettes, chewing tobacco, and vaping devices, such as e-cigarettes. If you need help quitting, ask your health care provider. ?General instructions ?Take over-the-counter and prescription medicines only as told by your health care provider. ?If you were given a device to open your airway while you sleep, use it only as told by your health care provider. ?If you are having surgery, make sure to tell your health care provider you have sleep apnea. You may need to bring your device with you. ?Keep all follow-up visits. This is important. ?Contact a health care provider if: ?The device that you received to open your airway during sleep is uncomfortable or does not seem to be working. ?Your symptoms do not improve. ?Your symptoms get worse. ?Get help right away if: ?You develop: ?Chest pain. ?Shortness of breath. ?Discomfort in your back, arms, or stomach. ?You have: ?Trouble speaking. ?Weakness on one side of your body. ?Drooping in your face. ?These symptoms may represent a serious problem that is an emergency. Do not wait to see if the symptoms will go away. Get medical help right away. Call your  local emergency services (911 in the [place] Do not drive yourself to the hospital. ?Summary ?Sleep apnea is a condition in which breathing pauses or becomes shallow during sleep. ?The most common cause is a collapsed or blocked airway. ?The goal of treatment is to restore normal breathing and to ease symptoms during sleep. ?This information is not intended to replace advice given to you by your health care provider. Make sure you discuss any questions you have with your health care provider. ?Document Revised: 06/23/2021 Document Reviewed: 10/23/2020 ?Elsevier Patient Education ? 2023 Elsevier Inc. ? ?

## 2022-03-31 NOTE — Progress Notes (Signed)
@Patient  ID: , female    DOB: 02-07-1991, 31 y.o.   MRN: [id number]  Chief Complaint  Patient presents with   Consult    Pt states she is having issues with breathing, coughing up mucus, and issues with sleeping. She states this is all the time. Pt is not taking anything for allergies or congestion. She states that this has been occurring for about 3 months now. Pt states she has issues falling asleep and staying asleep at night time. Pt states never had a sleep study done before, she does report snoring, dry mouth and morning headaches when she wakes up.     Referring provider: [id number], MD  HPI: 31 year old female, never smoked.  Past medical history significant for pulmonary hypertension, obesity, vitamin D deficiency, sickle cell trait.   03/31/2022 Patient presents today for sleep consult. Self referred. Patient reports trouble falling and staying asleep at night. Associated symptoms of snoring, dry mouth and morning headache. She has never had prior sleep study. She was recently started on Wegovy (semaglutide) to help assist with weight loss in March. Typical bedtime is between 12am-2am. It can take her anywhere from 30 mins to several hours to fall asleep. She wakes up 4-5 times a night. She starts her day at either 7:30am or 10am.   She also has issues with breathing. Symptoms have been present for 3 months after an upper respiratory infection. She has associated sinus and chest congestion. She is coughing up mucus. States that her breathing has been more labored. She does not feel it is related to her weight since she has lost weight over the last 6 months. No chest tightness or wheezing.   Sleep questionnaire Symptoms- Insomnia, restless sleep, snoring, dry mouth, morning headache  Prior sleep study- None Bedtime- 12am-2am Time to fall asleep- April to several hours Nocturnal awakenings- 4-5 times a night Out of bed/start of day- 7:30am or 10am Weight  changes- down this year Do you operate heavy machinery- No Do you currently wear CPAP- No Do you current wear oxygen- No Epworth- 12   Allergies  Allergen Reactions   Mushroom Extract Complex Other (See Comments)    Flu like symptoms      There is no immunization history on file for this patient.  Past Medical History:  Diagnosis Date   Anxiety    Back pain    Chest pain    Depression    Edema, lower extremity    Fluid collection (edema) in the arms, legs, hands and feet    Hypertension    Pericardial effusion    Sleep apnea    SOB (shortness of breath)     Tobacco History: Social History   Tobacco Use  Smoking Status Never  Smokeless Tobacco Never   Counseling given: Not Answered   Outpatient Medications Prior to Visit  Medication Sig Dispense Refill   amLODipine (NORVASC) 10 MG tablet Take 10 mg by mouth daily.  0   carvedilol (COREG) 25 MG tablet Take 25 mg by mouth 2 (two) times daily with a meal.     minocycline (DYNACIN) 50 MG tablet Take 1 tablet (50 mg total) by mouth 2 (two) times daily. 60 tablet 0   norethindrone (AYGESTIN) 5 MG tablet Take 2 tablets by mouth daily. 1 time per day     Semaglutide-Weight Management (WEGOVY) 0.5 MG/0.5ML SOAJ Inject 0.5 mg into the skin once a week. 2 mL 1   Ferrous Sulfate 27 MG TABS Take  by mouth.     No facility-administered medications prior to visit.   Review of Systems  Review of Systems  Constitutional: Negative.   Respiratory: Negative.    Cardiovascular: Negative.     Physical Exam  BP (!) 142/78 (BP Location: Left Arm, Patient Position: Sitting, Cuff Size: Normal)   Pulse 94   Temp 98.2 F (36.8 C) (Oral)   Ht 5\' 3"  (1.6 m)   Wt 266 lb 3.2 oz (120.7 kg)   SpO2 100%   BMI 47.16 kg/m  Physical Exam Constitutional:      General: She is not in acute distress.    Appearance: Normal appearance. She is not ill-appearing.  HENT:     Head: Normocephalic and atraumatic.  Cardiovascular:     Rate and  Rhythm: Normal rate and regular rhythm.  Pulmonary:     Effort: Pulmonary effort is normal.     Breath sounds: Normal breath sounds. No wheezing, rhonchi or rales.     Comments: CTA Musculoskeletal:        General: Normal range of motion.  Skin:    General: Skin is warm and dry.  Neurological:     General: No focal deficit present.     Mental Status: She is alert and oriented to person, place, and time. Mental status is at baseline.  Psychiatric:        Mood and Affect: Mood normal.        Behavior: Behavior normal.        Thought Content: Thought content normal.        Judgment: Judgment normal.     Lab Results:  CBC    Component Value Date/Time   WBC 4.2 09/01/2021 1252   WBC 4.5 05/01/2021 1250   RBC 5.09 09/01/2021 1252   RBC 5.02 05/01/2021 1250   HGB 13.5 09/01/2021 1252   HCT 41.4 09/01/2021 1252   PLT 194 09/01/2021 1252   MCV 81 09/01/2021 1252   MCH 26.5 (L) 09/01/2021 1252   MCH 27.1 05/01/2021 1250   MCHC 32.6 09/01/2021 1252   MCHC 34.3 05/01/2021 1250   RDW 15.8 (H) 09/01/2021 1252   LYMPHSABS 1.9 05/01/2021 1250   LYMPHSABS 1.7 05/08/2019 0000   MONOABS 0.4 05/01/2021 1250   EOSABS 0.1 05/01/2021 1250   EOSABS 0.0 05/08/2019 0000   BASOSABS 0.0 05/01/2021 1250   BASOSABS 0.0 05/08/2019 0000    BMET    Component Value Date/Time   NA 143 04/06/2022 1445   K 4.2 04/06/2022 1445   CL 105 04/06/2022 1445   CO2 23 04/06/2022 1445   GLUCOSE 90 04/06/2022 1445   GLUCOSE 126 (H) 05/01/2021 1250   BUN 6 04/06/2022 1445   CREATININE 0.90 04/06/2022 1445   CALCIUM 9.2 04/06/2022 1445   GFRNONAA >60 05/01/2021 1250   GFRAA 117 05/08/2019 0000    BNP    Component Value Date/Time   BNP 11.3 05/01/2021 1250    ProBNP No results found for: PROBNP  Imaging: DG Chest 2 View  Result Date: 03/31/2022 CLINICAL DATA:  Shortness of breath. EXAM: CHEST - 2 VIEW COMPARISON:  05/01/2021 FINDINGS: The heart size and mediastinal contours are within normal  limits. Both lungs are clear. The visualized skeletal structures are unremarkable. IMPRESSION: Normal exam. Electronically Signed   By: 07/01/2021 M.D.   On: 03/31/2022 17:22   PCV ECHOCARDIOGRAM COMPLETE  Result Date: 04/23/2022 Echocardiogram 04/22/2022: Left ventricle cavity is normal in size and wall thickness. Normal global wall motion.  Normal LV systolic function with EF 56%. Normal diastolic filling pattern. No significant valvular abnormality. IVC not seen. Previously reported insignificant pericardial effusion and mild TR in 2019 not appreciated on this study.    Assessment & Plan:   Loud snoring - Patient as symptoms of loud snoring, restless sleep, insomnia, dry mouth and morning headache. Epworth 12. Concern patient could have sleep apnea, needs home sleep study to evaluate. Discussed risks of untreated sleep apnea including cardiac arrhthymias, stroke, pulm HTN and DM. Reviewed treatment options. Encourage weight loss and side sleeping position. Advised against driving if experiencing excessive daytime sleepiness. FU in 4-6 weeks or sooner if needed.   Shortness of breath - Patient has has shortness of breath symptoms since having URI 3 months ago. Associated sinus/ chest congestion and cough. No chest tightness or wheezing. Recommend checking CXR and PFTs.      Glenford BayleyElizabeth W Catheryne Deford, NP 04/27/2022

## 2022-04-01 NOTE — Progress Notes (Signed)
Pleas let patient know CXR was normal. Lungs were clear. Heart size was normal.

## 2022-04-06 ENCOUNTER — Encounter: Payer: Self-pay | Admitting: Internal Medicine

## 2022-04-06 ENCOUNTER — Other Ambulatory Visit: Payer: Self-pay | Admitting: Internal Medicine

## 2022-04-06 ENCOUNTER — Ambulatory Visit (INDEPENDENT_AMBULATORY_CARE_PROVIDER_SITE_OTHER): Payer: 59 | Admitting: Internal Medicine

## 2022-04-06 VITALS — BP 140/80 | HR 95 | Temp 98.3°F | Ht 63.0 in | Wt 266.0 lb

## 2022-04-06 DIAGNOSIS — K30 Functional dyspepsia: Secondary | ICD-10-CM | POA: Diagnosis not present

## 2022-04-06 DIAGNOSIS — I10 Essential (primary) hypertension: Secondary | ICD-10-CM

## 2022-04-06 DIAGNOSIS — E66813 Obesity, class 3: Secondary | ICD-10-CM

## 2022-04-06 DIAGNOSIS — Z2821 Immunization not carried out because of patient refusal: Secondary | ICD-10-CM

## 2022-04-06 DIAGNOSIS — I272 Pulmonary hypertension, unspecified: Secondary | ICD-10-CM | POA: Diagnosis not present

## 2022-04-06 DIAGNOSIS — Z6841 Body Mass Index (BMI) 40.0 and over, adult: Secondary | ICD-10-CM

## 2022-04-06 MED ORDER — FAMOTIDINE 20 MG PO TABS: 20.0000 mg | ORAL_TABLET | Freq: Every day | ORAL | 1 refills | Status: DC

## 2022-04-06 NOTE — Patient Instructions (Signed)

## 2022-04-06 NOTE — Progress Notes (Signed)
?Rich Brave Llittleton,acting as a Education administrator for [name], MD.,have documented all relevant documentation on the behalf of [name], MD,as directed by  [name], MD while in the presence of [name], MD.  ?This visit occurred during the SARS-CoV-2 public health emergency.  Safety protocols were in place, including screening questions prior to the visit, additional usage of staff PPE, and extensive cleaning of exam room while observing appropriate contact time as indicated for disinfecting solutions. ? ?Subjective:  ?  ? [name] , female    DOB: 07/03/1991 , 31 y.o.   MRN: [id number] ? ? ?Chief Complaint  ?Patient presents with  ? Weight Check  ? Heartburn  ? ? ?HPI ? ?Patient presents today for a weight check. Patient reports she has been getting samples of Wegovy 0.63m due to her insurance not covering the medication. She reports she is tolerating the medication well.  However, she does add that she has noticed more heartburn. This is not something she has experienced with any regularity in the past.  ?  ? ?Past Medical History:  ?Diagnosis Date  ? Anxiety   ? Back pain   ? Chest pain   ? Depression   ? Edema, lower extremity   ? Fluid collection (edema) in the arms, legs, hands and feet   ? Hypertension   ? Pericardial effusion   ? Sleep apnea   ? SOB (shortness of breath)   ?  ? ?Family History  ?Problem Relation Age of Onset  ? Healthy Mother   ? High blood pressure Father   ? Sickle cell trait Father   ? ? ? ?Current Outpatient Medications:  ?  amLODipine (NORVASC) 10 MG tablet, Take 10 mg by mouth daily., Disp: , Rfl: 0 ?  carvedilol (COREG) 25 MG tablet, Take 25 mg by mouth 2 (two) times daily with a meal., Disp: , Rfl:  ?  minocycline (DYNACIN) 50 MG tablet, Take 1 tablet (50 mg total) by mouth 2 (two) times daily., Disp: 60 tablet, Rfl: 0 ?  norethindrone (AYGESTIN) 5 MG tablet, Take 2 tablets by mouth daily. 1 time per day, Disp: , Rfl:  ?  famotidine (PEPCID)  20 MG tablet, TAKE 1 TABLET(20 MG) BY MOUTH DAILY, Disp: 90 tablet, Rfl: 1 ?  Semaglutide-Weight Management (WEGOVY) 0.5 MG/0.5ML SOAJ, Inject 0.5 mg into the skin once a week. (Patient not taking: Reported on 04/06/2022), Disp: 2 mL, Rfl: 1  ? ?Allergies  ?Allergen Reactions  ? Mushroom Extract Complex Other (See Comments)  ?  Flu like symptoms   ?  ? ?Review of Systems  ?Constitutional: Negative.   ?Respiratory: Negative.    ?Cardiovascular: Negative.   ?Gastrointestinal: Negative.   ?     C/o indigestion  ?Neurological: Negative.   ?Psychiatric/Behavioral: Negative.     ? ?Today's Vitals  ? 04/06/22 1410 04/06/22 1420  ?BP: (!) 146/90 140/80  ?Pulse: 95   ?Temp: 98.3 ?F (36.8 ?C)   ?Weight: 266 lb (120.7 kg)   ?Height: '5\' 3"'  (1.6 m)   ? ?Body mass index is 47.12 kg/m?.  ?Wt Readings from Last 3 Encounters:  ?04/06/22 266 lb (120.7 kg)  ?03/31/22 266 lb 3.2 oz (120.7 kg)  ?02/17/22 263 lb 6.4 oz (119.5 kg)  ?  ?BP Readings from Last 3 Encounters:  ?04/06/22 140/80  ?03/31/22 (!) 142/78  ?02/17/22 124/80  ? ? ? ?Objective:  ?Physical Exam ?Vitals and nursing note reviewed.  ?Constitutional:   ?  Appearance: Normal appearance. She is obese.  ?HENT:  ?   Head: Normocephalic and atraumatic.  ?Eyes:  ?   Extraocular Movements: Extraocular movements intact.  ?Cardiovascular:  ?   Rate and Rhythm: Normal rate and regular rhythm.  ?   Heart sounds: Normal heart sounds.  ?Pulmonary:  ?   Effort: Pulmonary effort is normal.  ?   Breath sounds: Normal breath sounds.  ?Musculoskeletal:  ?   Cervical back: Normal range of motion.  ?Skin: ?   General: Skin is warm.  ?Neurological:  ?   General: No focal deficit present.  ?   Mental Status: She is alert.  ?Psychiatric:     ?   Mood and Affect: Mood normal.     ?   Behavior: Behavior normal.  ?    ?Assessment And Plan:  ?   ?1. Class 3 severe obesity due to excess calories with serious comorbidity and body mass index (BMI) of 45.0 to 49.9 in adult [facility]) ?Comments: She will c/w  [facility] for now, sample given. Will see if oral semaglutide is covered. She is reminded to stop eating when full. F/u 8-10 weeks.  ? ?2. Indigestion ?Comments: Likely exacerbated by weekly semaglutide. Advised to stop eating 3 hours prior to lying down/going to bed and avoid known triggers. I will also send pepcid rx, advised to take once daily. She will let me know if her sx persist.  ? ?3. Pulmonary hypertension ([place]) ?Comments: Chronic, this has been previously diagnosed. She agrees to referral back to Dr. Einar Gip for further evaluation, repeat echo.  ?- Ambulatory referral to Cardiology ? ?4. Essential hypertension, benign ?Comments: Chronic, uncontrolled. Reminded to follow a low sodium diet and incorporate more exercise into her daily routine.  ?- CMP14+EGFR ?- Hemoglobin A1c ? ?5. COVID-19 vaccination declined ?  ?Patient was given opportunity to ask questions. Patient verbalized understanding of the plan and was able to repeat key elements of the plan. All questions were answered to their satisfaction.  ? ?I, [name], MD, have reviewed all documentation for this visit. The documentation on 04/06/22 for the exam, diagnosis, procedures, and orders are all accurate and complete.  ? ?IF YOU HAVE BEEN REFERRED TO A SPECIALIST, IT MAY TAKE 1-2 WEEKS TO SCHEDULE/PROCESS THE REFERRAL. IF YOU HAVE NOT HEARD FROM US/SPECIALIST IN TWO WEEKS, PLEASE GIVE [place] A CALL AT [phone].  ? ?THE PATIENT IS ENCOURAGED TO PRACTICE SOCIAL DISTANCING DUE TO THE COVID-19 PANDEMIC.   ?

## 2022-04-07 LAB — CMP14+EGFR
ALT: 35 IU/L — ABNORMAL HIGH (ref 0–32)
AST: 20 IU/L (ref 0–40)
Albumin/Globulin Ratio: 1.3 (ref 1.2–2.2)
Albumin: 4.4 g/dL (ref 3.9–5.0)
Alkaline Phosphatase: 79 IU/L (ref 44–121)
BUN/Creatinine Ratio: 7 — ABNORMAL LOW (ref 9–23)
BUN: 6 mg/dL (ref 6–20)
Bilirubin Total: 0.3 mg/dL (ref 0.0–1.2)
CO2: 23 mmol/L (ref 20–29)
Calcium: 9.2 mg/dL (ref 8.7–10.2)
Chloride: 105 mmol/L (ref 96–106)
Creatinine, Ser: 0.9 mg/dL (ref 0.57–1.00)
Globulin, Total: 3.4 g/dL (ref 1.5–4.5)
Glucose: 90 mg/dL (ref 70–99)
Potassium: 4.2 mmol/L (ref 3.5–5.2)
Sodium: 143 mmol/L (ref 134–144)
Total Protein: 7.8 g/dL (ref 6.0–8.5)
eGFR: 88 mL/min/{1.73_m2} (ref >59)

## 2022-04-07 LAB — HEMOGLOBIN A1C
Est. average glucose Bld gHb Est-mCnc: 126 mg/dL
Hgb A1c MFr Bld: 6 % — ABNORMAL HIGH (ref 4.8–5.6)

## 2022-04-15 ENCOUNTER — Encounter: Payer: Self-pay | Admitting: Cardiology

## 2022-04-15 ENCOUNTER — Ambulatory Visit: Payer: 59 | Admitting: Cardiology

## 2022-04-15 VITALS — BP 124/85 | HR 108 | Temp 98.3°F | Resp 17 | Ht 63.0 in | Wt 264.4 lb

## 2022-04-15 DIAGNOSIS — R0609 Other forms of dyspnea: Secondary | ICD-10-CM

## 2022-04-15 DIAGNOSIS — I10 Essential (primary) hypertension: Secondary | ICD-10-CM

## 2022-04-15 NOTE — Progress Notes (Signed)
Primary Physician/Referring:  Dorothyann Peng, MD  [name], female    DOB: 06-Apr-1991, 31 y.o.   MRN: [id number]  Chief Complaint  Patient presents with   New Patient (Initial Visit)   PULM HTN   HPI:    Kimberly Heath  is a 31 y.o. African-American female patient with morbid obesity, hypertension, chronic dyspnea on exertion, hyperglycemia referred to me for evaluation of pulmonary hypertension.  Patient has had an echocardiogram in 2019 which had revealed PA pressure of 30 mmHg.  But in view of dyspnea and obesity, she is now referred for relook and reevaluation.  Except for chronic dyspnea that has remained stable patient states that she is doing well.  Past Medical History:  Diagnosis Date   Anxiety    Back pain    Chest pain    Depression    Edema, lower extremity    Fluid collection (edema) in the arms, legs, hands and feet    Hypertension    Pericardial effusion    Sleep apnea    SOB (shortness of breath)    History reviewed. No pertinent surgical history. Family History  Problem Relation Age of Onset   Healthy Mother    High blood pressure Father    Sickle cell trait Father     Social History   Tobacco Use   Smoking status: Never   Smokeless tobacco: Never  Substance Use Topics   Alcohol use: Yes    Comment: occ   Marital Status: Single  ROS  Review of Systems  Cardiovascular:  Positive for dyspnea on exertion. Negative for chest pain and leg swelling.  Objective  Blood pressure 124/85, pulse (!) 108, temperature 98.3 F (36.8 C), temperature source Temporal, resp. rate 17, height 5\' 3"  (1.6 m), weight 264 lb 6.4 oz (119.9 kg), SpO2 100 %. Body mass index is 46.84 kg/m.     04/15/2022    1:48 PM 04/06/2022    2:20 PM 04/06/2022    2:10 PM  Vitals with BMI  Height 5\' 3"   5\' 3"   Weight 264 lbs 6 oz  266 lbs  BMI 46.85  47.13  Systolic 124 140 06/06/2022  Diastolic 85 80 90  Pulse 108  95    Physical Exam Constitutional:       Appearance: She is obese.  Neck:     Vascular: No JVD.  Cardiovascular:     Rate and Rhythm: Normal rate and regular rhythm.     Pulses: Intact distal pulses.     Heart sounds: Normal heart sounds. No murmur heard.   No gallop.  Pulmonary:     Effort: Pulmonary effort is normal.     Breath sounds: Normal breath sounds.  Abdominal:     General: Bowel sounds are normal.     Palpations: Abdomen is soft.  Musculoskeletal:     Right lower leg: No edema.     Left lower leg: No edema.    Medications and allergies   Allergies  Allergen Reactions   Mushroom Extract Complex Other (See Comments)    Flu like symptoms      Medication list after today's encounter   Current Outpatient Medications:    amLODipine (NORVASC) 10 MG tablet, Take 10 mg by mouth daily., Disp: , Rfl: 0   carvedilol (COREG) 25 MG tablet, Take 25 mg by mouth 2 (two) times daily with a meal., Disp: , Rfl:    famotidine (PEPCID) 20 MG tablet, TAKE 1 TABLET(20  MG) BY MOUTH DAILY, Disp: 90 tablet, Rfl: 1   minocycline (DYNACIN) 50 MG tablet, Take 1 tablet (50 mg total) by mouth 2 (two) times daily., Disp: 60 tablet, Rfl: 0   norethindrone (AYGESTIN) 5 MG tablet, Take 2 tablets by mouth daily. 1 time per day, Disp: , Rfl:    Semaglutide-Weight Management (WEGOVY) 0.5 MG/0.5ML SOAJ, Inject 0.5 mg into the skin once a week., Disp: 2 mL, Rfl: 1   Semaglutide-Weight Management 0.5 MG/0.5ML SOAJ, Inject 0.5 mg into the skin once a week., Disp: , Rfl:   Laboratory examination:   Recent Labs    05/01/21 1250 09/01/21 1252 04/06/22 1445  NA 136 138 143  K 4.1 4.7 4.2  CL 106 102 105  CO2 24 23 23   GLUCOSE 126* 111* 90  BUN 9 6 6   CREATININE 0.90 0.92 0.90  CALCIUM 9.1 9.5 9.2  GFRNONAA >60  --   --    estimated creatinine clearance is 114.6 mL/min (by C-G formula based on SCr of 0.9 mg/dL).     Latest Ref Rng & Units 04/06/2022    2:45 PM 09/01/2021   12:52 PM 05/01/2021   12:50 PM  CMP  Glucose 70 - 99 mg/dL 90    11/01/2021   07/01/2021    BUN 6 - 20 mg/dL 6   6   9     Creatinine 0.57 - 1.00 mg/dL 224   825      Sodium 134 - 144 mmol/L 143   138   136    Potassium 3.5 - 5.2 mmol/L 4.2   4.7   4.1    Chloride 96 - 106 mmol/L 105   102   106    CO2 20 - 29 mmol/L 23   23   24     Calcium 8.7 - 10.2 mg/dL 9.2   9.5   9.1    Total Protein 6.0 - 8.5 g/dL 7.8   8.0   8.4    Total Bilirubin 0.0 - 1.2 mg/dL 0.3   0.6   0.5    Alkaline Phos 44 - 121 IU/L 79   58   59    AST 0 - 40 IU/L 20   17   23     ALT 0 - 32 IU/L 35   21   27        Latest Ref Rng & Units 09/01/2021   12:52 PM 05/01/2021   12:50 PM 05/08/2019   12:00 AM  CBC  WBC 3.4 - 10.8 x10E3/uL 4.2   4.5   5.0    Hemoglobin 11.1 - 15.9 g/dL     11/01/2021    Hematocrit 34.0 - 46.6 % 41.4   39.7   36.9    Platelets 150 - 450 x10E3/uL 194   196       Lipid Panel Recent Labs    09/01/21 1252  CHOL 168  TRIG 47  LDLCALC 108*  HDL 50  CHOLHDL 3.4    HEMOGLOBIN A1C Lab Results  Component Value Date   HGBA1C 6.0 (H) 04/06/2022   TSH Recent Labs    09/01/21 1252  TSH 1.960   Radiology:   Chest x-ray 03/31/2022: The heart size and mediastinal contours are within normal limits. Both lungs are clear. The visualized skeletal structures are unremarkable.  CT angiogram chest 05/01/2021: Cardiovascular: Satisfactory opacification of the pulmonary arteries to the segmental level. No evidence of pulmonary embolism. The heart is mildly enlarged.  No pericardial effusion.  Cardiac Studies:   Treadmill exercise stress test 07/28/2017: Indication chest pain and dyspnea on exertion. Resting EKG demonstrated normal sinus rhythm.  Patient exercised on Bruce protocol for 7 minutes and 45 seconds and achieved 86% of MPHR.  Also achieved 9.76 months of workload. Stress terminated due to fatigue.  Normal blood pressure response.  There was no ST-T wave changes of ischemia.  No significant arrhythmias.  Echocardiogram 12/20/2017: Normal LV size,  moderate LVH, normal wall motion.  Normal diastolic filling pattern.  LVEF calculated at 69%. Mild tricuspid regurgitation.  No evidence of pulm hypertension.  Estimated PASP 30 mmHg. Insignificant pericardial effusion.  Renal artery duplex  12/20/2017: No evidence of renal artery occlusive disease in either renal artery. Normal intrarenal vascular perfusion is noted in both kidneys. Normal abdominal aorta.  EKG:   EKG 04/15/2022: Normal sinus rhythm at rate of 92 bpm, normal axis, no evidence of ischemia, normal EKG.    Assessment     ICD-10-CM   1. Primary hypertension  I10 EKG 12-Lead    PCV ECHOCARDIOGRAM COMPLETE    2. Dyspnea on exertion  R06.09 PCV ECHOCARDIOGRAM COMPLETE    3. Class 3 severe obesity due to excess calories without serious comorbidity with body mass index (BMI) of 45.0 to 49.9 in adult [facility](HCC)  E66.01    Z68.42        Medications Discontinued During This Encounter  Medication Reason   minocycline (MINOCIN) 50 MG capsule Duplicate    No orders of the defined types were placed in this encounter.  Orders Placed This Encounter  Procedures   EKG 12-Lead   PCV ECHOCARDIOGRAM COMPLETE    Standing Status:   Future    Standing Expiration Date:   04/16/2023   Recommendations:   Kimberly GongJessica K Frontera is a 31 y.o. African-American female patient with morbid obesity, hypertension, chronic dyspnea on exertion, hyperglycemia referred to me for evaluation of pulmonary hypertension.  Patient has had an echocardiogram in 2019 which had revealed PA pressure of 30 mmHg.  But in view of dyspnea and obesity, she is now referred for relook and reevaluation.  Patient states that her dyspnea has remained stable, no PND orthopnea or leg edema.  Physical examination except obesity is within normal limits without JVD, hepatomegaly and lung sounds and heart sounds are completely normal.  EKG is normal as well without evidence of right ventricular strain.  I will repeat echocardiogram,  unless abnormal I will see her back on a as needed basis.  She has been set up for sleep study, I encouraged her to keep the appointment.  Weight loss again discussed with the patient.  Patient is presently on Wegovy for weight loss, unfortunately has not had significant loss in weight, lost 9 pounds in the past 3 months and has gained 2 to 3 pounds back.  Exercise program discussed.  No changes in the medications were done, blood pressure is well controlled.    Yates DecampJay Laniesha Das, MD, [facility] 04/15/2022, 2:20 PM Office: [phone]

## 2022-04-22 ENCOUNTER — Ambulatory Visit: Payer: 59

## 2022-04-22 DIAGNOSIS — I10 Essential (primary) hypertension: Secondary | ICD-10-CM

## 2022-04-22 DIAGNOSIS — R0609 Other forms of dyspnea: Secondary | ICD-10-CM

## 2022-04-27 ENCOUNTER — Encounter: Payer: Self-pay | Admitting: Internal Medicine

## 2022-04-27 DIAGNOSIS — R0683 Snoring: Secondary | ICD-10-CM | POA: Insufficient documentation

## 2022-04-27 DIAGNOSIS — R0602 Shortness of breath: Secondary | ICD-10-CM | POA: Insufficient documentation

## 2022-04-27 NOTE — Assessment & Plan Note (Signed)
-   Patient as symptoms of loud snoring, restless sleep, insomnia, dry mouth and morning headache. Epworth 12. Concern patient could have sleep apnea, needs home sleep study to evaluate. Discussed risks of untreated sleep apnea including cardiac arrhthymias, stroke, pulm HTN and DM. Reviewed treatment options. Encourage weight loss and side sleeping position. Advised against driving if experiencing excessive daytime sleepiness. FU in 4-6 weeks or sooner if needed.

## 2022-04-27 NOTE — Assessment & Plan Note (Signed)
-   Patient has has shortness of breath symptoms since having URI 3 months ago. Associated sinus/ chest congestion and cough. No chest tightness or wheezing. Recommend checking CXR and PFTs.

## 2022-05-19 ENCOUNTER — Ambulatory Visit: Payer: 59

## 2022-05-19 DIAGNOSIS — G4733 Obstructive sleep apnea (adult) (pediatric): Secondary | ICD-10-CM | POA: Diagnosis not present

## 2022-05-19 DIAGNOSIS — R0683 Snoring: Secondary | ICD-10-CM

## 2022-05-20 DIAGNOSIS — G4733 Obstructive sleep apnea (adult) (pediatric): Secondary | ICD-10-CM | POA: Diagnosis not present

## 2022-05-25 ENCOUNTER — Encounter: Payer: Self-pay | Admitting: Primary Care

## 2022-05-25 ENCOUNTER — Ambulatory Visit (INDEPENDENT_AMBULATORY_CARE_PROVIDER_SITE_OTHER): Payer: Self-pay | Admitting: Internal Medicine

## 2022-05-25 ENCOUNTER — Ambulatory Visit (INDEPENDENT_AMBULATORY_CARE_PROVIDER_SITE_OTHER): Payer: 59 | Admitting: Primary Care

## 2022-05-25 VITALS — BP 146/78 | HR 93 | Temp 98.3°F | Ht 63.0 in | Wt 268.4 lb

## 2022-05-25 DIAGNOSIS — R0602 Shortness of breath: Secondary | ICD-10-CM

## 2022-05-25 DIAGNOSIS — J984 Other disorders of lung: Secondary | ICD-10-CM | POA: Diagnosis not present

## 2022-05-25 DIAGNOSIS — R062 Wheezing: Secondary | ICD-10-CM | POA: Diagnosis not present

## 2022-05-25 LAB — CBC WITH DIFFERENTIAL/PLATELET
Basophils Absolute: 0 10*3/uL (ref 0.0–0.1)
Basophils Relative: 0.4 % (ref 0.0–3.0)
Eosinophils Absolute: 0 10*3/uL (ref 0.0–0.7)
Eosinophils Relative: 1 % (ref 0.0–5.0)
HCT: 41.7 % (ref 36.0–46.0)
Hemoglobin: 13.9 g/dL (ref 12.0–15.0)
Lymphocytes Relative: 42.8 % (ref 12.0–46.0)
Lymphs Abs: 1.6 10*3/uL (ref 0.7–4.0)
MCHC: 33.2 g/dL (ref 30.0–36.0)
MCV: 79 fl (ref 78.0–100.0)
Monocytes Absolute: 0.4 10*3/uL (ref 0.1–1.0)
Monocytes Relative: 11.5 % (ref 3.0–12.0)
Neutro Abs: 1.6 10*3/uL (ref 1.4–7.7)
Neutrophils Relative %: 44.3 % (ref 43.0–77.0)
Platelets: 212 10*3/uL (ref 150.0–400.0)
RBC: 5.28 Mil/uL — ABNORMAL HIGH (ref 3.87–5.11)
RDW: 17.7 % — ABNORMAL HIGH (ref 11.5–15.5)
WBC: 3.7 10*3/uL — ABNORMAL LOW (ref 4.0–10.5)

## 2022-05-25 LAB — PULMONARY FUNCTION TEST
DL/VA % pred: 131 %
DL/VA: 6.09 ml/min/mmHg/L
DLCO cor % pred: 108 %
DLCO cor: 23.56 ml/min/mmHg
DLCO unc % pred: 108 %
DLCO unc: 23.56 ml/min/mmHg
FEF 25-75 Post: 3.06 L/sec
FEF 25-75 Pre: 1.86 L/sec
FEF2575-%Change-Post: 64 %
FEF2575-%Pred-Post: 89 %
FEF2575-%Pred-Pre: 54 %
FEV1-%Change-Post: 14 %
FEV1-%Pred-Post: 73 %
FEV1-%Pred-Pre: 63 %
FEV1-Post: 2.26 L
FEV1-Pre: 1.97 L
FEV1FVC-%Change-Post: 5 %
FEV1FVC-%Pred-Pre: 95 %
FEV6-%Change-Post: 8 %
FEV6-%Pred-Post: 73 %
FEV6-%Pred-Pre: 67 %
FEV6-Post: 2.66 L
FEV6-Pre: 2.46 L
FEV6FVC-%Pred-Post: 100 %
FEV6FVC-%Pred-Pre: 100 %
FVC-%Change-Post: 8 %
FVC-%Pred-Post: 72 %
FVC-%Pred-Pre: 67 %
FVC-Post: 2.66 L
FVC-Pre: 2.46 L
Post FEV1/FVC ratio: 85 %
Post FEV6/FVC ratio: 100 %
Pre FEV1/FVC ratio: 80 %
Pre FEV6/FVC Ratio: 100 %

## 2022-05-25 LAB — NITRIC OXIDE: Nitric Oxide: 6

## 2022-05-25 MED ORDER — BUDESONIDE-FORMOTEROL FUMARATE 80-4.5 MCG/ACT IN AERO: 2.0000 | INHALATION_SPRAY | Freq: Two times a day (BID) | RESPIRATORY_TRACT | 2 refills | Status: DC

## 2022-05-25 NOTE — Progress Notes (Signed)
Attempted Full PFT Today. Pre/Post Spirometry and DLCO Performed.  

## 2022-05-25 NOTE — Patient Instructions (Signed)
Attempted Full PFT Today. Pre/Post Spirometry and DLCO Performed.  

## 2022-05-25 NOTE — Patient Instructions (Addendum)
Home sleep study results are not yet available  Pulmonary function testing today showed moderate restriction in lung function with positive bronchodilator response.  This is consistent with asthma/weight likely also contributing. Recommend trial inhaled steroid + bronchodilator to help with shortness of breath symptoms.  We will also get lab testing to check allergy markers. FENO was normal.   Recommendations Start Symbicort-2 puffs every morning and evening (rinse mouth after use) Start flonase nasal spray over the counter   Orders: Respiratory allergy panel and CBC with differential  Follow-up 4 to 6 weeks with Kimberly Sandy NP to review response to new medication/inhaler

## 2022-05-25 NOTE — Progress Notes (Signed)
@Patient  ID: , female    DOB: [birth year], 31 y.o.   MRN: [id number]  Chief Complaint  Patient presents with   Follow-up    Had PFT today-sob same, had HST last wk.per pt    Referring provider: [id number], MD  HPI: 31 year old female, never smoked.  Past medical history significant for pulmonary hypertension, obesity, vitamin D deficiency, sickle cell trait.   03/31/2022 Patient presents today for sleep consult. Self referred. Patient reports trouble falling and staying asleep at night. Associated symptoms of snoring, dry mouth and morning headache. She has never had prior sleep study. She was recently started on Wegovy (semaglutide) to help assist with weight loss in March. Typical bedtime is between 12am-2am. It can take her anywhere from 30 mins to several hours to fall asleep. She wakes up 4-5 times a night. She starts her day at either 7:30am or 10am.   She also has issues with breathing. Symptoms have been present for 3 months after an upper respiratory infection. She has associated sinus and chest congestion. She is coughing up mucus. States that her breathing has been more labored. She does not feel it is related to her weight since she has lost weight over the last 6 months. No chest tightness or wheezing.   Sleep questionnaire Symptoms- Insomnia, restless sleep, snoring, dry mouth, morning headache  Prior sleep study- None Bedtime- 12am-2am Time to fall asleep- April to several hours Nocturnal awakenings- 4-5 times a night Out of bed/start of day- 7:30am or 10am Weight changes- down this year Do you operate heavy machinery- No Do you currently wear CPAP- No Do you current wear oxygen- No Epworth- 12   05/25/2022- Interim hx  Patient presents today for 6 to 8-week follow-up with PFTs. Shortness of breath is the same.  Chest x-ray on 03/31/2022 showed clear lungs. Pulmonary function testing today showed moderate restriction with positive bronchodilator  response. Patient had home sleep study last week, results are not yet available. Patient is following with cardiology, last seen on 04/15/2022.  She has had an echocardiogram in the past that showed PA pressure of 04/17/2022.  Repeat echocardiogram on 04/22/2022 showed normal LV systolic function with EF 56% and normal diastolic filling pattern.  Cardiology will see patient back as as-needed basis. Patient is currently on Wegovy for weight loss.    Treadmill exercise stress test 07/28/2017: Indication chest pain and dyspnea on exertion. Resting EKG demonstrated normal sinus rhythm.  Patient exercised on Bruce protocol for 7 minutes and 45 seconds and achieved 86% of MPHR.  Also achieved 9.76 months of workload. Stress terminated due to fatigue.  Normal blood pressure response.  There was no ST-T wave changes of ischemia.  No significant arrhythmias.   Echocardiogram 12/20/2017: Normal LV size, moderate LVH, normal wall motion.  Normal diastolic filling pattern.  LVEF calculated at 69%. Mild tricuspid regurgitation.  No evidence of pulm hypertension.  Estimated PASP 30 mmHg. Insignificant pericardial effusion.  05/25/2022 PFTs>> FVC 2.66 (72%), FEV1 2.26  (73%), ratio 85, DLCO 23.56 (108%) Interpretation: Moderate obstructive airways disease, restriction of exhaled volume, slight BD response  Allergies  Allergen Reactions   Mushroom Extract Complex Other (See Comments)    Flu like symptoms      There is no immunization history on file for this patient.  Past Medical History:  Diagnosis Date   Anxiety    Back pain    Chest pain    Depression    Edema, lower extremity  Fluid collection (edema) in the arms, legs, hands and feet    Hypertension    Pericardial effusion    Sleep apnea    SOB (shortness of breath)     Tobacco History: Social History   Tobacco Use  Smoking Status Never  Smokeless Tobacco Never   Counseling given: Not Answered   Outpatient Medications Prior to Visit   Medication Sig Dispense Refill   amLODipine (NORVASC) 10 MG tablet Take 10 mg by mouth daily.  0   carvedilol (COREG) 25 MG tablet Take 25 mg by mouth 2 (two) times daily with a meal.     famotidine (PEPCID) 20 MG tablet TAKE 1 TABLET(20 MG) BY MOUTH DAILY 90 tablet 1   minocycline (DYNACIN) 50 MG tablet Take 1 tablet (50 mg total) by mouth 2 (two) times daily. 60 tablet 0   norethindrone (AYGESTIN) 5 MG tablet Take 2 tablets by mouth daily. 1 time per day     Semaglutide-Weight Management (WEGOVY) 0.5 MG/0.5ML SOAJ Inject 0.5 mg into the skin once a week. (Patient not taking: Reported on 06/02/2022) 2 mL 1   Semaglutide-Weight Management 0.5 MG/0.5ML SOAJ Inject 0.5 mg into the skin once a week. (Patient not taking: Reported on 05/25/2022)     No facility-administered medications prior to visit.   Review of Systems  Review of Systems  Constitutional: Negative.   HENT: Negative.    Respiratory:  Positive for shortness of breath.   Cardiovascular: Negative.      Physical Exam  BP (!) 146/78 (BP Location: Left Arm, Cuff Size: Large)   Pulse 93   Temp 98.3 F (36.8 C) (Temporal)   Ht 5\' 3"  (1.6 m)   Wt 268 lb 6.4 oz (121.7 kg)   SpO2 100%   BMI 47.54 kg/m  Physical Exam Constitutional:      General: She is not in acute distress.    Appearance: Normal appearance. She is not ill-appearing.  HENT:     Mouth/Throat:     Mouth: Mucous membranes are moist.     Pharynx: Oropharynx is clear.  Cardiovascular:     Rate and Rhythm: Normal rate and regular rhythm.  Pulmonary:     Effort: Pulmonary effort is normal.     Breath sounds: Normal breath sounds.  Musculoskeletal:     Cervical back: Normal range of motion and neck supple.  Skin:    General: Skin is warm and dry.  Neurological:     General: No focal deficit present.     Mental Status: She is alert and oriented to person, place, and time. Mental status is at baseline.  Psychiatric:        Mood and Affect: Mood normal.         Behavior: Behavior normal.        Thought Content: Thought content normal.        Judgment: Judgment normal.      Lab Results:  CBC    Component Value Date/Time   WBC 3.7 (L) 05/25/2022 1240   RBC 5.28 (H) 05/25/2022 1240   HGB 13.9 05/25/2022 1240   HGB 13.5 09/01/2021 1252   HCT 41.7 05/25/2022 1240   HCT 41.4 09/01/2021 1252   PLT 212.0 05/25/2022 1240   PLT 194 09/01/2021 1252   MCV 79.0 05/25/2022 1240   MCV 81 09/01/2021 1252   MCH 26.5 (L) 09/01/2021 1252   MCH 27.1 05/01/2021 1250   MCHC 33.2 05/25/2022 1240   RDW 17.7 (H) 05/25/2022 1240  RDW 15.8 (H) 09/01/2021 1252   LYMPHSABS 1.6 05/25/2022 1240   LYMPHSABS 1.7 05/08/2019 0000   MONOABS 0.4 05/25/2022 1240   EOSABS 0.0 05/25/2022 1240   EOSABS 0.0 05/08/2019 0000   BASOSABS 0.0 05/25/2022 1240   BASOSABS 0.0 05/08/2019 0000    BMET    Component Value Date/Time   NA 143 04/06/2022 1445   K 4.2 04/06/2022 1445   CL 105 04/06/2022 1445   CO2 23 04/06/2022 1445   GLUCOSE 90 04/06/2022 1445   GLUCOSE 126 (H) 05/01/2021 1250   BUN 6 04/06/2022 1445   CREATININE 0.90 04/06/2022 1445   CALCIUM 9.2 04/06/2022 1445   GFRNONAA >60 05/01/2021 1250   GFRAA 117 05/08/2019 0000    BNP    Component Value Date/Time   BNP 11.3 05/01/2021 1250    ProBNP No results found for: "PROBNP"  Imaging: No results found.   Assessment & Plan:   Restrictive lung disease - PFTs on 05/25/22 showed moderate obstructive lung disease, restriction of exhaled volume and positive BD response. FENO and RAST allergy panel were normal. Symptoms and PFTs consistent with asthma and obesity. Trial Symbicort two puff twice daily. Encourage weight loss. FU in 6-8 weeks.    Glenford Bayley, NP 06/22/2022

## 2022-05-27 LAB — RESPIRATORY ALLERGY PROFILE REGION II ~~LOC~~

## 2022-05-27 LAB — INTERPRETATION:

## 2022-06-02 ENCOUNTER — Ambulatory Visit (INDEPENDENT_AMBULATORY_CARE_PROVIDER_SITE_OTHER): Payer: BC Managed Care – PPO | Admitting: Internal Medicine

## 2022-06-02 ENCOUNTER — Encounter: Payer: Self-pay | Admitting: Internal Medicine

## 2022-06-02 VITALS — BP 120/72 | HR 90 | Temp 98.7°F | Ht 63.0 in | Wt 266.0 lb

## 2022-06-02 DIAGNOSIS — Z6841 Body Mass Index (BMI) 40.0 and over, adult: Secondary | ICD-10-CM

## 2022-06-02 DIAGNOSIS — E66813 Obesity, class 3: Secondary | ICD-10-CM

## 2022-06-02 DIAGNOSIS — E88819 Insulin resistance, unspecified: Secondary | ICD-10-CM

## 2022-06-02 DIAGNOSIS — E8881 Metabolic syndrome: Secondary | ICD-10-CM | POA: Diagnosis not present

## 2022-06-02 NOTE — Patient Instructions (Signed)

## 2022-06-02 NOTE — Progress Notes (Signed)
Jeri Cos Llittleton,acting as a Neurosurgeon for [name], MD.,have documented all relevant documentation on the behalf of [name], MD,as directed by  [name], MD while in the presence of [name], MD.  This visit occurred during the SARS-CoV-2 public health emergency.  Safety protocols were in place, including screening questions prior to the visit, additional usage of staff PPE, and extensive cleaning of exam room while observing appropriate contact time as indicated for disinfecting solutions.  Subjective:     [name] , female    DOB: December 02, 1990 , 31 y.o.   MRN: [id number]   Chief Complaint  Patient presents with   Weight Check   Hypertension    HPI  Patient presents today for a weight check. Patient reported she was started on the Rybelsus during her last visit to address insulin resistance. Patient is tolerating the medication without any adverse effects.      Past Medical History:  Diagnosis Date   Anxiety    Back pain    Chest pain    Depression    Edema, lower extremity    Fluid collection (edema) in the arms, legs, hands and feet    Hypertension    Pericardial effusion    Sleep apnea    SOB (shortness of breath)      Family History  Problem Relation Age of Onset   Healthy Mother    High blood pressure Father    Sickle cell trait Father      Current Outpatient Medications:    amLODipine (NORVASC) 10 MG tablet, Take 10 mg by mouth daily., Disp: , Rfl: 0   budesonide-formoterol (SYMBICORT) 80-4.5 MCG/ACT inhaler, Inhale 2 puffs into the lungs in the morning and at bedtime., Disp: 1 each, Rfl: 2   carvedilol (COREG) 25 MG tablet, Take 25 mg by mouth 2 (two) times daily with a meal., Disp: , Rfl:    famotidine (PEPCID) 20 MG tablet, TAKE 1 TABLET(20 MG) BY MOUTH DAILY, Disp: 90 tablet, Rfl: 1   minocycline (DYNACIN) 50 MG tablet, Take 1 tablet (50 mg total) by mouth 2 (two) times daily., Disp: 60 tablet, Rfl: 0    norethindrone (AYGESTIN) 5 MG tablet, Take 2 tablets by mouth daily. 1 time per day, Disp: , Rfl:    Allergies  Allergen Reactions   Mushroom Extract Complex Other (See Comments)    Flu like symptoms      Review of Systems  Constitutional: Negative.   Respiratory: Negative.    Cardiovascular: Negative.   Gastrointestinal: Negative.   Neurological: Negative.   Psychiatric/Behavioral: Negative.       Today's Vitals   06/02/22 1554  BP: 120/72  Pulse: 90  Temp: 98.7 F (37.1 C)  Weight: 266 lb (120.7 kg)  Height: 5\' 3"  (1.6 m)  PainSc: 0-No pain   Body mass index is 47.12 kg/m.  Wt Readings from Last 3 Encounters:  06/02/22 266 lb (120.7 kg)  05/25/22 268 lb 6.4 oz (121.7 kg)  04/15/22 264 lb 6.4 oz (119.9 kg)     Objective:  Physical Exam Vitals and nursing note reviewed.  Constitutional:      Appearance: Normal appearance.  HENT:     Head: Normocephalic and atraumatic.  Eyes:     Extraocular Movements: Extraocular movements intact.  Cardiovascular:     Rate and Rhythm: Normal rate and regular rhythm.     Heart sounds: Normal heart sounds.  Pulmonary:     Effort:  Pulmonary effort is normal.     Breath sounds: Normal breath sounds.  Musculoskeletal:     Cervical back: Normal range of motion.  Skin:    General: Skin is warm.  Neurological:     General: No focal deficit present.     Mental Status: She is alert.  Psychiatric:        Mood and Affect: Mood normal.        Behavior: Behavior normal.      Assessment And Plan:     1. Class 3 severe obesity due to excess calories with serious comorbidity and body mass index (BMI) of 45.0 to 49.9 in adult [facility] Comments: She is encouraged to aim for at least 150 minutes of exercise per week. She will f/u in 8 - 10 weeks.   2. Insulin resistance Comments: She was given samples of Rybelsus 3mg  to take daily. Advised she should also decrease her intake of refined carbs.    Patient was given opportunity to ask  questions. Patient verbalized understanding of the plan and was able to repeat key elements of the plan. All questions were answered to their satisfaction.   I, , MD, have reviewed all documentation for this visit. The documentation on 06/13/22 for the exam, diagnosis, procedures, and orders are all accurate and complete.   IF YOU HAVE BEEN REFERRED TO A SPECIALIST, IT MAY TAKE 1-2 WEEKS TO SCHEDULE/PROCESS THE REFERRAL. IF YOU HAVE NOT HEARD FROM US/SPECIALIST IN TWO WEEKS, PLEASE GIVE 06/15/22 A CALL AT [phone] X [phone].   THE PATIENT IS ENCOURAGED TO PRACTICE SOCIAL DISTANCING DUE TO THE COVID-19 PANDEMIC.

## 2022-06-07 ENCOUNTER — Telehealth: Payer: Self-pay | Admitting: Primary Care

## 2022-06-07 NOTE — Telephone Encounter (Signed)
Pt had HST performed 6/23. [name], please advise on the results. Pt does have an upcoming appt scheduled 7/26.

## 2022-06-07 NOTE — Telephone Encounter (Signed)
Home sleep study showed evidence of severe sleep apnea. Need OV to discuss results and treatment recommendations.

## 2022-06-07 NOTE — Telephone Encounter (Signed)
Irving Burton see above message. Triage disregard

## 2022-06-09 NOTE — Telephone Encounter (Signed)
Attempted to call pt but unable to reach. Left message for her to return call. 

## 2022-06-09 NOTE — Telephone Encounter (Signed)
Called and spoke with pt letting her know the results of HST and she verbalized understanding. Nothing further needed.

## 2022-06-13 ENCOUNTER — Encounter: Payer: Self-pay | Admitting: Internal Medicine

## 2022-06-16 ENCOUNTER — Ambulatory Visit: Payer: 59 | Admitting: Internal Medicine

## 2022-06-22 ENCOUNTER — Ambulatory Visit (INDEPENDENT_AMBULATORY_CARE_PROVIDER_SITE_OTHER): Payer: BC Managed Care – PPO | Admitting: Primary Care

## 2022-06-22 ENCOUNTER — Encounter: Payer: Self-pay | Admitting: Primary Care

## 2022-06-22 VITALS — BP 146/92 | HR 90 | Temp 99.2°F | Ht 63.0 in | Wt 270.8 lb

## 2022-06-22 DIAGNOSIS — G4733 Obstructive sleep apnea (adult) (pediatric): Secondary | ICD-10-CM

## 2022-06-22 DIAGNOSIS — J984 Other disorders of lung: Secondary | ICD-10-CM | POA: Diagnosis not present

## 2022-06-22 DIAGNOSIS — R0683 Snoring: Secondary | ICD-10-CM

## 2022-06-22 DIAGNOSIS — Z6841 Body Mass Index (BMI) 40.0 and over, adult: Secondary | ICD-10-CM

## 2022-06-22 DIAGNOSIS — R0602 Shortness of breath: Secondary | ICD-10-CM

## 2022-06-22 MED ORDER — BUDESONIDE-FORMOTEROL FUMARATE 80-4.5 MCG/ACT IN AERO: 2.0000 | INHALATION_SPRAY | Freq: Two times a day (BID) | RESPIRATORY_TRACT | 2 refills | Status: DC

## 2022-06-22 MED ORDER — ALBUTEROL SULFATE HFA 108 (90 BASE) MCG/ACT IN AERS: 2.0000 | INHALATION_SPRAY | Freq: Four times a day (QID) | RESPIRATORY_TRACT | 2 refills | Status: DC | PRN

## 2022-06-22 MED ORDER — ALBUTEROL SULFATE HFA 108 (90 BASE) MCG/ACT IN AERS: 2.0000 | INHALATION_SPRAY | Freq: Four times a day (QID) | RESPIRATORY_TRACT | 6 refills | Status: DC | PRN

## 2022-06-22 NOTE — Assessment & Plan Note (Signed)
-   Patient has symptoms of snoring, restless sleep and morning HA.  05/20/22 HST>> Severe obstructive sleep apnea, AHI 67.8/hr with SpO2 low 78%.  Reviewed sleep study results.  Recommending patient be started on CPAP due to severity of OSA.  Patient is in agreement with plan.  We will place an order for patient to be started on auto CPAP 5 to 20 cm H2O with mask of choice.  Encourage weight loss efforts.  Advised against driving if experiencing excessive daytime sleepiness or fatigue.  Instructed patient to aim to wear CPAP every night for minimum 4 to 6 hours or longer.  Follow-up 31 to 90 days after CPAP start for compliance check.

## 2022-06-22 NOTE — Progress Notes (Signed)
@Patient  ID: , female    DOB: Jan 22, 1991, 31 y.o.   MRN: [id number]  Chief Complaint  Patient presents with   Follow-up    Pt states her inhaler ran out for a week and half but now she is back taking it.    Referring provider: [id number], MD  HPI: 31 year old female, never smoked.  Past medical history significant for pulmonary hypertension, obesity, vitamin D deficiency, sickle cell trait.   Previous [facility] pulmonary encounters: 03/31/2022 Patient presents today for sleep consult. Self referred. Patient reports trouble falling and staying asleep at night. Associated symptoms of snoring, dry mouth and morning headache. She has never had prior sleep study. She was recently started on Wegovy (semaglutide) to help assist with weight loss in March. Typical bedtime is between 12am-2am. It can take her anywhere from 30 mins to several hours to fall asleep. She wakes up 4-5 times a night. She starts her day at either 7:30am or 10am.   She also has issues with breathing. Symptoms have been present for 3 months after an upper respiratory infection. She has associated sinus and chest congestion. She is coughing up mucus. States that her breathing has been more labored. She does not feel it is related to her weight since she has lost weight over the last 6 months. No chest tightness or wheezing.   Sleep questionnaire Symptoms- Insomnia, restless sleep, snoring, dry mouth, morning headache  Prior sleep study- None Bedtime- 12am-2am Time to fall asleep- April to several hours Nocturnal awakenings- 4-5 times a night Out of bed/start of day- 7:30am or 10am Weight changes- down this year Do you operate heavy machinery- No Do you currently wear CPAP- No Do you current wear oxygen- No Epworth- 12   05/25/2022 Patient presents today for 6 to 8-week follow-up with PFTs. Shortness of breath is the same.  Chest x-ray on 03/31/2022 showed clear lungs. Pulmonary function testing today showed  moderate restriction with positive bronchodilator response. Patient had home sleep study last week, results are not yet available.  Patient is following with cardiology, last seen on 04/15/2022.  She has had an echocardiogram in the past that showed PA pressure of 04/17/2022.  Repeat echocardiogram on 04/22/2022 showed normal LV systolic function with EF 56% and normal diastolic filling pattern.  Cardiology will see patient back as as-needed basis. Patient is currently on Wegovy for weight loss  06/22/2022- interim hx  Patient presents today for 6-8 week follow-up/restrictive lung disease.  Pulmonary function testing showed moderate restriction in lung function with positive bronchodilator response consistent with asthma/obesity.  We started patient on a trial of ICS/LABA to help with shortness of breath symptoms.  Respiratory allergy panel, eosinophil absolute and IgE were normal.  She is doing well today. Symbicort helped with shortness of breath. No longer feels as though she is suffocating. Able to walk up stairs. No wheezing or cough. Sleep study reviewed today which showed severe OSA. Recommending CPAP start, she is in agreement with plan.    Treadmill exercise stress test 07/28/2017: Indication chest pain and dyspnea on exertion. Resting EKG demonstrated normal sinus rhythm.  Patient exercised on Bruce protocol for 7 minutes and 45 seconds and achieved 86% of MPHR.  Also achieved 9.76 months of workload. Stress terminated due to fatigue.  Normal blood pressure response.  There was no ST-T wave changes of ischemia.  No significant arrhythmias.   Echocardiogram 12/20/2017: Normal LV size, moderate LVH, normal wall motion.  Normal diastolic filling pattern.  LVEF calculated at 69%. Mild tricuspid regurgitation.  No evidence of pulm hypertension.  Estimated PASP 30 mmHg. Insignificant pericardial effusion.  Pulmonary function testing:  05/25/2022 PFTs>> FVC 2.66 (72%), FEV1 2.26  (73%), ratio 85, DLCO  23.56 (108%) Interpretation: Moderate obstructive airways disease, restriction of exhaled volume, slight BD response  Sleep study: 05/20/22 HST>> Severe obstructive sleep apnea, AHI 67.8/hr with SpO2 low 78%   Allergies  Allergen Reactions   Mushroom Extract Complex Other (See Comments)    Flu like symptoms      There is no immunization history on file for this patient.  Past Medical History:  Diagnosis Date   Anxiety    Back pain    Chest pain    Depression    Edema, lower extremity    Fluid collection (edema) in the arms, legs, hands and feet    Hypertension    Pericardial effusion    Sleep apnea    SOB (shortness of breath)     Tobacco History: Social History   Tobacco Use  Smoking Status Never   Passive exposure: Never  Smokeless Tobacco Never   Counseling given: Not Answered   Outpatient Medications Prior to Visit  Medication Sig Dispense Refill   amLODipine (NORVASC) 10 MG tablet Take 10 mg by mouth daily.  0   carvedilol (COREG) 25 MG tablet Take 25 mg by mouth 2 (two) times daily with a meal.     famotidine (PEPCID) 20 MG tablet TAKE 1 TABLET(20 MG) BY MOUTH DAILY 90 tablet 1   minocycline (DYNACIN) 50 MG tablet Take 1 tablet (50 mg total) by mouth 2 (two) times daily. 60 tablet 0   norethindrone (AYGESTIN) 5 MG tablet Take 2 tablets by mouth daily. 1 time per day     budesonide-formoterol (SYMBICORT) 80-4.5 MCG/ACT inhaler Inhale 2 puffs into the lungs in the morning and at bedtime. 1 each 2   No facility-administered medications prior to visit.      Review of Systems  Review of Systems   Physical Exam  BP (!) 146/92 (BP Location: Left Arm, Patient Position: Sitting, Cuff Size: Large)   Pulse 90   Temp 99.2 F (37.3 C) (Oral)   Ht 5\' 3"  (1.6 m)   Wt 270 lb 12.8 oz (122.8 kg)   SpO2 100%   BMI 47.97 kg/m  Physical Exam   Lab Results:  CBC    Component Value Date/Time   WBC 3.7 (L) 05/25/2022 1240   RBC 5.28 (H) 05/25/2022 1240    HGB 13.9 05/25/2022 1240   HGB 13.5 09/01/2021 1252   HCT 41.7 05/25/2022 1240   HCT 41.4 09/01/2021 1252   PLT 212.0 05/25/2022 1240   PLT 194 09/01/2021 1252   MCV 79.0 05/25/2022 1240   MCV 81 09/01/2021 1252   MCH 26.5 (L) 09/01/2021 1252   MCH 27.1 05/01/2021 1250   MCHC 33.2 05/25/2022 1240   RDW 17.7 (H) 05/25/2022 1240   RDW 15.8 (H) 09/01/2021 1252   LYMPHSABS 1.6 05/25/2022 1240   LYMPHSABS 1.7 05/08/2019 0000   MONOABS 0.4 05/25/2022 1240   EOSABS 0.0 05/25/2022 1240   EOSABS 0.0 05/08/2019 0000   BASOSABS 0.0 05/25/2022 1240   BASOSABS 0.0 05/08/2019 0000    BMET    Component Value Date/Time   NA 143 04/06/2022 1445   K 4.2 04/06/2022 1445   CL 105 04/06/2022 1445   CO2 23 04/06/2022 1445   GLUCOSE 90 04/06/2022 1445   GLUCOSE 126 (H) 05/01/2021  1250   BUN 6 04/06/2022 1445   CREATININE 0.90 04/06/2022 1445   CALCIUM 9.2 04/06/2022 1445   GFRNONAA >60 05/01/2021 1250   GFRAA 117 05/08/2019 0000    BNP    Component Value Date/Time   BNP 11.3 05/01/2021 1250    ProBNP No results found for: "PROBNP"  Imaging: No results found.   Assessment & Plan:   Restrictive lung disease - Clinical symptoms and PFTs consistent with asthma. FENO and RAST allergy panel were normal. Obesity contributing to restrictive lung disease. Dyspnea symptoms improved on low dose ICS/LABA. Continue Symbicort two puff twice daily. Encourage weight loss efforts.   Severe obstructive sleep apnea - Patient has symptoms of snoring, restless sleep and morning HA.  05/20/22 HST>> Severe obstructive sleep apnea, AHI 67.8/hr with SpO2 low 78%.  Reviewed sleep study results.  Recommending patient be started on CPAP due to severity of OSA.  Patient is in agreement with plan.  We will place an order for patient to be started on auto CPAP 5 to 20 cm H2O with mask of choice.  Encourage weight loss efforts.  Advised against driving if experiencing excessive daytime sleepiness or fatigue.   Instructed patient to aim to wear CPAP every night for minimum 4 to 6 hours or longer.  Follow-up 31 to 90 days after CPAP start for compliance check.   Glenford Bayley, NP 06/22/2022

## 2022-06-22 NOTE — Assessment & Plan Note (Signed)
-   PFTs on 05/25/22 showed moderate obstructive lung disease, restriction of exhaled volume and positive BD response. FENO and RAST allergy panel were normal. Symptoms and PFTs consistent with asthma and obesity. Trial Symbicort two puff twice daily. Encourage weight loss. FU in 6-8 weeks.

## 2022-06-22 NOTE — Patient Instructions (Signed)
Recommendations: Continue Symbicort two puffs morning and evening Once you received CPAP aim to wear every night for minimum 4 to 6 hours or longer  Orders Auto CPAP 5 to 20 cm H2O  Follow-up 31 to 90 days after starting CPAP therapy for compliance check  CPAP and BIPAP Information CPAP and BIPAP are methods that use air pressure to keep your airways open and to help you breathe well. CPAP and BIPAP use different amounts of pressure. Your health care provider will tell you whether CPAP or BIPAP would be more helpful for you. CPAP stands for "continuous positive airway pressure." With CPAP, the amount of pressure stays the same while you breathe in (inhale) and out (exhale). BIPAP stands for "bi-level positive airway pressure." With BIPAP, the amount of pressure will be higher when you inhale and lower when you exhale. This allows you to take larger breaths. CPAP or BIPAP may be used in the hospital, or your health care provider may want you to use it at home. You may need to have a sleep study before your health care provider can order a machine for you to use at home. What are the advantages? CPAP or BIPAP can be helpful if you have: Sleep apnea. Chronic obstructive pulmonary disease (COPD). Heart failure. Medical conditions that cause muscle weakness, including muscular dystrophy or amyotrophic lateral sclerosis (ALS). Other problems that cause breathing to be shallow, weak, abnormal, or difficult. CPAP and BIPAP are most commonly used for obstructive sleep apnea (OSA) to keep the airways from collapsing when the muscles relax during sleep. What are the risks? Generally, this is a safe treatment. However, problems may occur, including: Irritated skin or skin sores if the mask does not fit properly. Dry or stuffy nose or nosebleeds. Dry mouth. Feeling gassy or bloated. Sinus or lung infection if the equipment is not cleaned properly. When should CPAP or BIPAP be used? In most  cases, the mask only needs to be worn during sleep. Generally, the mask needs to be worn throughout the night and during any daytime naps. People with certain medical conditions may also need to wear the mask at other times, such as when they are awake. Follow instructions from your health care provider about when to use the machine. What happens during CPAP or BIPAP?  Both CPAP and BIPAP are provided by a small machine with a flexible plastic tube that attaches to a plastic mask that you wear. Air is blown through the mask into your nose or mouth. The amount of pressure that is used to blow the air can be adjusted on the machine. Your health care provider will set the pressure setting and help you find the best mask for you. Tips for using the mask Because the mask needs to be snug, some people feel trapped or closed-in (claustrophobic) when first using the mask. If you feel this way, you may need to get used to the mask. One way to do this is to hold the mask loosely over your nose or mouth and then gradually apply the mask more snugly. You can also gradually increase the amount of time that you use the mask. Masks are available in various types and sizes. If your mask does not fit well, talk with your health care provider about getting a different one. Some common types of masks include: Full face masks, which fit over the mouth and nose. Nasal masks, which fit over the nose. Nasal pillow or prong masks, which fit into the  nostrils. If you are using a mask that fits over your nose and you tend to breathe through your mouth, a chin strap may be applied to help keep your mouth closed. Use a skin barrier to protect your skin as told by your health care provider. Some CPAP and BIPAP machines have alarms that may sound if the mask comes off or develops a leak. If you have trouble with the mask, it is very important that you talk with your health care provider about finding a way to make the mask easier to  tolerate. Do not stop using the mask. There could be a negative impact on your health if you stop using the mask. Tips for using the machine Place your CPAP or BIPAP machine on a secure table or stand near an electrical outlet. Know where the on/off switch is on the machine. Follow instructions from your health care provider about how to set the pressure on your machine and when you should use it. Do not eat or drink while the CPAP or BIPAP machine is on. Food or fluids could get pushed into your lungs by the pressure of the CPAP or BIPAP. For home use, CPAP and BIPAP machines can be rented or purchased through home health care companies. Many different brands of machines are available. Renting a machine before purchasing may help you find out which particular machine works well for you. Your health insurance company may also decide which machine you may get. Keep the CPAP or BIPAP machine and attachments clean. Ask your health care provider for specific instructions. Check the humidifier if you have a dry stuffy nose or nosebleeds. Make sure it is working correctly. Follow these instructions at home: Take over-the-counter and prescription medicines only as told by your health care provider. Ask if you can take sinus medicine if your sinuses are blocked. Do not use any products that contain nicotine or tobacco. These products include cigarettes, chewing tobacco, and vaping devices, such as e-cigarettes. If you need help quitting, ask your health care provider. Keep all follow-up visits. This is important. Contact a health care provider if: You have redness or pressure sores on your head, face, mouth, or nose from the mask or head gear. You have trouble using the CPAP or BIPAP machine. You cannot tolerate wearing the CPAP or BIPAP mask. Someone tells you that you snore even when wearing your CPAP or BIPAP. Get help right away if: You have trouble breathing. You feel confused. Summary CPAP and  BIPAP are methods that use air pressure to keep your airways open and to help you breathe well. If you have trouble with the mask, it is very important that you talk with your health care provider about finding a way to make the mask easier to tolerate. Do not stop using the mask. There could be a negative impact to your health if you stop using the mask. Follow instructions from your health care provider about when to use the machine. This information is not intended to replace advice given to you by your health care provider. Make sure you discuss any questions you have with your health care provider. Document Revised: 06/23/2021 Document Reviewed: 10/23/2020 Elsevier Patient Education  2023 [employer].

## 2022-06-22 NOTE — Assessment & Plan Note (Deleted)
-   Continue BSJGGE for weight loss

## 2022-06-22 NOTE — Assessment & Plan Note (Signed)
-   Clinical symptoms and PFTs consistent with asthma. FENO and RAST allergy panel were normal. Obesity contributing to restrictive lung disease. Dyspnea symptoms improved on low dose ICS/LABA. Continue Symbicort two puff twice daily. Encourage weight loss efforts.

## 2022-06-30 NOTE — Progress Notes (Signed)
Reviewed and agree with assessment/plan.   Sarita Hakanson, MD [facility] 06/30/2022, 9:49 AM Pager:  [phone]  

## 2022-07-06 ENCOUNTER — Encounter (INDEPENDENT_AMBULATORY_CARE_PROVIDER_SITE_OTHER): Payer: Self-pay

## 2022-07-27 ENCOUNTER — Encounter: Payer: Self-pay | Admitting: Internal Medicine

## 2022-07-27 ENCOUNTER — Ambulatory Visit (INDEPENDENT_AMBULATORY_CARE_PROVIDER_SITE_OTHER): Payer: BC Managed Care – PPO | Admitting: Internal Medicine

## 2022-07-27 VITALS — BP 124/88 | Temp 98.1°F | Ht 63.0 in | Wt 273.4 lb

## 2022-07-27 DIAGNOSIS — H6123 Impacted cerumen, bilateral: Secondary | ICD-10-CM

## 2022-07-27 DIAGNOSIS — L708 Other acne: Secondary | ICD-10-CM

## 2022-07-27 DIAGNOSIS — J029 Acute pharyngitis, unspecified: Secondary | ICD-10-CM | POA: Diagnosis not present

## 2022-07-27 DIAGNOSIS — I10 Essential (primary) hypertension: Secondary | ICD-10-CM | POA: Diagnosis not present

## 2022-07-27 DIAGNOSIS — Z0000 Encounter for general adult medical examination without abnormal findings: Secondary | ICD-10-CM

## 2022-07-27 DIAGNOSIS — K061 Gingival enlargement: Secondary | ICD-10-CM

## 2022-07-27 DIAGNOSIS — D573 Sickle-cell trait: Secondary | ICD-10-CM

## 2022-07-27 DIAGNOSIS — E8881 Metabolic syndrome: Secondary | ICD-10-CM

## 2022-07-27 DIAGNOSIS — J4520 Mild intermittent asthma, uncomplicated: Secondary | ICD-10-CM

## 2022-07-27 DIAGNOSIS — Z6841 Body Mass Index (BMI) 40.0 and over, adult: Secondary | ICD-10-CM

## 2022-07-27 DIAGNOSIS — I272 Pulmonary hypertension, unspecified: Secondary | ICD-10-CM

## 2022-07-27 LAB — POC COVID19 BINAXNOW: SARS Coronavirus 2 Ag: NEGATIVE

## 2022-07-27 MED ORDER — CARVEDILOL 25 MG PO TABS: 25.0000 mg | ORAL_TABLET | Freq: Two times a day (BID) | ORAL | 1 refills | Status: DC

## 2022-07-27 MED ORDER — ALBUTEROL SULFATE HFA 108 (90 BASE) MCG/ACT IN AERS: 2.0000 | INHALATION_SPRAY | Freq: Four times a day (QID) | RESPIRATORY_TRACT | 2 refills | Status: DC | PRN

## 2022-07-27 MED ORDER — MINOCYCLINE HCL 50 MG PO TABS: 50.0000 mg | ORAL_TABLET | Freq: Two times a day (BID) | ORAL | 0 refills | Status: DC

## 2022-07-27 MED ORDER — NORETHINDRONE ACETATE 5 MG PO TABS: 10.0000 mg | ORAL_TABLET | Freq: Every day | ORAL | 0 refills | Status: DC

## 2022-07-27 MED ORDER — MONTELUKAST SODIUM 10 MG PO TABS: 10.0000 mg | ORAL_TABLET | Freq: Every day | ORAL | 2 refills | Status: DC

## 2022-07-27 NOTE — Progress Notes (Signed)
I,Kimberly Heath,acting as a scribe for Kimberly Greenland, MD.,have documented all relevant documentation on the behalf of Kimberly Greenland, MD,as directed by  Kimberly Greenland, MD while in the presence of Kimberly Greenland, MD.   Subjective:     [name] , female    DOB: 12/26/1990 , 31 y.o.   MRN: [id number]   Chief Complaint  Patient presents with   Annual Exam   Hypertension    HPI  Patient presents today for HM. She is followed by GYN for her pelvic exams. She reports compliance with meds. However, does report stopping amlodipine. Advised by her dentist that this contributes to overgrowth of her gums.   EKG completed May 19th 2023.  Hypertension This is a chronic problem. The current episode started more than 1 year ago. The problem has been gradually improving since onset. The problem is controlled. Pertinent negatives include no blurred vision, chest pain, headaches or neck pain. Risk factors for coronary artery disease include obesity. The current treatment provides moderate improvement.     Past Medical History:  Diagnosis Date   Anxiety    Back pain    Chest pain    Depression    Edema, lower extremity    Fluid collection (edema) in the arms, legs, hands and feet    Hypertension    Pericardial effusion    Sleep apnea    SOB (shortness of breath)      Family History  Problem Relation Age of Onset   Healthy Mother    High blood pressure Father    Sickle cell trait Father      Current Outpatient Medications:    budesonide-formoterol (SYMBICORT) 80-4.5 MCG/ACT inhaler, Inhale 2 puffs into the lungs in the morning and at bedtime., Disp: 1 each, Rfl: 2   famotidine (PEPCID) 20 MG tablet, TAKE 1 TABLET(20 MG) BY MOUTH DAILY, Disp: 90 tablet, Rfl: 1   montelukast (SINGULAIR) 10 MG tablet, Take 1 tablet (10 mg total) by mouth daily., Disp: 90 tablet, Rfl: 2   albuterol (VENTOLIN HFA) 108 (90 Base) MCG/ACT inhaler, Inhale 2 puffs into the lungs  every 6 (six) hours as needed for wheezing or shortness of breath., Disp: 54 g, Rfl: 2   carvedilol (COREG) 25 MG tablet, Take 1 tablet (25 mg total) by mouth 2 (two) times daily with a meal., Disp: 180 tablet, Rfl: 1   minocycline (DYNACIN) 50 MG tablet, Take 1 tablet (50 mg total) by mouth 2 (two) times daily., Disp: 180 tablet, Rfl: 0   norethindrone (AYGESTIN) 5 MG tablet, Take 2 tablets (10 mg total) by mouth daily. 1 time per day, Disp: 180 tablet, Rfl: 0   Allergies  Allergen Reactions   Mushroom Extract Complex Other (See Comments)    Flu like symptoms       The patient states she uses oral progesterone-only contraceptive for birth control. Last LMP was No LMP recorded. (Menstrual status: Oral contraceptives).. Negative for Dysmenorrhea. Negative for: breast discharge, breast lump(s), breast pain and breast self exam. Associated symptoms include abnormal vaginal bleeding. Pertinent negatives include abnormal bleeding (hematology), anxiety, decreased libido, depression, difficulty falling sleep, dyspareunia, history of infertility, nocturia, sexual dysfunction, sleep disturbances, urinary incontinence, urinary urgency, vaginal discharge and vaginal itching. Diet regular.The patient states her exercise level is    . The patient's tobacco use is:  Social History   Tobacco Use  Smoking Status Never   Passive exposure: Never  Smokeless Tobacco Never  .  She has been exposed to passive smoke. The patient's alcohol use is:  Social History   Substance and Sexual Activity  Alcohol Use Yes   Comment: occ   Review of Systems  Constitutional: Negative.   HENT:  Positive for sore throat.   Eyes: Negative.  Negative for blurred vision.  Respiratory: Negative.    Cardiovascular: Negative.  Negative for chest pain.  Gastrointestinal: Negative.   Endocrine: Negative.   Genitourinary: Negative.   Musculoskeletal: Negative.  Negative for neck pain.  Skin: Negative.   Allergic/Immunologic:  Negative.   Neurological: Negative.  Negative for headaches.  Hematological: Negative.   Psychiatric/Behavioral: Negative.       Today's Vitals   07/27/22 1114  BP: 124/88  Temp: 98.1 F (36.7 C)  SpO2: 98%  Weight: 273 lb 6.4 oz (124 kg)  Height: '5\' 3"'  (1.6 m)  PainSc: 0-No pain   Body mass index is 48.43 kg/m.  Wt Readings from Last 3 Encounters:  07/27/22 273 lb 6.4 oz (124 kg)  06/22/22 270 lb 12.8 oz (122.8 kg)  06/02/22 266 lb (120.7 kg)    Objective:  Physical Exam Vitals and nursing note reviewed.  Constitutional:      Appearance: Normal appearance.  HENT:     Head: Normocephalic and atraumatic.     Comments: Gingival hyperplasia is present    Right Ear: Ear canal and external ear normal. There is impacted cerumen.     Left Ear: Ear canal and external ear normal. There is impacted cerumen.     Nose: Nose normal.     Mouth/Throat:     Mouth: Mucous membranes are moist.     Pharynx: Oropharynx is clear.  Eyes:     Extraocular Movements: Extraocular movements intact.     Conjunctiva/sclera: Conjunctivae normal.     Pupils: Pupils are equal, round, and reactive to light.  Cardiovascular:     Rate and Rhythm: Normal rate and regular rhythm.     Pulses: Normal pulses.     Heart sounds: Normal heart sounds.  Pulmonary:     Effort: Pulmonary effort is normal.     Breath sounds: Normal breath sounds.  Chest:  Breasts:    Tanner Score is 5.     Right: Normal.     Left: Normal.  Abdominal:     General: Bowel sounds are normal.     Palpations: Abdomen is soft.     Comments: Soft, obese  Genitourinary:    Comments: deferred Musculoskeletal:        General: Normal range of motion.     Cervical back: Normal range of motion and neck supple.  Skin:    General: Skin is warm and dry.  Neurological:     General: No focal deficit present.     Mental Status: She is alert and oriented to person, place, and time.  Psychiatric:        Mood and Affect: Mood normal.         Behavior: Behavior normal.         Assessment And Plan:     1. Encounter for annual health examination Comments: A full exam was performed. Importance of monthly self breast exams was discussed with the patient. PATIENT IS ADVISED TO GET 30-45 MINUTES REGULAR EXERCISE NO LESS THAN FOUR TO FIVE DAYS PER WEEK - BOTH WEIGHTBEARING EXERCISES AND AEROBIC ARE RECOMMENDED.  PATIENT IS ADVISED TO FOLLOW A HEALTHY DIET WITH AT LEAST SIX FRUITS/VEGGIES PER DAY, DECREASE INTAKE OF RED  MEAT, AND TO INCREASE FISH INTAKE TO TWO DAYS PER WEEK.  MEATS/FISH SHOULD NOT BE FRIED, BAKED OR BROILED IS PREFERABLE.  IT IS ALSO IMPORTANT TO CUT BACK ON YOUR SUGAR INTAKE. PLEASE AVOID ANYTHING WITH ADDED SUGAR, CORN SYRUP OR OTHER SWEETENERS. IF YOU MUST USE A SWEETENER, YOU CAN TRY STEVIA. IT IS ALSO IMPORTANT TO AVOID ARTIFICIALLY SWEETENERS AND DIET BEVERAGES. LASTLY, I SUGGEST WEARING SPF 50 SUNSCREEN ON EXPOSED PARTS AND ESPECIALLY WHEN IN THE DIRECT SUNLIGHT FOR AN EXTENDED PERIOD OF TIME.  PLEASE AVOID FAST FOOD RESTAURANTS AND INCREASE YOUR WATER INTAKE. - CMP14+EGFR - CBC - Hemoglobin A1c - Lipid panel - norethindrone (AYGESTIN) 5 MG tablet; Take 2 tablets (10 mg total) by mouth daily. 1 time per day  Dispense: 180 tablet; Refill: 0  2. Essential hypertension, benign Comments: Chronic, fair control. Diastolic elevation is present. Amlodipine removed from med list. Encouraged to incorporate more exercise into her daily routine. She will c/w carvedilol 31m twice daily. Cardiology note reviewed, input appreciated. She will f/u in 4-6 months. Goal BP<130/80.  - POCT Urinalysis Dipstick (81002) - Microalbumin / creatinine urine ratio - carvedilol (COREG) 25 MG tablet; Take 1 tablet (25 mg total) by mouth 2 (two) times daily with a meal.  Dispense: 180 tablet; Refill: 1  3. Gingival hyperplasia Comments: Amlodipine stopped, I was unaware of this association. She will continue under the care of her dentist and  periodontist.   4. Bilateral impacted cerumen Comments: After verbal consent, both ears were flushed by irrigation without complications. No TM abnormalities were noted.  - Ear Lavage  5. Mild intermittent asthma without complication Comments: Chronic, symptoms stable on Symbicort 878m 2 puffs twice daily. Pulmonary input appreciated.  - albuterol (VENTOLIN HFA) 108 (90 Base) MCG/ACT inhaler; Inhale 2 puffs into the lungs every 6 (six) hours as needed for wheezing or shortness of breath.  Dispense: 54 g; Refill: 2  6. Sickle cell trait (HCC) Comments: Chronic, stable. Encouraged to stay well hydrated.   7. Other acne Comments: Rx minocycline refilled at request of the patient. She is also followed by Derm.   8. Sore throat Comments: Rapid COVID test neg, will send PCR. She agrees w/ treatment plan. Sx likely related to PND, may benefit from Zyrtec nightly.  - Novel Coronavirus, NAA ([facility]) - POC COVID-19 BinaxNow  9. Class 3 severe obesity due to excess calories with serious comorbidity and body mass index (BMI) of 45.0 to 49.9 in adult [facility]: She is again encouraged to aim for at least 150 minutes of exercise per week, initially striving for BMI<40 to decrease cardiac risk.   Patient was given opportunity to ask questions. Patient verbalized understanding of the plan and was able to repeat key elements of the plan. All questions were answered to their satisfaction.   I, RoMaximino GreenlandMD, have reviewed all documentation for this visit. The documentation on 07/27/22 for the exam, diagnosis, procedures, and orders are all accurate and complete.   THE PATIENT IS ENCOURAGED TO PRACTICE SOCIAL DISTANCING DUE TO THE COVID-19 PANDEMIC.

## 2022-07-27 NOTE — Patient Instructions (Signed)

## 2022-07-28 LAB — CMP14+EGFR
ALT: 26 IU/L (ref 0–32)
AST: 18 IU/L (ref 0–40)
Albumin/Globulin Ratio: 1.3 (ref 1.2–2.2)
Albumin: 4.7 g/dL (ref 4.0–5.0)
Alkaline Phosphatase: 63 IU/L (ref 44–121)
BUN/Creatinine Ratio: 6 — ABNORMAL LOW (ref 9–23)
BUN: 5 mg/dL — ABNORMAL LOW (ref 6–20)
Bilirubin Total: 0.4 mg/dL (ref 0.0–1.2)
CO2: 22 mmol/L (ref 20–29)
Calcium: 9.1 mg/dL (ref 8.7–10.2)
Chloride: 103 mmol/L (ref 96–106)
Creatinine, Ser: 0.86 mg/dL (ref 0.57–1.00)
Globulin, Total: 3.6 g/dL (ref 1.5–4.5)
Glucose: 119 mg/dL — ABNORMAL HIGH (ref 70–99)
Potassium: 4.3 mmol/L (ref 3.5–5.2)
Sodium: 140 mmol/L (ref 134–144)
Total Protein: 8.3 g/dL (ref 6.0–8.5)
eGFR: 93 mL/min/{1.73_m2} (ref >59)

## 2022-07-28 LAB — CBC
Hematocrit: 42.2 % (ref 34.0–46.6)
Hemoglobin: 13.4 g/dL (ref 11.1–15.9)
MCH: 25.8 pg — ABNORMAL LOW (ref 26.6–33.0)
MCHC: 31.8 g/dL (ref 31.5–35.7)
MCV: 81 fL (ref 79–97)
Platelets: 206 10*3/uL (ref 150–450)
RBC: 5.2 x10E6/uL (ref 3.77–5.28)
RDW: 17.6 % — ABNORMAL HIGH (ref 11.7–15.4)
WBC: 3.9 10*3/uL (ref 3.4–10.8)

## 2022-07-28 LAB — LIPID PANEL
Chol/HDL Ratio: 3.5 ratio (ref 0.0–4.4)
Cholesterol, Total: 170 mg/dL (ref 100–199)
HDL: 48 mg/dL (ref >39)
LDL Chol Calc (NIH): 114 mg/dL — ABNORMAL HIGH (ref 0–99)
Triglycerides: 39 mg/dL (ref 0–149)
VLDL Cholesterol Cal: 8 mg/dL (ref 5–40)

## 2022-07-28 LAB — NOVEL CORONAVIRUS, NAA: SARS-CoV-2, NAA: NOT DETECTED

## 2022-07-28 LAB — HEMOGLOBIN A1C
Est. average glucose Bld gHb Est-mCnc: 148 mg/dL
Hgb A1c MFr Bld: 6.8 % — ABNORMAL HIGH (ref 4.8–5.6)

## 2022-07-29 LAB — MICROALBUMIN / CREATININE URINE RATIO
Creatinine, Urine: 95.2 mg/dL
Microalb/Creat Ratio: 126 mg/g creat — ABNORMAL HIGH (ref 0–29)
Microalbumin, Urine: 119.7 ug/mL

## 2022-07-29 LAB — SPECIMEN STATUS REPORT

## 2022-07-31 ENCOUNTER — Other Ambulatory Visit: Payer: Self-pay | Admitting: Internal Medicine

## 2022-07-31 DIAGNOSIS — L708 Other acne: Secondary | ICD-10-CM

## 2022-07-31 MED ORDER — MINOCYCLINE HCL 50 MG PO TABS: 50.0000 mg | ORAL_TABLET | Freq: Two times a day (BID) | ORAL | 0 refills | Status: DC

## 2022-08-01 ENCOUNTER — Encounter: Payer: Self-pay | Admitting: Internal Medicine

## 2022-08-16 ENCOUNTER — Other Ambulatory Visit: Payer: Self-pay

## 2022-08-16 DIAGNOSIS — R809 Proteinuria, unspecified: Secondary | ICD-10-CM

## 2022-08-16 MED ORDER — SEMAGLUTIDE (1 MG/DOSE) 4 MG/3ML ~~LOC~~ SOPN: 0.5000 mg | PEN_INJECTOR | SUBCUTANEOUS | 0 refills | Status: DC

## 2022-08-17 ENCOUNTER — Other Ambulatory Visit: Payer: Self-pay

## 2022-08-17 MED ORDER — SEMAGLUTIDE (1 MG/DOSE) 4 MG/3ML ~~LOC~~ SOPN: 1.0000 mg | PEN_INJECTOR | SUBCUTANEOUS | 0 refills | Status: DC

## 2022-09-21 ENCOUNTER — Other Ambulatory Visit (HOSPITAL_COMMUNITY): Payer: Self-pay

## 2022-09-21 ENCOUNTER — Other Ambulatory Visit: Payer: Self-pay

## 2022-09-21 MED ORDER — SEMAGLUTIDE (1 MG/DOSE) 4 MG/3ML ~~LOC~~ SOPN
1.0000 mg | PEN_INJECTOR | SUBCUTANEOUS | 0 refills | Status: DC
  Filled 2022-09-21 – 2022-09-28 (×2): qty 9, 84d supply, fill #0

## 2022-09-28 ENCOUNTER — Other Ambulatory Visit (HOSPITAL_COMMUNITY): Payer: Self-pay

## 2022-10-04 ENCOUNTER — Ambulatory Visit: Payer: Self-pay | Admitting: Internal Medicine

## 2022-10-09 ENCOUNTER — Other Ambulatory Visit: Payer: Self-pay | Admitting: Internal Medicine

## 2022-10-19 ENCOUNTER — Telehealth: Payer: Self-pay | Admitting: Internal Medicine

## 2022-10-25 ENCOUNTER — Telehealth (INDEPENDENT_AMBULATORY_CARE_PROVIDER_SITE_OTHER): Payer: BC Managed Care – PPO | Admitting: Internal Medicine

## 2022-10-25 ENCOUNTER — Encounter: Payer: Self-pay | Admitting: Internal Medicine

## 2022-10-25 DIAGNOSIS — L659 Nonscarring hair loss, unspecified: Secondary | ICD-10-CM | POA: Diagnosis not present

## 2022-10-25 DIAGNOSIS — G44219 Episodic tension-type headache, not intractable: Secondary | ICD-10-CM | POA: Diagnosis not present

## 2022-10-25 DIAGNOSIS — E1169 Type 2 diabetes mellitus with other specified complication: Secondary | ICD-10-CM | POA: Diagnosis not present

## 2022-10-25 DIAGNOSIS — L210 Seborrhea capitis: Secondary | ICD-10-CM | POA: Diagnosis not present

## 2022-10-25 DIAGNOSIS — E669 Obesity, unspecified: Secondary | ICD-10-CM

## 2022-10-25 DIAGNOSIS — Z6841 Body Mass Index (BMI) 40.0 and over, adult: Secondary | ICD-10-CM

## 2022-10-25 NOTE — Patient Instructions (Signed)
Seborrheic Dermatitis, Adult Seborrheic dermatitis is a skin disease that causes red, scaly patches. It often occurs on the scalp, where it may be called dandruff. The patches may also appear on other parts of the body. Skin patches tend to occur where there are a lot of oil glands in the skin. Areas of the body that may be affected include: The scalp. The face, eyebrows, and ears. The area around a beard. Skin folds of the body. This includes the armpits, groin, and buttocks. The chest. The condition is often long-lasting (chronic). It may come and go for no known reason. It may be activated by a trigger, such as: Cold weather. Being out in the sun. Stress. Drinking alcohol. What are the causes? The cause of this condition is not known. It may be related to having too much yeast on the skin or changes in how your body's disease-fighting system (immune system) works. What increases the risk? You may be more likely to develop this condition if: You have a weak immune system. You are 50 years old or older. You have other conditions, such as: Human immunodeficiency virus (HIV) or acquired immunodeficiency virus (AIDS). Parkinson's disease. Mood disorders, such as depression. Liver problems. Obesity. What are the signs or symptoms? Symptoms of this condition include: Thick scales on the scalp. Redness on the face or in the armpits. Skin that is flaky. The flakes may be white or yellow. Skin that seems oily or dry but is not helped with moisturizers. Itching or burning in the affected areas. How is this diagnosed? This condition is diagnosed with a medical history and physical exam. A sample of your skin may be tested (skin biopsy). You may need to see a skin specialist (dermatologist). How is this treated? There is no cure for this condition, but treatment can help to manage the symptoms. You may get treatment to remove scales, lower the risk of skin infection, and reduce swelling or  itching. Treatment may include: Medicated shampoos, moisturizing creams, or ointments. Creams that reduce skin yeast. Creams that reduce swelling and irritation (steroids). Follow these instructions at home: Skin care Use any medicated shampoo, skin creams, or ointments only as told by your health care provider. Do not use skin products that contain alcohol. Take lukewarm baths or showers. Avoid very hot water. When you are outside, wear a hat and clothes that block UV light. General instructions Apply over-the-counter and prescription medicines only as told by your health care provider. Learn what triggers your symptoms so you can avoid these things. Use techniques for stress reduction, such as meditation or yoga. Do not drink alcohol if your health care provider tells you not to drink. Keep all follow-up visits. Your health care provider will check your skin to make sure the treatments are helping. Where to find more information American Academy of Dermatology: aad.org Contact a health care provider if: Your symptoms do not get better with treatment. Your symptoms get worse. You have new symptoms. Get help right away if: Your condition quickly gets worse, even with treatment. This information is not intended to replace advice given to you by your health care provider. Make sure you discuss any questions you have with your health care provider. Document Revised: 04/15/2022 Document Reviewed: 04/15/2022 Elsevier Patient Education  2023 Elsevier Inc.  

## 2022-10-25 NOTE — Progress Notes (Signed)
Virtual Visit via Video   This visit type was conducted due to national recommendations for restrictions regarding the COVID-19 Pandemic (e.g. social distancing) in an effort to limit this patient's exposure and mitigate transmission in our community.  Due to her co-morbid illnesses, this patient is at least at moderate risk for complications without adequate follow up.  This format is felt to be most appropriate for this patient at this time.  All issues noted in this document were discussed and addressed.  A limited physical exam was performed with this format.    This visit type was conducted due to national recommendations for restrictions regarding the COVID-19 Pandemic (e.g. social distancing) in an effort to limit this patient's exposure and mitigate transmission in our community.  Patients identity confirmed using two different identifiers.  This format is felt to be most appropriate for this patient at this time.  All issues noted in this document were discussed and addressed.  No physical exam was performed (except for noted visual exam findings with Video Visits).    Date:  10/25/2022   ID:  Kimberly THORNSBERRY, DOB December 24, 1990, MRN [id number]  Patient Location:  Home  Provider location:   Office    Chief Complaint:  "I have hair loss"  History of Present Illness:    Kimberly Heath is a 31 y.o. female who presents via video conferencing for a telehealth visit today.    The patient does not have symptoms concerning for COVID-19 infection (fever, chills, cough, or new shortness of breath).   She presents today for virtual visit. She prefers this method of contact due to COVID-19 pandemic.  She presents for a virtual visit today because she needs an referral to dermatology. Patient states she's been having bad dandruff and now is experiencing hair loss she first noticed in February. She is not sure what has contributed to her sx. She denies dietary changes. She has known h/o  dandruff, but feels like her condition has worsened.      Past Medical History:  Diagnosis Date   Anxiety    Back pain    Chest pain    Depression    Edema, lower extremity    Fluid collection (edema) in the arms, legs, hands and feet    Hypertension    Pericardial effusion    Sleep apnea    SOB (shortness of breath)    History reviewed. No pertinent surgical history.   Current Meds  Medication Sig   albuterol (VENTOLIN HFA) 108 (90 Base) MCG/ACT inhaler Inhale 2 puffs into the lungs every 6 (six) hours as needed for wheezing or shortness of breath.   budesonide-formoterol (SYMBICORT) 80-4.5 MCG/ACT inhaler Inhale 2 puffs into the lungs in the morning and at bedtime.   carvedilol (COREG) 25 MG tablet Take 1 tablet (25 mg total) by mouth 2 (two) times daily with a meal.   famotidine (PEPCID) 20 MG tablet TAKE 1 TABLET(20 MG) BY MOUTH DAILY   minocycline (DYNACIN) 50 MG tablet Take 1 tablet (50 mg total) by mouth 2 (two) times daily.   montelukast (SINGULAIR) 10 MG tablet Take 1 tablet (10 mg total) by mouth daily.   norethindrone (AYGESTIN) 5 MG tablet Take 2 tablets (10 mg total) by mouth daily. 1 time per day     Allergies:   Mushroom extract complex   Social History   Tobacco Use   Smoking status: Never    Passive exposure: Never   Smokeless tobacco: Never  Vaping  Use   Vaping Use: Never used  Substance Use Topics   Alcohol use: Yes    Comment: occ   Drug use: No     Family Hx: The patient's family history includes Healthy in her mother; High blood pressure in her father; Sickle cell trait in her father.  ROS:   Please see the history of present illness.    Review of Systems  Constitutional: Negative.   Respiratory: Negative.    Cardiovascular: Negative.  Orthopnea: hair loss.  Gastrointestinal:  Positive for nausea.       She c/o nausea. Occurs after eating in the mornings. Started two weeks ago. She denies having any abdominal pain.   Neurological:   Positive for headaches.  Psychiatric/Behavioral: Negative.      All other systems reviewed and are negative.   Labs/Other Tests and Data Reviewed:    Recent Labs: 07/27/2022: ALT 26; BUN 5; Creatinine, Ser 0.86; Hemoglobin 13.4; Platelets 206; Potassium 4.3; Sodium 140   Recent Lipid Panel Lab Results  Component Value Date/Time   CHOL 170 07/27/2022 12:17 PM   TRIG 39 07/27/2022 12:17 PM   HDL 48 07/27/2022 12:17 PM   CHOLHDL 3.5 07/27/2022 12:17 PM   LDLCALC 114 (H) 07/27/2022 12:17 PM    Wt Readings from Last 3 Encounters:  07/27/22 273 lb 6.4 oz (124 kg)  06/22/22 270 lb 12.8 oz (122.8 kg)  06/02/22 266 lb (120.7 kg)     Exam:    Vital Signs:  There were no vitals taken for this visit.    Physical Exam Vitals and nursing note reviewed.  Constitutional:      Appearance: Normal appearance.  HENT:     Head: Normocephalic and atraumatic.     Nose:     Comments: Masked     Mouth/Throat:     Comments: Masked  Eyes:     Extraocular Movements: Extraocular movements intact.  Pulmonary:     Effort: Pulmonary effort is normal.  Musculoskeletal:     Cervical back: Normal range of motion.  Neurological:     Mental Status: She is alert and oriented to person, place, and time.  Psychiatric:        Mood and Affect: Affect normal.     ASSESSMENT & PLAN:    1. 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 Advised to stay well hydrated and increase intake of healthy fats. Decrease gluten and sugar intake. Agrees to Derm referral. - Ambulatory referral to Dermatology  2. Hair loss Comments: Agrees to Derm Referral. Will come in on Thursday for labs as below. I will make further recommendations once her labs are available for review.  - Ambulatory referral to Dermatology - TSH; Future - CBC no Diff; Future - Iron, TIBC and Ferritin Panel; Future - Testosterone, Total; Future  3. Diabetes mellitus type 2 in obese [facility]) Comments:  Unfortunately, her insurance has not approved Ozempic. Yet, she received a call from [facility] that A med was approved, she will call them and update me.  I will start Ozempic OR different medication on Thursday when she comes in for labwork.  - Hemoglobin A1c; Future  4. Episodic tension-type headache, not intractable Comments: Occasional, associated w/ nausea as well. Sx also suggestive of migraines. Advised to avoid known triggers and to keep BP well controlled.    COVID-19 Education: The signs and symptoms of COVID-19 were discussed with the patient and how to seek care for testing (follow up with  PCP or arrange E-visit).  The importance of social distancing was discussed today.  Patient Risk:   After full review of this patients clinical status, I feel that they are at least moderate risk at this time.  Time:   Today, I have spent 14 minutes/ seconds with the patient with telehealth technology discussing above diagnoses.     Medication Adjustments/Labs and Tests Ordered: Current medicines are reviewed at length with the patient today.  Concerns regarding medicines are outlined above.   Tests Ordered: Orders Placed This Encounter  Procedures   TSH   CBC no Diff   Iron, TIBC and Ferritin Panel   Testosterone, Total   Hemoglobin A1c   Ambulatory referral to Dermatology    Medication Changes: No orders of the defined types were placed in this encounter.   Disposition:  Follow up prn  Signed, [name], MD

## 2022-10-26 ENCOUNTER — Other Ambulatory Visit: Payer: Self-pay

## 2022-10-27 ENCOUNTER — Other Ambulatory Visit: Payer: BC Managed Care – PPO

## 2022-10-27 DIAGNOSIS — L659 Nonscarring hair loss, unspecified: Secondary | ICD-10-CM

## 2022-10-27 DIAGNOSIS — E1169 Type 2 diabetes mellitus with other specified complication: Secondary | ICD-10-CM

## 2022-10-28 LAB — IRON,TIBC AND FERRITIN PANEL
Ferritin: 56 ng/mL (ref 15–150)
Iron Saturation: 26 % (ref 15–55)
Iron: 103 ug/dL (ref 27–159)
Total Iron Binding Capacity: 391 ug/dL (ref 250–450)
UIBC: 288 ug/dL (ref 131–425)

## 2022-10-28 LAB — CBC
Hematocrit: 45.5 % (ref 34.0–46.6)
Hemoglobin: 15.2 g/dL (ref 11.1–15.9)
MCH: 26.7 pg (ref 26.6–33.0)
MCHC: 33.4 g/dL (ref 31.5–35.7)
MCV: 80 fL (ref 79–97)
Platelets: 175 10*3/uL (ref 150–450)
RBC: 5.7 x10E6/uL — ABNORMAL HIGH (ref 3.77–5.28)
RDW: 15.9 % — ABNORMAL HIGH (ref 11.7–15.4)
WBC: 4.3 10*3/uL (ref 3.4–10.8)

## 2022-10-28 LAB — TSH: TSH: 1.58 u[IU]/mL (ref 0.450–4.500)

## 2022-10-28 LAB — HEMOGLOBIN A1C
Est. average glucose Bld gHb Est-mCnc: 160 mg/dL
Hgb A1c MFr Bld: 7.2 % — ABNORMAL HIGH (ref 4.8–5.6)

## 2022-10-28 LAB — TESTOSTERONE: Testosterone: 28 ng/dL (ref 8–60)

## 2022-11-01 ENCOUNTER — Other Ambulatory Visit: Payer: Self-pay

## 2022-11-01 ENCOUNTER — Encounter: Payer: Self-pay | Admitting: Internal Medicine

## 2022-11-01 MED ORDER — SEMAGLUTIDE (1 MG/DOSE) 4 MG/3ML ~~LOC~~ SOPN: 1.0000 mg | PEN_INJECTOR | SUBCUTANEOUS | 1 refills | Status: DC

## 2022-11-01 MED ORDER — OZEMPIC (0.25 OR 0.5 MG/DOSE) 2 MG/3ML ~~LOC~~ SOPN: 0.5000 mg | PEN_INJECTOR | SUBCUTANEOUS | 1 refills | Status: DC

## 2022-11-19 ENCOUNTER — Other Ambulatory Visit: Payer: Self-pay | Admitting: Primary Care

## 2022-11-19 DIAGNOSIS — J984 Other disorders of lung: Secondary | ICD-10-CM

## 2022-11-19 DIAGNOSIS — R0602 Shortness of breath: Secondary | ICD-10-CM

## 2022-12-27 ENCOUNTER — Other Ambulatory Visit: Payer: Self-pay | Admitting: Internal Medicine

## 2022-12-27 DIAGNOSIS — Z0000 Encounter for general adult medical examination without abnormal findings: Secondary | ICD-10-CM

## 2023-01-05 ENCOUNTER — Telehealth: Payer: Self-pay | Admitting: Primary Care

## 2023-01-06 NOTE — Telephone Encounter (Signed)
Left message for patient to call back  

## 2023-01-10 ENCOUNTER — Emergency Department (HOSPITAL_BASED_OUTPATIENT_CLINIC_OR_DEPARTMENT_OTHER)
Admission: EM | Admit: 2023-01-10 | Discharge: 2023-01-10 | Disposition: A | Discharge status: Home / Self Care | Payer: BC Managed Care – PPO | Attending: Emergency Medicine | Admitting: Emergency Medicine

## 2023-01-10 ENCOUNTER — Other Ambulatory Visit: Payer: Self-pay

## 2023-01-10 ENCOUNTER — Emergency Department (HOSPITAL_BASED_OUTPATIENT_CLINIC_OR_DEPARTMENT_OTHER): Payer: BC Managed Care – PPO

## 2023-01-10 ENCOUNTER — Encounter (HOSPITAL_BASED_OUTPATIENT_CLINIC_OR_DEPARTMENT_OTHER): Payer: Self-pay

## 2023-01-10 DIAGNOSIS — I10 Essential (primary) hypertension: Secondary | ICD-10-CM | POA: Insufficient documentation

## 2023-01-10 DIAGNOSIS — Z79899 Other long term (current) drug therapy: Secondary | ICD-10-CM | POA: Diagnosis not present

## 2023-01-10 DIAGNOSIS — R0789 Other chest pain: Secondary | ICD-10-CM | POA: Insufficient documentation

## 2023-01-10 LAB — CBC WITH DIFFERENTIAL/PLATELET
Abs Immature Granulocytes: 0.01 10*3/uL (ref 0.00–0.07)
Basophils Absolute: 0 10*3/uL (ref 0.0–0.1)
Basophils Relative: 0 %
Eosinophils Absolute: 0.1 10*3/uL (ref 0.0–0.5)
Eosinophils Relative: 2 %
HCT: 40.7 % (ref 36.0–46.0)
Hemoglobin: 14 g/dL (ref 12.0–15.0)
Immature Granulocytes: 0 %
Lymphocytes Relative: 42 %
Lymphs Abs: 2 10*3/uL (ref 0.7–4.0)
MCH: 26.3 pg (ref 26.0–34.0)
MCHC: 34.4 g/dL (ref 30.0–36.0)
MCV: 76.5 fL — ABNORMAL LOW (ref 80.0–100.0)
Monocytes Absolute: 0.5 10*3/uL (ref 0.1–1.0)
Monocytes Relative: 10 %
Neutro Abs: 2.1 10*3/uL (ref 1.7–7.7)
Neutrophils Relative %: 46 %
Platelets: 187 10*3/uL (ref 150–400)
RBC: 5.32 MIL/uL — ABNORMAL HIGH (ref 3.87–5.11)
RDW: 15.6 % — ABNORMAL HIGH (ref 11.5–15.5)
WBC: 4.7 10*3/uL (ref 4.0–10.5)
nRBC: 0 % (ref 0.0–0.2)

## 2023-01-10 LAB — BASIC METABOLIC PANEL
Anion gap: 6 (ref 5–15)
BUN: 10 mg/dL (ref 6–20)
CO2: 23 mmol/L (ref 22–32)
Calcium: 8.6 mg/dL — ABNORMAL LOW (ref 8.9–10.3)
Chloride: 105 mmol/L (ref 98–111)
Creatinine, Ser: 0.87 mg/dL (ref 0.44–1.00)
GFR, Estimated: >60 mL/min (ref >60)
Glucose, Bld: 144 mg/dL — ABNORMAL HIGH (ref 70–99)
Potassium: 3.5 mmol/L (ref 3.5–5.1)
Sodium: 134 mmol/L — ABNORMAL LOW (ref 135–145)

## 2023-01-10 LAB — TROPONIN I (HIGH SENSITIVITY): Troponin I (High Sensitivity): 8 ng/L (ref <18)

## 2023-01-10 LAB — HCG, SERUM, QUALITATIVE: Preg, Serum: NEGATIVE

## 2023-01-10 MED ORDER — KETOROLAC TROMETHAMINE 15 MG/ML IJ SOLN
15.0000 mg | Freq: Once | INTRAMUSCULAR | Status: AC
  Administered 2023-01-10: 15 mg via INTRAVENOUS
  Filled 2023-01-10: qty 1

## 2023-01-10 MED ORDER — NAPROXEN 375 MG PO TABS: ORAL_TABLET | ORAL | 0 refills | Status: DC

## 2023-01-10 NOTE — Telephone Encounter (Signed)
I spoke to pt and she states she has to turn in her cpap machine to [place] because it stopped working. And [facility] needs a new referral order for a new cpap machine. I informed the pt that she needs a ov for the new referral because it's been more than 3 months since she's had the machine. Pt verbalized understanding has an appt on 01/20/23 with Derl Barrow, NP. Nothing further needed at this time.

## 2023-01-10 NOTE — ED Provider Notes (Signed)
[facility] Provider Note: Georgena Spurling, MD, FACEP  CSN: [id number] MRN: [id number] ARRIVAL: 01/10/23 at [place]: [place]  Chest Pain   HISTORY OF PRESENT ILLNESS  01/10/23 4:16 AM Kimberly Heath is a 32 y.o. female with a history of sleep apnea not currently using her CPAP.  She is here with about a week and a half of chest pain.  The chest pain has been occurring episodically both in the day and at night but more frequently and more severely at night.  Thought chest pain is located on the left side of her chest and feels like pressure.  It is fairly diffuse and not well localized but does not radiate to the left arm.  She rates it as an 8 out of 10.  When the chest pain occurs she feels short of breath.  The pain is worse lying on her side.  She is not diaphoretic with it.  Her current episode began about midnight and has persisted.   Past Medical History:  Diagnosis Date   Anxiety    Back pain    Chest pain    Depression    Edema, lower extremity    Fluid collection (edema) in the arms, legs, hands and feet    Hypertension    Pericardial effusion    Sleep apnea    SOB (shortness of breath)     History reviewed. No pertinent surgical history.  Family History  Problem Relation Age of Onset   Healthy Mother    High blood pressure Father    Sickle cell trait Father     Social History   Tobacco Use   Smoking status: Never    Passive exposure: Never   Smokeless tobacco: Never  Vaping Use   Vaping Use: Never used  Substance Use Topics   Alcohol use: Yes    Comment: occ   Drug use: No    Prior to Admission medications   Medication Sig Start Date End Date Taking? Authorizing Provider  naproxen (NAPROSYN) 375 MG tablet Take 1 tablet twice daily as needed for chest wall pain. 01/10/23  Yes Bexleigh Theriault, MD  albuterol (VENTOLIN HFA) 108 (90 Base) MCG/ACT inhaler Inhale 2 puffs into the lungs every 6 (six) hours as needed for  wheezing or shortness of breath. 07/27/22   Glendale Chard, MD  budesonide-formoterol [facility]) 80-4.5 MCG/ACT inhaler INHALE 2 PUFFS INTO THE LUNGS IN THE MORNING AND AT BEDTIME 11/22/22   Martyn Ehrich, NP  carvedilol (COREG) 25 MG tablet Take 1 tablet (25 mg total) by mouth 2 (two) times daily with a meal. 07/27/22   Glendale Chard, MD  famotidine (PEPCID) 20 MG tablet TAKE 1 TABLET(20 MG) BY MOUTH DAILY 10/10/22   Glendale Chard, MD  minocycline (DYNACIN) 50 MG tablet Take 1 tablet (50 mg total) by mouth 2 (two) times daily. 07/31/22   Glendale Chard, MD  montelukast (SINGULAIR) 10 MG tablet Take 1 tablet (10 mg total) by mouth daily. 07/27/22 07/27/23  Glendale Chard, MD  norethindrone (AYGESTIN) 5 MG tablet TAKE 2 TABLETS(10 MG) BY MOUTH EVERY DAY 12/27/22   Glendale Chard, MD  Semaglutide, 1 MG/DOSE, 4 MG/3ML SOPN Inject 1 mg into the skin once a week. 11/01/22   Glendale Chard, MD    Allergies Mushroom extract complex   REVIEW OF SYSTEMS  Negative except as noted here or in the History of Present Illness.   PHYSICAL EXAMINATION  Initial Vital Signs Blood pressure Marland Kitchen)  158/106, pulse 83, temperature 98.1 F (36.7 C), temperature source Oral, resp. rate 14, SpO2 100 %.  Examination General: Well-developed, well-nourished female in no acute distress; appearance consistent with age of record HENT: normocephalic; atraumatic Eyes: Normal appearance Neck: supple Heart: regular rate and rhythm Lungs: clear to auscultation bilaterally Chest: Left-sided chest tenderness Abdomen: soft; nondistended; nontender; bowel sounds present Extremities: No deformity; full range of motion; pulses normal Neurologic: Awake, alert and oriented; motor function intact in all extremities and symmetric; no facial droop Skin: Warm and dry Psychiatric: Normal mood and affect   RESULTS  Summary of this visit's results, reviewed and interpreted by myself:   EKG Interpretation  Date/Time:  Tuesday  January 10 2023 04:16:04 EST Ventricular Rate:  84 PR Interval:  132 QRS Duration: 88 QT Interval:  374 QTC Calculation: 443 R Axis:   87 Text Interpretation: Sinus rhythm Probable left atrial enlargement Borderline T wave abnormalities No significant change was found Confirmed by Shanon Rosser [phone]) on 01/10/2023 4:24:15 AM       Laboratory Studies: Results for orders placed or performed during the hospital encounter of 01/10/23 (from the past 24 hour(s))  CBC with Differential     Status: Abnormal   Collection Time: 01/10/23  4:48 AM  Result Value Ref Range   WBC 4.7 4.0 - 10.5 K/uL   RBC 5.32 (H) 3.87 - 5.11 MIL/uL   Hemoglobin 14.0 12.0 - 15.0 g/dL   HCT 40.7 36.0 - 46.0 %   MCV 76.5 (L) 80.0 - 100.0 fL   MCH 26.3 26.0 - 34.0 pg   MCHC 34.4 30.0 - 36.0 g/dL   RDW 15.6 (H) 11.5 - 15.5 %   Platelets 187 150 - 400 K/uL   nRBC 0.0 0.0 - 0.2 %   Neutrophils Relative % 46 %   Neutro Abs 2.1 1.7 - 7.7 K/uL   Lymphocytes Relative 42 %   Lymphs Abs 2.0 0.7 - 4.0 K/uL   Monocytes Relative 10 %   Monocytes Absolute 0.5 0.1 - 1.0 K/uL   Eosinophils Relative 2 %   Eosinophils Absolute 0.1 0.0 - 0.5 K/uL   Basophils Relative 0 %   Basophils Absolute 0.0 0.0 - 0.1 K/uL   Immature Granulocytes 0 %   Abs Immature Granulocytes 0.01 0.00 - 0.07 K/uL  Troponin I (High Sensitivity)     Status: None   Collection Time: 01/10/23  4:48 AM  Result Value Ref Range   Troponin I (High Sensitivity) 8 <18 ng/L  Basic metabolic panel     Status: Abnormal   Collection Time: 01/10/23  4:48 AM  Result Value Ref Range   Sodium 134 (L) 135 - 145 mmol/L   Potassium 3.5 3.5 - 5.1 mmol/L   Chloride 105 98 - 111 mmol/L   CO2 23 22 - 32 mmol/L   Glucose, Bld 144 (H) 70 - 99 mg/dL   BUN 10 6 - 20 mg/dL   Creatinine, Ser 0.87 0.44 - 1.00 mg/dL   Calcium 8.6 (L) 8.9 - 10.3 mg/dL   GFR, Estimated >60 >60 mL/min   Anion gap 6 5 - 15  hCG, serum, qualitative     Status: None   Collection Time: 01/10/23   4:48 AM  Result Value Ref Range   Preg, Serum NEGATIVE NEGATIVE   Imaging Studies: DG Chest 2 View  Result Date: 01/10/2023 CLINICAL DATA:  Chest pain, shortness of breath. EXAM: CHEST - 2 VIEW COMPARISON:  03/31/2022. FINDINGS: The heart size and mediastinal  contours are within normal limits. No consolidation, effusion, or pneumothorax. No acute osseous abnormality. IMPRESSION: No active cardiopulmonary disease. Electronically Signed   By: Brett Fairy M.D.   On: 01/10/2023 04:47    ED COURSE and MDM  Nursing notes, initial and subsequent vitals signs, including pulse oximetry, reviewed and interpreted by myself.  Vitals:   01/10/23 0415  BP: (!) 158/106  Pulse: 83  Resp: 14  Temp: 98.1 F (36.7 C)  TempSrc: Oral  SpO2: 100%   Medications  ketorolac (TORADOL) 15 MG/ML injection 15 mg (15 mg Intravenous Given 01/10/23 0516)   5:39 AM Chest pain is reproducible, positional, and improved with IV Toradol.  I believe this represents chest wall pain.  Her EKG is nonischemic and her troponin is normal.  Her pain has been present for long enough that her troponin should have been elevated for this a cardiac ischemic event.   PROCEDURES  Procedures   ED DIAGNOSES     ICD-10-CM   1. Chest wall pain  R07.89          Shanon Rosser, MD 01/10/23 [phone]

## 2023-01-12 NOTE — Telephone Encounter (Signed)
She needs visit to sort out. Looks like she has one scheduled for 01/20/23.  Make sure she is using Symbicort two puffs morning and evening; Advised she use Albuterol every 4-6 hours for chest tightness

## 2023-01-12 NOTE — Telephone Encounter (Signed)
Followed by Volanda Napoleon NP

## 2023-01-16 ENCOUNTER — Ambulatory Visit: Payer: BC Managed Care – PPO | Admitting: Primary Care

## 2023-01-20 ENCOUNTER — Encounter: Payer: Self-pay | Admitting: Primary Care

## 2023-01-20 ENCOUNTER — Ambulatory Visit (INDEPENDENT_AMBULATORY_CARE_PROVIDER_SITE_OTHER): Payer: BC Managed Care – PPO | Admitting: Primary Care

## 2023-01-20 VITALS — BP 142/78 | HR 95 | Temp 97.7°F | Ht 65.0 in | Wt 282.0 lb

## 2023-01-20 DIAGNOSIS — J984 Other disorders of lung: Secondary | ICD-10-CM | POA: Diagnosis not present

## 2023-01-20 DIAGNOSIS — G4733 Obstructive sleep apnea (adult) (pediatric): Secondary | ICD-10-CM | POA: Diagnosis not present

## 2023-01-20 NOTE — Assessment & Plan Note (Signed)
-   Dyspnea improved with daily ICS/LABA. Continue Symbicort 70m two puffs twice daily

## 2023-01-20 NOTE — Assessment & Plan Note (Signed)
-   Patient has severe OSA, HST 05/21/23 >> AHI 67.8/hour with SpO2 low 78%/ She was started on auto CPAP in July 2023. She was consistent with use and reported benefit from wearing prior to CPAP machine malfunctioning. Her DME company came to pick up CPAP unit and did not provide her with a new one. Our office was not notified of discontinuation. We will be placing an urgent DME order for patient to be provided a replacement machine. Encourage she resume use as soon as she is able to get new CPAP unit. She will need follow-up 31-90 days after for compliance check.

## 2023-01-20 NOTE — Patient Instructions (Signed)
Recommendations: - Resume CPAP once you get a new machine. Notify me if you have any trouble with machine or DME company  - Continue Symbicort two puffs morning and evening   Orders: - Please provide patient with replacement CPAP (urgent priority)  Follow-up: - 6 weeks with Eustaquio Maize NP

## 2023-01-20 NOTE — Progress Notes (Addendum)
$'@Patient'h$  ID: Kimberly Heath, female    DOB: 05-09-1991, 32 y.o.   MRN: [id number]  Chief Complaint  Patient presents with   Follow-up    OSA    Referring provider: Glendale Chard, MD  HPI: 32 year old female, never smoked.  Past medical history significant for pulmonary hypertension, obesity, vitamin D deficiency, sickle cell trait.   Previous [facility] pulmonary encounters: 03/31/2022 Patient presents today for sleep consult. Self referred. Patient reports trouble falling and staying asleep at night. Associated symptoms of snoring, dry mouth and morning headache. Kimberly Heath has never had prior sleep study. Kimberly Heath was recently started on Wegovy (semaglutide) to help assist with weight loss in March. Typical bedtime is between 12am-2am. It can take her anywhere from 30 mins to several hours to fall asleep. Kimberly Heath wakes up 4-5 times a night. Kimberly Heath starts her day at either 7:30am or 10am.   Kimberly Heath also has issues with breathing. Symptoms have been present for 3 months after an upper respiratory infection. Kimberly Heath has associated sinus and chest congestion. Kimberly Heath is coughing up mucus. States that her breathing has been more labored. Kimberly Heath does not feel it is related to her weight since Kimberly Heath has lost weight over the last 6 months. No chest tightness or wheezing.   Sleep questionnaire Symptoms- Insomnia, restless sleep, snoring, dry mouth, morning headache  Prior sleep study- None Bedtime- 12am-2am Time to fall asleep- 25mns to several hours Nocturnal awakenings- 4-5 times a night Out of bed/start of day- 7:30am or 10am Weight changes- down this year Do you operate heavy machinery- No Do you currently wear CPAP- No Do you current wear oxygen- No Epworth- 12   05/25/2022 Patient presents today for 6 to 8-week follow-up with PFTs. Shortness of breath is the same.  Chest x-ray on 03/31/2022 showed clear lungs. Pulmonary function testing today showed moderate restriction with positive bronchodilator response. Patient had home  sleep study last week, results are not yet available.  Patient is following with cardiology, last seen on 04/15/2022.  Kimberly Heath has had an echocardiogram in the past that showed PA pressure of 321mG.  Repeat echocardiogram on 04/22/2022 showed normal LV systolic function with EF 56% and normal diastolic filling pattern.  Cardiology will see patient back as as-needed basis. Patient is currently on Wegovy for weight loss  06/22/2022 Patient presents today for 6-8 week follow-up/restrictive lung disease.  Pulmonary function testing showed moderate restriction in lung function with positive bronchodilator response consistent with asthma/obesity.  We started patient on a trial of ICS/LABA to help with shortness of breath symptoms.  Respiratory allergy panel, eosinophil absolute and IgE were normal.  Kimberly Heath is doing well today. Symbicort helped with shortness of breath. No longer feels as though Kimberly Heath is suffocating. Able to walk up stairs. No wheezing or cough. Sleep study reviewed today which showed severe OSA. Recommending CPAP start, Kimberly Heath is in agreement with plan.   Severe obstructive sleep apnea - Patient has symptoms of snoring, restless sleep and morning HA.  05/20/22 HST>> Severe obstructive sleep apnea, AHI 67.8/hr with SpO2 low 78%.  Reviewed sleep study results.  Recommending patient be started on CPAP due to severity of OSA.  Patient is in agreement with plan.  We will place an order for patient to be started on auto CPAP 5 to 20 cm H2O with mask of choice.  Encourage weight loss efforts.  Advised against driving if experiencing excessive daytime sleepiness or fatigue.  Instructed patient to aim to wear CPAP every night for minimum 4  to 6 hours or longer.  Follow-up 31 to 90 days after CPAP start for compliance check.  Restrictive lung disease - Clinical symptoms and PFTs consistent with asthma. FENO and RAST allergy panel were normal. Obesity contributing to restrictive lung disease. Dyspnea symptoms improved  on low dose ICS/LABA. Continue Symbicort 68mg two puff twice daily. Encourage weight loss efforts.     01/20/2023- Interim hx  Patient presents today for OSA follow-up.  05/20/22 HST>> Severe obstructive sleep apnea, AHI 67.8/hr with SpO2 low 78%. Kimberly Heath was started on auto CPAP back in July 2023. Her CPAP machine stopped working in January 2023. Prior to this Kimberly Heath was consistently wearing her CPAP and reports benefit from use. Kimberly Heath tells me Kimberly Heath received message on CPAP machine about humidifier not working, Kimberly Heath would put water in humidifier chamber and the level stayed the same. [employer] came and picked up machine but did not provide her with a replacement. Kimberly Heath needs new machine. Kimberly Heath was recently seen in ED for chest pain. Chest pain comes and goes, worse at night. Kimberly Heath feels it is brought on by anxiety. Kimberly Heath has hx asthma, Kimberly Heath uses Symbicort twice daily. Dyspnea improved with daily ICS/LABA.   Airview download 10/10/22-01/07/23 60/90 days used; 63% > 4 hours Average usage days used 6 hours 13 mins Pressure 5-20cm h20 Airleaks 24.5L/min AHI 0.4    Treadmill exercise stress test 07/28/2017: Indication chest pain and dyspnea on exertion. Resting EKG demonstrated normal sinus rhythm.  Patient exercised on Bruce protocol for 7 minutes and 45 seconds and achieved 86% of MPHR.  Also achieved 9.76 months of workload. Stress terminated due to fatigue.  Normal blood pressure response.  There was no ST-T wave changes of ischemia.  No significant arrhythmias.   Echocardiogram 12/20/2017: Normal LV size, moderate LVH, normal wall motion.  Normal diastolic filling pattern.  LVEF calculated at 69%. Mild tricuspid regurgitation.  No evidence of pulm hypertension.  Estimated PASP 30 mmHg. Insignificant pericardial effusion.  Pulmonary function testing:  05/25/2022 PFTs>> FVC 2.66 (72%), FEV1 2.26  (73%), ratio 85, DLCO 23.56 (108%) Interpretation: Moderate obstructive airways disease, restriction of exhaled volume,  slight BD response  Sleep study: 05/20/22 HST>> Severe obstructive sleep apnea, AHI 67.8/hr with SpO2 low 78%    Allergies  Allergen Reactions   Mushroom Extract Complex Other (See Comments)    Flu like symptoms      There is no immunization history on file for this patient.  Past Medical History:  Diagnosis Date   Anxiety    Back pain    Chest pain    Depression    Edema, lower extremity    Fluid collection (edema) in the arms, legs, hands and feet    Hypertension    Pericardial effusion    Sleep apnea    SOB (shortness of breath)     Tobacco History: Social History   Tobacco Use  Smoking Status Never   Passive exposure: Never  Smokeless Tobacco Never   Counseling given: Not Answered   Outpatient Medications Prior to Visit  Medication Sig Dispense Refill   albuterol (VENTOLIN HFA) 108 (90 Base) MCG/ACT inhaler Inhale 2 puffs into the lungs every 6 (six) hours as needed for wheezing or shortness of breath. 54 g 2   budesonide-formoterol (SYMBICORT) 80-4.5 MCG/ACT inhaler INHALE 2 PUFFS INTO THE LUNGS IN THE MORNING AND AT BEDTIME 10.2 g 2   carvedilol (COREG) 25 MG tablet Take 1 tablet (25 mg total) by mouth 2 (two) times daily with  a meal. 180 tablet 1   famotidine (PEPCID) 20 MG tablet TAKE 1 TABLET(20 MG) BY MOUTH DAILY 90 tablet 1   minocycline (DYNACIN) 50 MG tablet Take 1 tablet (50 mg total) by mouth 2 (two) times daily. 180 tablet 0   montelukast (SINGULAIR) 10 MG tablet Take 1 tablet (10 mg total) by mouth daily. 90 tablet 2   naproxen (NAPROSYN) 375 MG tablet Take 1 tablet twice daily as needed for chest wall pain. 20 tablet 0   norethindrone (AYGESTIN) 5 MG tablet TAKE 2 TABLETS(10 MG) BY MOUTH EVERY DAY 180 tablet 0   Semaglutide, 1 MG/DOSE, 4 MG/3ML SOPN Inject 1 mg into the skin once a week. 3 mL 1   No facility-administered medications prior to visit.   Review of Systems  Review of Systems  Constitutional: Negative.   HENT: Negative.     Respiratory: Negative.  Negative for cough.   Cardiovascular:  Positive for chest pain.    Physical Exam  BP (!) 142/78 (BP Location: Left Arm, Patient Position: Sitting, Cuff Size: Large)   Pulse 95   Temp 97.7 F (36.5 C) (Temporal)   Ht '5\' 5"'$  (1.651 m)   Wt 282 lb (127.9 kg)   SpO2 100%   BMI 46.93 kg/m  Physical Exam Constitutional:      General: Kimberly Heath is not in acute distress.    Appearance: Normal appearance. Kimberly Heath is not ill-appearing.  HENT:     Head: Normocephalic and atraumatic.     Mouth/Throat:     Mouth: Mucous membranes are dry.     Pharynx: Oropharynx is clear.  Cardiovascular:     Rate and Rhythm: Normal rate and regular rhythm.  Pulmonary:     Effort: Pulmonary effort is normal.     Breath sounds: Normal breath sounds. No wheezing, rhonchi or rales.  Musculoskeletal:        General: Normal range of motion.  Skin:    General: Skin is warm and dry.  Neurological:     General: No focal deficit present.     Mental Status: Kimberly Heath is alert and oriented to person, place, and time. Mental status is at baseline.  Psychiatric:        Mood and Affect: Mood normal.        Behavior: Behavior normal.        Thought Content: Thought content normal.        Judgment: Judgment normal.      Lab Results:  CBC    Component Value Date/Time   WBC 4.7 01/10/2023 0448   RBC 5.32 (H) 01/10/2023 0448   HGB 14.0 01/10/2023 0448   HGB 15.2 10/27/2022 1142   HCT 40.7 01/10/2023 0448   HCT 45.5 10/27/2022 1142   PLT 187 01/10/2023 0448   PLT 175 10/27/2022 1142   MCV 76.5 (L) 01/10/2023 0448   MCV 80 10/27/2022 1142   MCH 26.3 01/10/2023 0448   MCHC 34.4 01/10/2023 0448   RDW 15.6 (H) 01/10/2023 0448   RDW 15.9 (H) 10/27/2022 1142   LYMPHSABS 2.0 01/10/2023 0448   LYMPHSABS 1.7 05/08/2019 0000   MONOABS 0.5 01/10/2023 0448   EOSABS 0.1 01/10/2023 0448   EOSABS 0.0 05/08/2019 0000   BASOSABS 0.0 01/10/2023 0448   BASOSABS 0.0 05/08/2019 0000    BMET    Component  Value Date/Time   NA 134 (L) 01/10/2023 0448   NA 140 07/27/2022 1217   K 3.5 01/10/2023 0448   CL 105 01/10/2023 0448  CO2 23 01/10/2023 0448   GLUCOSE 144 (H) 01/10/2023 0448   BUN 10 01/10/2023 0448   BUN 5 (L) 07/27/2022 1217   CREATININE 0.87 01/10/2023 0448   CALCIUM 8.6 (L) 01/10/2023 0448   GFRNONAA >60 01/10/2023 0448   GFRAA 117 05/08/2019 0000    BNP    Component Value Date/Time   BNP 11.3 05/01/2021 1250    ProBNP No results found for: "PROBNP"  Imaging: DG Chest 2 View  Result Date: 01/10/2023 CLINICAL DATA:  Chest pain, shortness of breath. EXAM: CHEST - 2 VIEW COMPARISON:  03/31/2022. FINDINGS: The heart size and mediastinal contours are within normal limits. No consolidation, effusion, or pneumothorax. No acute osseous abnormality. IMPRESSION: No active cardiopulmonary disease. Electronically Signed   By: Brett Fairy M.D.   On: 01/10/2023 04:47     Assessment & Plan:   Severe obstructive sleep apnea - Patient has severe OSA, HST 05/21/23 >> AHI 67.8/hour with SpO2 low 78%/ Kimberly Heath was started on auto CPAP in July 2023. Kimberly Heath was consistent with use and reported benefit from wearing prior to CPAP machine malfunctioning. Her DME company came to pick up CPAP unit and did not provide her with a new one. Our office was not notified of discontinuation. We will be placing an urgent DME order for patient to be provided a replacement machine. Encourage Kimberly Heath resume use as soon as Kimberly Heath is able to get new CPAP unit. Kimberly Heath will need follow-up 31-90 days after for compliance check.   Restrictive lung disease - Dyspnea improved with daily ICS/LABA. Continue Symbicort '80mg'$  two puffs twice daily   Martyn Ehrich, NP 01/20/2023

## 2023-01-20 NOTE — Progress Notes (Signed)
Reviewed and agree with assessment/plan.   Chesley Mires, MD [facility] Pulmonary/Critical Care 01/20/2023, 5:03 PM Pager:  [phone]

## 2023-01-23 ENCOUNTER — Telehealth: Payer: Self-pay | Admitting: Primary Care

## 2023-01-23 DIAGNOSIS — G4733 Obstructive sleep apnea (adult) (pediatric): Secondary | ICD-10-CM

## 2023-01-23 NOTE — Telephone Encounter (Signed)
Ok to keep current auto settings 5-20cm h20

## 2023-01-23 NOTE — Telephone Encounter (Signed)
Cpap order updated. Nothing further needed

## 2023-01-23 NOTE — Telephone Encounter (Signed)
Learta Codding  [place], Merlyn Albert, [place]; [place], Posey Pronto afternoon. The rx placed does not have settings for the pap. we are unable to provide without settings. Please have updated rx created with settings and let us know when it is available to pull.  thank you,  Magda Paganini

## 2023-01-23 NOTE — Telephone Encounter (Signed)
[name],  Looked at old office note from last year 05/2022 and saw that setting were Auto cpap 5-20. Are you still ok with these same settings or did you want to adjust them.  Please advise

## 2023-01-25 LAB — COMPREHENSIVE METABOLIC PANEL
Albumin: 4.4 (ref 3.5–5.0)
Calcium: 9 (ref 8.7–10.7)
Globulin: 3.3
eGFR: 67

## 2023-01-25 LAB — BASIC METABOLIC PANEL
BUN: 10 (ref 4–21)
CO2: 26 — AB (ref 13–22)
Chloride: 106 (ref 99–108)
Creatinine: 1.1 (ref 0.5–1.1)
Glucose: 116
Potassium: 4.2 mEq/L (ref 3.5–5.1)
Sodium: 140 (ref 137–147)

## 2023-01-25 LAB — HEPATIC FUNCTION PANEL
ALT: 197 U/L — AB (ref 7–35)
AST: 70 — AB (ref 13–35)
Alkaline Phosphatase: 54 (ref 25–125)
Bilirubin, Total: 0.4

## 2023-01-26 ENCOUNTER — Ambulatory Visit (INDEPENDENT_AMBULATORY_CARE_PROVIDER_SITE_OTHER): Payer: BC Managed Care – PPO | Admitting: Internal Medicine

## 2023-01-26 ENCOUNTER — Encounter: Payer: Self-pay | Admitting: Internal Medicine

## 2023-01-26 ENCOUNTER — Ambulatory Visit: Payer: BC Managed Care – PPO | Admitting: Internal Medicine

## 2023-01-26 VITALS — BP 140/90 | HR 98 | Temp 98.5°F | Ht 65.0 in | Wt 280.2 lb

## 2023-01-26 DIAGNOSIS — Z6841 Body Mass Index (BMI) 40.0 and over, adult: Secondary | ICD-10-CM

## 2023-01-26 DIAGNOSIS — E1169 Type 2 diabetes mellitus with other specified complication: Secondary | ICD-10-CM | POA: Diagnosis not present

## 2023-01-26 DIAGNOSIS — K061 Gingival enlargement: Secondary | ICD-10-CM

## 2023-01-26 DIAGNOSIS — J4520 Mild intermittent asthma, uncomplicated: Secondary | ICD-10-CM

## 2023-01-26 DIAGNOSIS — I10 Essential (primary) hypertension: Secondary | ICD-10-CM

## 2023-01-26 NOTE — Patient Instructions (Signed)

## 2023-01-26 NOTE — Progress Notes (Signed)
Barnet Glasgow Martin,acting as a Education administrator for [name], MD.,have documented all relevant documentation on the behalf of [name], MD,as directed by  [name], MD while in the presence of [name], MD.    Subjective:     [name] , female    DOB: 1991-05-06 , 32 y.o.   MRN: [id number]   Chief Complaint  Patient presents with   Diabetes   Hypertension    HPI  Patient presents today for diabetes & blood pressure check. Patient reports compliance with medications. Patient states she is wondering if she should continue the Ozempic because she isn't losing weight. However, she is still on 0.5mg .  Patient had BMP checked yesterday at [facility]. Patient is sending results via mychart.  BP Readings from Last 3 Encounters: 01/26/23 : (!) 160/90 01/20/23 : (!) 142/78 01/10/23 : (!) 158/106    Hypertension This is a chronic problem. The current episode started more than 1 year ago. The problem has been gradually improving since onset. The problem is controlled. Pertinent negatives include no blurred vision, chest pain, headaches or neck pain. Risk factors for coronary artery disease include obesity. The current treatment provides moderate improvement.     Past Medical History:  Diagnosis Date   Anxiety    Back pain    Chest pain    Depression    Edema, lower extremity    Fluid collection (edema) in the arms, legs, hands and feet    Hypertension    Pericardial effusion    Sleep apnea    SOB (shortness of breath)      Family History  Problem Relation Age of Onset   Healthy Mother    High blood pressure Father    Sickle cell trait Father      Current Outpatient Medications:    albuterol (VENTOLIN HFA) 108 (90 Base) MCG/ACT inhaler, Inhale 2 puffs into the lungs every 6 (six) hours as needed for wheezing or shortness of breath., Disp: 54 g, Rfl: 2   budesonide-formoterol (SYMBICORT) 80-4.5 MCG/ACT inhaler, INHALE 2 PUFFS INTO THE  LUNGS IN THE MORNING AND AT BEDTIME, Disp: 10.2 g, Rfl: 2   carvedilol (COREG) 25 MG tablet, Take 1 tablet (25 mg total) by mouth 2 (two) times daily with a meal., Disp: 180 tablet, Rfl: 1   famotidine (PEPCID) 20 MG tablet, TAKE 1 TABLET(20 MG) BY MOUTH DAILY, Disp: 90 tablet, Rfl: 1   minocycline (DYNACIN) 50 MG tablet, Take 1 tablet (50 mg total) by mouth 2 (two) times daily., Disp: 180 tablet, Rfl: 0   montelukast (SINGULAIR) 10 MG tablet, Take 1 tablet (10 mg total) by mouth daily., Disp: 90 tablet, Rfl: 2   naproxen (NAPROSYN) 375 MG tablet, Take 1 tablet twice daily as needed for chest wall pain., Disp: 20 tablet, Rfl: 0   norethindrone (AYGESTIN) 5 MG tablet, TAKE 2 TABLETS(10 MG) BY MOUTH EVERY DAY, Disp: 180 tablet, Rfl: 0   Semaglutide, 1 MG/DOSE, 4 MG/3ML SOPN, Inject 1 mg into the skin once a week., Disp: 3 mL, Rfl: 1   Allergies  Allergen Reactions   Mushroom Extract Complex Other (See Comments)    Flu like symptoms      Review of Systems  Constitutional: Negative.   Eyes:  Negative for blurred vision.  Respiratory: Negative.    Cardiovascular: Negative.  Negative for chest pain.  Gastrointestinal: Negative.   Genitourinary: Negative.   Musculoskeletal: Negative.  Negative for neck pain.  Neurological: Negative.  Negative for headaches.  Psychiatric/Behavioral: Negative.       Today's Vitals   01/26/23 1510 01/26/23 1527  BP: (!) 160/90 (!) 140/90  Pulse: 98   Temp: 98.5 F (36.9 C)   TempSrc: Oral   Weight: 280 lb 3.2 oz (127.1 kg)   Height: 5\' 5"  (1.651 m)   PainSc: 0-No pain    Body mass index is 46.63 kg/m.  Wt Readings from Last 3 Encounters:  01/26/23 280 lb 3.2 oz (127.1 kg)  01/20/23 282 lb (127.9 kg)  07/27/22 273 lb 6.4 oz (124 kg)    Objective:  Physical Exam Vitals and nursing note reviewed.  Constitutional:      Appearance: Normal appearance. She is obese.  HENT:     Head: Normocephalic and atraumatic.     Nose:     Comments: Masked      Mouth/Throat:     Comments: Masked  Eyes:     Extraocular Movements: Extraocular movements intact.  Cardiovascular:     Rate and Rhythm: Normal rate and regular rhythm.     Heart sounds: Normal heart sounds.  Pulmonary:     Effort: Pulmonary effort is normal.     Breath sounds: Normal breath sounds.  Musculoskeletal:     Cervical back: Normal range of motion.  Skin:    General: Skin is warm.  Neurological:     General: No focal deficit present.     Mental Status: She is alert.  Psychiatric:        Mood and Affect: Mood normal.        Behavior: Behavior normal.     Assessment And Plan:     1. Diabetes mellitus type 2 in obese [facility]) Comments: Chronic, I plan to increase Ozempic to 1mg  weekly.  She is reminded Ozempic is prescribed to address DM, not obesity. F/u 3 months. - Hemoglobin A1c  2. Essential hypertension, benign Comments: She will c/w carvedilol, spironolactone added by Renal. Importance of medication/dietary compliance was stressed to the patient.  3. Mild intermittent asthma without complication Comments: Sx stable on montelukast and Symbicort.  She is encouraged to avoid known triggers.  4. Class 3 severe obesity due to excess calories with serious comorbidity and body mass index (BMI) of 45.0 to 49.9 in adult [facility]) Comments: She is encouraged to aim for at least 150 minutes of exercise/week, while initially striving for BMI<35 to decrease cardiac risk.   Patient was given opportunity to ask questions. Patient verbalized understanding of the plan and was able to repeat key elements of the plan. All questions were answered to their satisfaction.   I, [name], MD, have reviewed all documentation for this visit. The documentation on 01/26/23 for the exam, diagnosis, procedures, and orders are all accurate and complete.   IF YOU HAVE BEEN REFERRED TO A SPECIALIST, IT MAY TAKE 1-2 WEEKS TO SCHEDULE/PROCESS THE REFERRAL. IF YOU HAVE NOT HEARD FROM US/SPECIALIST  IN TWO WEEKS, PLEASE GIVE [place] A CALL AT [phone].   THE PATIENT IS ENCOURAGED TO PRACTICE SOCIAL DISTANCING DUE TO THE COVID-19 PANDEMIC.

## 2023-01-27 LAB — HEMOGLOBIN A1C
Est. average glucose Bld gHb Est-mCnc: 143 mg/dL
Hgb A1c MFr Bld: 6.6 % — ABNORMAL HIGH (ref 4.8–5.6)

## 2023-01-31 ENCOUNTER — Encounter: Payer: Self-pay | Admitting: Internal Medicine

## 2023-02-04 DIAGNOSIS — E1169 Type 2 diabetes mellitus with other specified complication: Secondary | ICD-10-CM | POA: Insufficient documentation

## 2023-02-04 DIAGNOSIS — J4520 Mild intermittent asthma, uncomplicated: Secondary | ICD-10-CM | POA: Insufficient documentation

## 2023-02-04 DIAGNOSIS — E1122 Type 2 diabetes mellitus with diabetic chronic kidney disease: Secondary | ICD-10-CM | POA: Insufficient documentation

## 2023-02-04 DIAGNOSIS — I10 Essential (primary) hypertension: Secondary | ICD-10-CM | POA: Insufficient documentation

## 2023-02-04 MED ORDER — SEMAGLUTIDE (1 MG/DOSE) 4 MG/3ML ~~LOC~~ SOPN: 1.0000 mg | PEN_INJECTOR | SUBCUTANEOUS | 1 refills | Status: DC

## 2023-02-05 ENCOUNTER — Other Ambulatory Visit: Payer: Self-pay | Admitting: Primary Care

## 2023-02-05 DIAGNOSIS — J984 Other disorders of lung: Secondary | ICD-10-CM

## 2023-02-05 DIAGNOSIS — R0602 Shortness of breath: Secondary | ICD-10-CM

## 2023-02-06 ENCOUNTER — Other Ambulatory Visit: Payer: Self-pay

## 2023-02-06 MED ORDER — FREESTYLE LIBRE 3 READER DEVI: 1.0000 | 1 refills | Status: AC

## 2023-02-06 MED ORDER — FREESTYLE LIBRE 3 SENSOR MISC: 1 refills | Status: DC

## 2023-02-22 ENCOUNTER — Other Ambulatory Visit: Payer: Self-pay | Admitting: Internal Medicine

## 2023-02-22 DIAGNOSIS — I10 Essential (primary) hypertension: Secondary | ICD-10-CM

## 2023-02-23 ENCOUNTER — Other Ambulatory Visit: Payer: Self-pay

## 2023-02-23 ENCOUNTER — Encounter (HOSPITAL_BASED_OUTPATIENT_CLINIC_OR_DEPARTMENT_OTHER): Payer: Self-pay | Admitting: Emergency Medicine

## 2023-02-23 ENCOUNTER — Emergency Department (HOSPITAL_BASED_OUTPATIENT_CLINIC_OR_DEPARTMENT_OTHER)
Admission: EM | Admit: 2023-02-23 | Discharge: 2023-02-23 | Disposition: A | Discharge status: Home / Self Care | Payer: BC Managed Care – PPO | Attending: Emergency Medicine | Admitting: Emergency Medicine

## 2023-02-23 DIAGNOSIS — J45909 Unspecified asthma, uncomplicated: Secondary | ICD-10-CM | POA: Diagnosis not present

## 2023-02-23 DIAGNOSIS — E119 Type 2 diabetes mellitus without complications: Secondary | ICD-10-CM | POA: Diagnosis not present

## 2023-02-23 DIAGNOSIS — R197 Diarrhea, unspecified: Secondary | ICD-10-CM | POA: Diagnosis not present

## 2023-02-23 DIAGNOSIS — R7401 Elevation of levels of liver transaminase levels: Secondary | ICD-10-CM | POA: Diagnosis not present

## 2023-02-23 DIAGNOSIS — I10 Essential (primary) hypertension: Secondary | ICD-10-CM | POA: Insufficient documentation

## 2023-02-23 DIAGNOSIS — Z79899 Other long term (current) drug therapy: Secondary | ICD-10-CM | POA: Diagnosis not present

## 2023-02-23 HISTORY — DX: Unspecified asthma, uncomplicated: J45.909

## 2023-02-23 HISTORY — DX: Type 2 diabetes mellitus without complications: E11.9

## 2023-02-23 LAB — URINALYSIS, ROUTINE W REFLEX MICROSCOPIC
Bilirubin Urine: NEGATIVE
Glucose, UA: NEGATIVE mg/dL
Hgb urine dipstick: NEGATIVE
Ketones, ur: NEGATIVE mg/dL
Leukocytes,Ua: NEGATIVE
Nitrite: NEGATIVE
Protein, ur: NEGATIVE mg/dL
Specific Gravity, Urine: 1.01 (ref 1.005–1.030)
pH: 6 (ref 5.0–8.0)

## 2023-02-23 LAB — COMPREHENSIVE METABOLIC PANEL
ALT: 163 U/L — ABNORMAL HIGH (ref 0–44)
AST: 53 U/L — ABNORMAL HIGH (ref 15–41)
Albumin: 3.7 g/dL (ref 3.5–5.0)
Alkaline Phosphatase: 37 U/L — ABNORMAL LOW (ref 38–126)
Anion gap: 6 (ref 5–15)
BUN: 11 mg/dL (ref 6–20)
CO2: 23 mmol/L (ref 22–32)
Calcium: 8.6 mg/dL — ABNORMAL LOW (ref 8.9–10.3)
Chloride: 106 mmol/L (ref 98–111)
Creatinine, Ser: 1.21 mg/dL — ABNORMAL HIGH (ref 0.44–1.00)
GFR, Estimated: >60 mL/min (ref >60)
Glucose, Bld: 119 mg/dL — ABNORMAL HIGH (ref 70–99)
Potassium: 3.7 mmol/L (ref 3.5–5.1)
Sodium: 135 mmol/L (ref 135–145)
Total Bilirubin: 0.5 mg/dL (ref 0.3–1.2)
Total Protein: 7.9 g/dL (ref 6.5–8.1)

## 2023-02-23 LAB — CBC
HCT: 39.4 % (ref 36.0–46.0)
Hemoglobin: 13.4 g/dL (ref 12.0–15.0)
MCH: 26.6 pg (ref 26.0–34.0)
MCHC: 34 g/dL (ref 30.0–36.0)
MCV: 78.3 fL — ABNORMAL LOW (ref 80.0–100.0)
Platelets: 188 10*3/uL (ref 150–400)
RBC: 5.03 MIL/uL (ref 3.87–5.11)
RDW: 15.1 % (ref 11.5–15.5)
WBC: 4 10*3/uL (ref 4.0–10.5)
nRBC: 0 % (ref 0.0–0.2)

## 2023-02-23 LAB — LIPASE, BLOOD: Lipase: 45 U/L (ref 11–51)

## 2023-02-23 LAB — PREGNANCY, URINE: Preg Test, Ur: NEGATIVE

## 2023-02-23 LAB — CBG MONITORING, ED: Glucose-Capillary: 130 mg/dL — ABNORMAL HIGH (ref 70–99)

## 2023-02-23 MED ORDER — SODIUM CHLORIDE 0.9 % IV BOLUS
1000.0000 mL | Freq: Once | INTRAVENOUS | Status: AC
  Administered 2023-02-23: 1000 mL via INTRAVENOUS

## 2023-02-23 NOTE — ED Provider Notes (Signed)
[facility] Provider Note   CSN: [id number] Arrival date & time: 02/23/23  1805     History  Chief Complaint  Patient presents with   Diarrhea    Kimberly Heath is a 32 y.o. female with history of hypertension, anxiety, sleep apnea, depression, diabetes, and asthma who presents the emergency department complaining of diarrhea and generalized weakness.  Patient states that she has been having diarrhea with every bowel movement for the past 5 days or so.  She did recently finish a course of spironolactone per her dermatologist, finished this 2 days before symptoms started.  She states symptoms started few hours after she used a juicer and was using leafy greens.  She states that a month ago she had seen a kidney doctor and was told that her liver function testing was elevated, but was just told to watch her diet.  She denies any recent travel, changes in food or other changes in medications.  Denies fever, has had mild nausea without vomiting.  No abdominal pain.   Diarrhea Associated symptoms: no abdominal pain, no chills, no fever and no vomiting        Home Medications Prior to Admission medications   Medication Sig Start Date End Date Taking? Authorizing Provider  albuterol (VENTOLIN HFA) 108 (90 Base) MCG/ACT inhaler Inhale 2 puffs into the lungs every 6 (six) hours as needed for wheezing or shortness of breath. 07/27/22   Glendale Chard, MD  budesonide-formoterol [facility]) 80-4.5 MCG/ACT inhaler INHALE 2 PUFFS INTO THE LUNGS IN THE MORNING AND AT BEDTIME 02/06/23   Martyn Ehrich, NP  carvedilol (COREG) 25 MG tablet TAKE 1 TABLET(25 MG) BY MOUTH TWICE DAILY WITH A MEAL 02/23/23   Glendale Chard, MD  Continuous Blood Gluc Receiver (FREESTYLE LIBRE 3 READER) DEVI 1 applicator by Does not apply route as directed. 02/06/23   Glendale Chard, MD  Continuous Blood Gluc Sensor (FREESTYLE LIBRE 3 SENSOR) MISC USE AS DIRECTED TO CHECK BLOOD  SUGAR. 02/06/23   Glendale Chard, MD  famotidine (PEPCID) 20 MG tablet TAKE 1 TABLET(20 MG) BY MOUTH DAILY 10/10/22   Glendale Chard, MD  minocycline (DYNACIN) 50 MG tablet Take 1 tablet (50 mg total) by mouth 2 (two) times daily. 07/31/22   Glendale Chard, MD  montelukast (SINGULAIR) 10 MG tablet Take 1 tablet (10 mg total) by mouth daily. 07/27/22 07/27/23  Glendale Chard, MD  naproxen (NAPROSYN) 375 MG tablet Take 1 tablet twice daily as needed for chest wall pain. 01/10/23   Molpus, John, MD  norethindrone (AYGESTIN) 5 MG tablet TAKE 2 TABLETS(10 MG) BY MOUTH EVERY DAY 12/27/22   Glendale Chard, MD  Semaglutide, 1 MG/DOSE, 4 MG/3ML SOPN Inject 1 mg into the skin once a week. 02/04/23   Glendale Chard, MD      Allergies    Mushroom extract complex    Review of Systems   Review of Systems  Constitutional:  Negative for chills and fever.  Gastrointestinal:  Positive for diarrhea and nausea. Negative for abdominal pain and vomiting.  All other systems reviewed and are negative.   Physical Exam Updated Vital Signs BP (!) 141/85 (BP Location: Left Arm)   Pulse 92   Temp 98.3 F (36.8 C) (Oral)   Resp 20   Ht 5\' 3"  (1.6 m)   Wt 127.1 kg   SpO2 100%   BMI 49.64 kg/m  Physical Exam Vitals and nursing note reviewed.  Constitutional:  Appearance: Normal appearance.  HENT:     Head: Normocephalic and atraumatic.  Eyes:     Conjunctiva/sclera: Conjunctivae normal.  Cardiovascular:     Rate and Rhythm: Normal rate and regular rhythm.  Pulmonary:     Effort: Pulmonary effort is normal. No respiratory distress.     Breath sounds: Normal breath sounds.  Abdominal:     General: There is no distension.     Palpations: Abdomen is soft.     Tenderness: There is no abdominal tenderness.  Skin:    General: Skin is warm and dry.  Neurological:     General: No focal deficit present.     Mental Status: She is alert.     ED Results / Procedures / Treatments   Labs (all labs ordered are  listed, but only abnormal results are displayed) Labs Reviewed  COMPREHENSIVE METABOLIC PANEL - Abnormal; Notable for the following components:      Result Value   Glucose, Bld 119 (*)    Creatinine, Ser 1.21 (*)    Calcium 8.6 (*)    AST 53 (*)    ALT 163 (*)    Alkaline Phosphatase 37 (*)    All other components within normal limits  CBC - Abnormal; Notable for the following components:   MCV 78.3 (*)    All other components within normal limits  CBG MONITORING, ED - Abnormal; Notable for the following components:   Glucose-Capillary 130 (*)    All other components within normal limits  LIPASE, BLOOD  URINALYSIS, ROUTINE W REFLEX MICROSCOPIC  PREGNANCY, URINE    EKG None  Radiology No results found.  Procedures Procedures    Medications Ordered in ED Medications  sodium chloride 0.9 % bolus 1,000 mL (1,000 mLs Intravenous New Bag/Given 02/23/23 1957)    ED Course/ Medical Decision Making/ A&P                             Medical Decision Making Amount and/or Complexity of Data Reviewed Labs: ordered.  This patient is a 32 y.o. female  who presents to the ED for concern of diarrhea.   Differential diagnoses prior to evaluation: The emergent differential diagnosis includes, but is not limited to,  Infectious causes such as: Viral, Bacterial, Parasitic; Toxin. Noninfectious causes such as: GI Bleed, Appendicitis, Mesenteric Ischemia, Diverticulitis, endocrine causes (adrenal, thyroid), Toxicologic exposures, Drug-associated, IBD. This is not an exhaustive differential.   Past Medical History / Co-morbidities: hypertension, anxiety, sleep apnea, depression, diabetes, and asthma  Additional history: Chart reviewed. Pertinent results include: Reviewed previous labs from 2/28 significant for ALT 197, AST 70, creatinine 1.1.  Physical Exam: Physical exam performed. The pertinent findings include: Mildly hypertensive, otherwise normal vital signs.  Afebrile.  Abdomen  soft and nontender.  Lab Tests/Imaging studies: I personally interpreted labs/imaging and the pertinent results include: No leukocytosis, normal hemoglobin.  CMP with mild elevation in creatinine compared to prior, slight improvement in liver function testing.  Urinalysis negative for hematuria or infection.  Negative pregnancy.  Normal lipase..   As lab work is reassuring and patient has overall nonsurgical abdominal exam, will defer imaging at this time.  Medications: I ordered medication including IV fluids.  I have reviewed the patients home medicines and have made adjustments as needed.  Upon reevaluation patient states that she feels somewhat improved, does not feels "drained".   Disposition: After consideration of the diagnostic results and the patients response to treatment,  I feel that emergency department workup does not suggest an emergent condition requiring admission or immediate intervention beyond what has been performed at this time. The plan is: Discharged home with recommendation for OTC medicine the setting of diarrhea.  Low concern for acute bacterial infection.  Suspect symptoms could be related to recent antibiotic usage, however of low concern for invasive infection such as C. difficile.  Symptoms also could be related to recent dosage change of Ozempic.  Patient plans to follow-up with her PCP about this.. The patient is safe for discharge and has been instructed to return immediately for worsening symptoms, change in symptoms or any other concerns.  Final Clinical Impression(s) / ED Diagnoses Final diagnoses:  Diarrhea, unspecified type    Rx / DC Orders ED Discharge Orders     None      Portions of this report may have been transcribed using voice recognition software. Every effort was made to ensure accuracy; however, inadvertent computerized transcription errors may be present.    Estill Cotta 02/23/23 2137    Margette Fast, MD 02/25/23  1046

## 2023-02-23 NOTE — ED Notes (Signed)
Pt. Reports she has had diarrhea for several days since she started juicing.  Pt. Reports she was juicing with leafy greens and this started diarrhea and she has not stopped with the diarrhea.  Pt. In no distress.  Pt. Has no nausea or vomiting.

## 2023-02-23 NOTE — ED Triage Notes (Signed)
Diarrhea since Sunday feeling weak

## 2023-02-23 NOTE — Discharge Instructions (Signed)
You were seen in the emergency department today for ongoing diarrhea and weakness.  As we discussed your lab work was overall reassuring.  Your liver function testing is slightly improved compared to a month ago, but overall your numbers are elevated.  I recommend following up with your primary doctor about this.  Not sure if your symptoms could be related to your recent antibiotic usage, changing your Ozempic dosing, or may be a viral infection.  I recommend taking over-the-counter medications for diarrhea such as Loperamide or Pepto-Bismol.  Continue to monitor how you're doing and return to the ER for new or worsening symptoms.

## 2023-03-01 ENCOUNTER — Encounter: Payer: Self-pay | Admitting: Internal Medicine

## 2023-03-07 ENCOUNTER — Encounter: Payer: Self-pay | Admitting: Internal Medicine

## 2023-03-08 ENCOUNTER — Other Ambulatory Visit: Payer: Self-pay

## 2023-03-08 MED ORDER — OZEMPIC (2 MG/DOSE) 8 MG/3ML ~~LOC~~ SOPN: PEN_INJECTOR | SUBCUTANEOUS | 1 refills | Status: DC

## 2023-03-11 ENCOUNTER — Other Ambulatory Visit: Payer: Self-pay

## 2023-03-11 ENCOUNTER — Emergency Department (HOSPITAL_BASED_OUTPATIENT_CLINIC_OR_DEPARTMENT_OTHER)
Admission: EM | Admit: 2023-03-11 | Discharge: 2023-03-11 | Disposition: A | Discharge status: Home / Self Care | Payer: BC Managed Care – PPO | Attending: Emergency Medicine | Admitting: Emergency Medicine

## 2023-03-11 ENCOUNTER — Encounter (HOSPITAL_BASED_OUTPATIENT_CLINIC_OR_DEPARTMENT_OTHER): Payer: Self-pay | Admitting: Emergency Medicine

## 2023-03-11 DIAGNOSIS — E669 Obesity, unspecified: Secondary | ICD-10-CM | POA: Insufficient documentation

## 2023-03-11 DIAGNOSIS — R051 Acute cough: Secondary | ICD-10-CM | POA: Insufficient documentation

## 2023-03-11 DIAGNOSIS — Z6841 Body Mass Index (BMI) 40.0 and over, adult: Secondary | ICD-10-CM | POA: Insufficient documentation

## 2023-03-11 DIAGNOSIS — E1165 Type 2 diabetes mellitus with hyperglycemia: Secondary | ICD-10-CM | POA: Insufficient documentation

## 2023-03-11 DIAGNOSIS — I10 Essential (primary) hypertension: Secondary | ICD-10-CM | POA: Diagnosis present

## 2023-03-11 DIAGNOSIS — R739 Hyperglycemia, unspecified: Secondary | ICD-10-CM

## 2023-03-11 LAB — URINALYSIS, ROUTINE W REFLEX MICROSCOPIC
Bilirubin Urine: NEGATIVE
Glucose, UA: 100 mg/dL — AB
Ketones, ur: NEGATIVE mg/dL
Leukocytes,Ua: NEGATIVE
Nitrite: NEGATIVE
Protein, ur: 30 mg/dL — AB
Specific Gravity, Urine: 1.02 (ref 1.005–1.030)
pH: 7 (ref 5.0–8.0)

## 2023-03-11 LAB — I-STAT VENOUS BLOOD GAS, ED
Acid-Base Excess: 1 mmol/L (ref 0.0–2.0)
Bicarbonate: 27.4 mmol/L (ref 20.0–28.0)
Calcium, Ion: 1.27 mmol/L (ref 1.15–1.40)
HCT: 43 % (ref 36.0–46.0)
Hemoglobin: 14.6 g/dL (ref 12.0–15.0)
O2 Saturation: 68 %
Potassium: 4.8 mmol/L (ref 3.5–5.1)
Sodium: 140 mmol/L (ref 135–145)
TCO2: 29 mmol/L (ref 22–32)
pCO2, Ven: 48.7 mmHg (ref 44–60)
pH, Ven: 7.359 (ref 7.25–7.43)
pO2, Ven: 38 mmHg (ref 32–45)

## 2023-03-11 LAB — CBC
HCT: 39.5 % (ref 36.0–46.0)
Hemoglobin: 13.3 g/dL (ref 12.0–15.0)
MCH: 26.2 pg (ref 26.0–34.0)
MCHC: 33.7 g/dL (ref 30.0–36.0)
MCV: 77.9 fL — ABNORMAL LOW (ref 80.0–100.0)
Platelets: 173 10*3/uL (ref 150–400)
RBC: 5.07 MIL/uL (ref 3.87–5.11)
RDW: 15.9 % — ABNORMAL HIGH (ref 11.5–15.5)
WBC: 4.5 10*3/uL (ref 4.0–10.5)
nRBC: 0 % (ref 0.0–0.2)

## 2023-03-11 LAB — BASIC METABOLIC PANEL
Anion gap: 6 (ref 5–15)
BUN: 10 mg/dL (ref 6–20)
CO2: 25 mmol/L (ref 22–32)
Calcium: 8.9 mg/dL (ref 8.9–10.3)
Chloride: 104 mmol/L (ref 98–111)
Creatinine, Ser: 1.2 mg/dL — ABNORMAL HIGH (ref 0.44–1.00)
GFR, Estimated: >60 mL/min (ref >60)
Glucose, Bld: 110 mg/dL — ABNORMAL HIGH (ref 70–99)
Potassium: 4.2 mmol/L (ref 3.5–5.1)
Sodium: 135 mmol/L (ref 135–145)

## 2023-03-11 LAB — URINALYSIS, MICROSCOPIC (REFLEX)

## 2023-03-11 LAB — CBG MONITORING, ED: Glucose-Capillary: 162 mg/dL — ABNORMAL HIGH (ref 70–99)

## 2023-03-11 LAB — PREGNANCY, URINE: Preg Test, Ur: NEGATIVE

## 2023-03-11 MED ORDER — BENZONATATE 100 MG PO CAPS: 100.0000 mg | ORAL_CAPSULE | Freq: Three times a day (TID) | ORAL | 0 refills | Status: AC

## 2023-03-11 NOTE — ED Provider Notes (Signed)
[place] [facility] Provider Note   CSN: [id number] Arrival date & time: 03/11/23  1417     History  Chief Complaint  Patient presents with   Hyperglycemia    Kimberly Heath is a 32 y.o. female.  Patient complains of high blood pressure and hyperglycemia.  She states that she has been concerned because over the past week her blood sugars have been higher than normal and she has also noticed higher than normal blood pressures.  Her glucose at home was reported to be 252 at 2 PM today.  Upon arrival CBG was 162.  She does endorse viral upper respiratory symptoms and has been evaluated as an outpatient with negative COVID and flu testing.  She denies steroid usage or new medication usage.  She takes Ozempic for her diabetes.  She takes spironolactone and carvedilol for blood pressure.  She has not missed any medication dosages.  Patient denies chest pain, shortness of breath, abdominal pain, nausea, vomiting, urinary symptoms, vaginal discharge.  Past medical history significant for obesity, pulmonary hypertension, sickle cell trait, type II DM, hypertension, mild intermittent asthma  HPI     Home Medications Prior to Admission medications   Medication Sig Start Date End Date Taking? Authorizing Provider  benzonatate (TESSALON) 100 MG capsule Take 1 capsule (100 mg total) by mouth every 8 (eight) hours. 03/11/23  Yes Darrick Grinder, PA-C  albuterol (VENTOLIN HFA) 108 (90 Base) MCG/ACT inhaler Inhale 2 puffs into the lungs every 6 (six) hours as needed for wheezing or shortness of breath. 07/27/22   Dorothyann Peng, MD  budesonide-formoterol [facility]) 80-4.5 MCG/ACT inhaler INHALE 2 PUFFS INTO THE LUNGS IN THE MORNING AND AT BEDTIME 02/06/23   Glenford Bayley, NP  carvedilol (COREG) 25 MG tablet TAKE 1 TABLET(25 MG) BY MOUTH TWICE DAILY WITH A MEAL 02/23/23   Dorothyann Peng, MD  Continuous Blood Gluc Receiver (FREESTYLE LIBRE 3 READER) DEVI 1  applicator by Does not apply route as directed. 02/06/23   Dorothyann Peng, MD  Continuous Blood Gluc Sensor (FREESTYLE LIBRE 3 SENSOR) MISC USE AS DIRECTED TO CHECK BLOOD SUGAR. 02/06/23   Dorothyann Peng, MD  famotidine (PEPCID) 20 MG tablet TAKE 1 TABLET(20 MG) BY MOUTH DAILY 10/10/22   Dorothyann Peng, MD  minocycline (DYNACIN) 50 MG tablet Take 1 tablet (50 mg total) by mouth 2 (two) times daily. 07/31/22   Dorothyann Peng, MD  montelukast (SINGULAIR) 10 MG tablet Take 1 tablet (10 mg total) by mouth daily. 07/27/22 07/27/23  Dorothyann Peng, MD  naproxen (NAPROSYN) 375 MG tablet Take 1 tablet twice daily as needed for chest wall pain. 01/10/23   Molpus, John, MD  norethindrone (AYGESTIN) 5 MG tablet TAKE 2 TABLETS(10 MG) BY MOUTH EVERY DAY 12/27/22   Dorothyann Peng, MD  Semaglutide, 1 MG/DOSE, 4 MG/3ML SOPN Inject 1 mg into the skin once a week. 02/04/23   Dorothyann Peng, MD  Semaglutide, 2 MG/DOSE, (OZEMPIC, 2 MG/DOSE,) 8 MG/3ML SOPN INJECT  INTO THE SKIN ONCE A WEEK. 03/08/23   Dorothyann Peng, MD      Allergies    Mushroom extract complex    Review of Systems   Review of Systems  Physical Exam Updated Vital Signs BP (!) 159/88   Pulse 88   Temp 98.2 F (36.8 C) (Oral)   Resp 19   Ht  (1.6 m)   Wt 127.1 kg   SpO2 100%   BMI 49.64 kg/m  Physical Exam Vitals  and nursing note reviewed.  Constitutional:      General: She is not in acute distress.    Appearance: She is well-developed. She is obese.  HENT:     Head: Normocephalic and atraumatic.     Mouth/Throat:     Mouth: Mucous membranes are moist.  Eyes:     Conjunctiva/sclera: Conjunctivae normal.  Cardiovascular:     Rate and Rhythm: Normal rate and regular rhythm.     Heart sounds: No murmur heard. Pulmonary:     Effort: Pulmonary effort is normal. No respiratory distress.     Breath sounds: Normal breath sounds. No wheezing, rhonchi or rales.  Abdominal:     Palpations: Abdomen is soft.     Tenderness: There is no  abdominal tenderness.  Musculoskeletal:        General: No swelling.     Cervical back: Neck supple.     Right lower leg: No edema.     Left lower leg: No edema.  Skin:    General: Skin is warm and dry.     Capillary Refill: Capillary refill takes less than 2 seconds.  Neurological:     Mental Status: She is alert.  Psychiatric:        Mood and Affect: Mood normal.     ED Results / Procedures / Treatments   Labs (all labs ordered are listed, but only abnormal results are displayed) Labs Reviewed  BASIC METABOLIC PANEL - Abnormal; Notable for the following components:      Result Value   Glucose, Bld 110 (*)    Creatinine, Ser 1.20 (*)    All other components within normal limits  CBC - Abnormal; Notable for the following components:   MCV 77.9 (*)    RDW 15.9 (*)    All other components within normal limits  URINALYSIS, ROUTINE W REFLEX MICROSCOPIC - Abnormal; Notable for the following components:   Glucose, UA 100 (*)    Hgb urine dipstick TRACE (*)    Protein, ur 30 (*)    All other components within normal limits  URINALYSIS, MICROSCOPIC (REFLEX) - Abnormal; Notable for the following components:   Bacteria, UA MANY (*)    All other components within normal limits  CBG MONITORING, ED - Abnormal; Notable for the following components:   Glucose-Capillary 162 (*)    All other components within normal limits  PREGNANCY, URINE  BLOOD GAS, VENOUS  I-STAT VENOUS BLOOD GAS, ED    EKG None  Radiology No results found.  Procedures Procedures    Medications Ordered in ED Medications - No data to display  ED Course/ Medical Decision Making/ A&P                             Medical Decision Making Amount and/or Complexity of Data Reviewed Labs: ordered.   Patient presents to the emergency department the chief complaint of hyperglycemia and hypertension.    I ordered and reviewed labs.  Pertinent results include glucose 110 on BMP, creatinine 1.2 (at baseline),  unremarkable CBC, grossly unremarkable UA  There is no indication at this time for imaging  Patient was hypertensive upon arrival.  She has continued to take her blood pressure medicines at home.  Most recent blood pressure had normalized to 159/88.  No signs of endorgan damage from hypertensive urgency.  Plan to have patient follow-up with primary care for further recommendations on blood pressure medications and glycemic control  Patient does complain of cough, will prescribe benzonatate         Final Clinical Impression(s) / ED Diagnoses Final diagnoses:  Hyperglycemia  Hypertension, unspecified type  Acute cough    Rx / DC Orders ED Discharge Orders          Ordered    benzonatate (TESSALON) 100 MG capsule  Every 8 hours        03/11/23 1722              Pamala Duffel 03/11/23 1724    Virgina Norfolk, DO 03/11/23 1915

## 2023-03-11 NOTE — Discharge Instructions (Addendum)
You were evaluated today for high blood sugar and high blood pressure.  Your overall lab workup was reassuring.  I have prescribed benzonatate which is a cough medicine.  Please take as directed.  Please follow-up with your primary care provider for further management of your hypertension and hyperglycemia.  If you develop any life-threatening symptoms please return to the emergency department.

## 2023-03-11 NOTE — ED Triage Notes (Addendum)
Patient c/o hypertension and hyperglycemia x 1 week. Blood sugar 252 at 1:57 pm.

## 2023-03-14 ENCOUNTER — Other Ambulatory Visit: Payer: Self-pay | Admitting: Internal Medicine

## 2023-03-16 ENCOUNTER — Ambulatory Visit (INDEPENDENT_AMBULATORY_CARE_PROVIDER_SITE_OTHER): Payer: BC Managed Care – PPO | Admitting: Nurse Practitioner

## 2023-03-16 ENCOUNTER — Telehealth: Payer: Self-pay

## 2023-03-16 ENCOUNTER — Encounter: Payer: Self-pay | Admitting: Nurse Practitioner

## 2023-03-16 VITALS — BP 148/108 | HR 91 | Temp 98.2°F | Ht 63.0 in | Wt 271.4 lb

## 2023-03-16 DIAGNOSIS — J069 Acute upper respiratory infection, unspecified: Secondary | ICD-10-CM | POA: Diagnosis not present

## 2023-03-16 DIAGNOSIS — R051 Acute cough: Secondary | ICD-10-CM

## 2023-03-16 DIAGNOSIS — J4520 Mild intermittent asthma, uncomplicated: Secondary | ICD-10-CM

## 2023-03-16 MED ORDER — CEFTRIAXONE SODIUM 500 MG IJ SOLR
1.0000 g | Freq: Once | INTRAMUSCULAR | Status: AC
Start: 2023-03-16 — End: 2023-03-16
  Administered 2023-03-16: 1 g via INTRAMUSCULAR

## 2023-03-16 MED ORDER — TRIAMCINOLONE ACETONIDE 40 MG/ML IJ SUSP
40.0000 mg | Freq: Once | INTRAMUSCULAR | Status: AC
Start: 2023-03-16 — End: 2023-03-16
  Administered 2023-03-16: 40 mg via INTRAMUSCULAR

## 2023-03-16 MED ORDER — CEFTRIAXONE SODIUM 1 G IJ SOLR
1.0000 g | Freq: Once | INTRAMUSCULAR | Status: DC
Start: 2023-03-16 — End: 2023-03-16

## 2023-03-16 MED ORDER — AZITHROMYCIN 250 MG PO TABS
ORAL_TABLET | ORAL | 0 refills | Status: AC
Start: 2023-03-16 — End: 2023-03-21

## 2023-03-16 NOTE — Progress Notes (Signed)
I,Sheena H Holbrook,acting as a Neurosurgeon for Arnette Felts, FNP.,have documented all relevant documentation on the behalf of Arnette Felts, FNP,as directed by  Arnette Felts, FNP while in the presence of Arnette Felts, FNP.    Subjective:     [name] , female    DOB: [birth year] , 32 y.o.   MRN: [id number]   Chief Complaint  Patient presents with   Nasal Congestion    HPI  Patient presents today for ED follow up from recent visit on 03/11/23. Patient still has sinus issue, coughing, lost of taste and smell. This been going on since 4/6.  She went to [facility] on April 12th. Continues to have coughing, mucous production, dizziness.   She never picked up the tessalon perles. She was afraid to take due to possible side effects. She does have a history of asthma      Past Medical History:  Diagnosis Date   Anxiety    Asthma    Back pain    Chest pain    Depression    Diabetes mellitus without complication (HCC)    Edema, lower extremity    Fluid collection (edema) in the arms, legs, hands and feet    Hypertension    Pericardial effusion    Sleep apnea    SOB (shortness of breath)      Family History  Problem Relation Age of Onset   Healthy Mother    High blood pressure Father    Sickle cell trait Father      Current Outpatient Medications:    albuterol (VENTOLIN HFA) 108 (90 Base) MCG/ACT inhaler, Inhale 2 puffs into the lungs every 6 (six) hours as needed for wheezing or shortness of breath., Disp: 54 g, Rfl: 2   benzonatate (TESSALON) 100 MG capsule, Take 1 capsule (100 mg total) by mouth every 8 (eight) hours., Disp: 21 capsule, Rfl: 0   budesonide-formoterol (SYMBICORT) 80-4.5 MCG/ACT inhaler, INHALE 2 PUFFS INTO THE LUNGS IN THE MORNING AND AT BEDTIME, Disp: 10.2 g, Rfl: 3   carvedilol (COREG) 25 MG tablet, TAKE 1 TABLET(25 MG) BY MOUTH TWICE DAILY WITH A MEAL, Disp: 180 tablet, Rfl: 1   Continuous Blood Gluc Receiver (FREESTYLE LIBRE 3 READER) DEVI,  1 applicator by Does not apply route as directed., Disp: 1 each, Rfl: 1   Continuous Glucose Sensor (FREESTYLE LIBRE 3 SENSOR) MISC, USE AS DIRECTED TO CHECK BLOOD SUGAR. REPLACE EVERY 14 DAYS, Disp: 1 each, Rfl: 1   famotidine (PEPCID) 20 MG tablet, TAKE 1 TABLET(20 MG) BY MOUTH DAILY, Disp: 90 tablet, Rfl: 1   minocycline (DYNACIN) 50 MG tablet, Take 1 tablet (50 mg total) by mouth 2 (two) times daily., Disp: 180 tablet, Rfl: 0   montelukast (SINGULAIR) 10 MG tablet, Take 1 tablet (10 mg total) by mouth daily., Disp: 90 tablet, Rfl: 2   naproxen (NAPROSYN) 375 MG tablet, Take 1 tablet twice daily as needed for chest wall pain., Disp: 20 tablet, Rfl: 0   norethindrone (AYGESTIN) 5 MG tablet, TAKE 2 TABLETS(10 MG) BY MOUTH EVERY DAY, Disp: 180 tablet, Rfl: 0   Semaglutide, 2 MG/DOSE, (OZEMPIC, 2 MG/DOSE,) 8 MG/3ML SOPN, INJECT 2MG  INTO THE SKIN ONCE A WEEK., Disp: 3 mL, Rfl: 1   spironolactone (ALDACTONE) 25 MG tablet, Take 25 mg by mouth daily., Disp: , Rfl:    Allergies  Allergen Reactions   Mushroom Extract Complex Other (See Comments)    Flu like symptoms      Review of  Systems  HENT:  Positive for congestion, sinus pain and sneezing.   Respiratory:  Positive for cough.   Neurological:  Positive for dizziness, weakness and headaches.     Today's Vitals   03/16/23 1044  BP: (!) 148/108  Pulse: 91  Temp: 98.2 F (36.8 C)  TempSrc: Oral  SpO2: 98%  Weight: 271 lb 6.4 oz (123.1 kg)  Height: 5\' 3"  (1.6 m)   Body mass index is 48.08 kg/m.   Objective:  Physical Exam Vitals reviewed.  Constitutional:      General: She is not in acute distress.    Appearance: Normal appearance. She is obese.  Cardiovascular:     Rate and Rhythm: Normal rate and regular rhythm.     Pulses: Normal pulses.     Heart sounds: Normal heart sounds. No murmur heard. Pulmonary:     Effort: Pulmonary effort is normal. No respiratory distress.     Breath sounds: Normal breath sounds. No wheezing.   Neurological:     General: No focal deficit present.     Mental Status: She is alert and oriented to person, place, and time.     Cranial Nerves: No cranial nerve deficit.     Motor: No weakness.         Assessment And Plan:     1. Acute cough Comments: Negative rapid covid. Symptoms have been ongoing for 2 weeks will treat with Rocephin - POC COVID-19 - triamcinolone acetonide (KENALOG-40) injection 40 mg - cefTRIAXone (ROCEPHIN) injection 1 g  2. Mild intermittent asthma without complication Comments: Slight wheezing on auscultation, will treat with prednisone - triamcinolone acetonide (KENALOG-40) injection 40 mg  3. Upper respiratory infection, acute - azithromycin (ZITHROMAX) 250 MG tablet; Take 2 tablets (500 mg) on  Day 1,  followed by 1 tablet (250 mg) once daily on Days 2 through 5.  Dispense: 6 each; Refill: 0 - cefTRIAXone (ROCEPHIN) injection 1 g     Patient was given opportunity to ask questions. Patient verbalized understanding of the plan and was able to repeat key elements of the plan. All questions were answered to their satisfaction.  Arnette Felts, FNP   I, Arnette Felts, FNP, have reviewed all documentation for this visit. The documentation on 03/16/23 for the exam, diagnosis, procedures, and orders are all accurate and complete.   IF YOU HAVE BEEN REFERRED TO A SPECIALIST, IT MAY TAKE 1-2 WEEKS TO SCHEDULE/PROCESS THE REFERRAL. IF YOU HAVE NOT HEARD FROM US/SPECIALIST IN TWO WEEKS, PLEASE GIVE [place] A CALL AT [phone] X [phone].   THE PATIENT IS ENCOURAGED TO PRACTICE SOCIAL DISTANCING DUE TO THE COVID-19 PANDEMIC.

## 2023-03-16 NOTE — Telephone Encounter (Signed)
Transition Care Management Follow-up Telephone Call Date of discharge and from where: 03/11/23 [facility] How have you been since you were released from the hospital? Still not feeling well, lost sense of taste/smell Any questions or concerns? Yes - concerned about liver function testing, ED asked if she had ever seen a liver specialist  Items Reviewed: Did the pt receive and understand the discharge instructions provided? Yes  Medications obtained and verified? Yes  Other? No  Any new allergies since your discharge? No  Dietary orders reviewed? No Do you have support at home? Yes   Home Care and Equipment/Supplies: Were home health services ordered? no If so, what is the name of the agency?   Has the agency set up a time to come to the patient's home? not applicable Were any new equipment or medical supplies ordered?  No What is the name of the medical supply agency?  Were you able to get the supplies/equipment? not applicable Do you have any questions related to the use of the equipment or supplies? No  Functional Questionnaire: (I = Independent and D = Dependent) ADLs: I  Bathing/Dressing- I  Meal Prep- I  Eating- I  Maintaining continence- I  Transferring/Ambulation- I  Managing Meds- I  Follow up appointments reviewed:  PCP Hospital f/u appt confirmed? Yes  Scheduled to see Arnette Felts, DNP on 03/16/23 @ 10:20. Specialist Hospital f/u appt confirmed? No   Are transportation arrangements needed? No  If their condition worsens, is the pt aware to call PCP or go to the Emergency Dept.? Yes Was the patient provided with contact information for the PCP's office or ED? Yes Was to pt encouraged to call back with questions or concerns? Yes

## 2023-03-24 ENCOUNTER — Other Ambulatory Visit: Payer: Self-pay | Admitting: Internal Medicine

## 2023-03-24 DIAGNOSIS — Z0000 Encounter for general adult medical examination without abnormal findings: Secondary | ICD-10-CM

## 2023-03-25 LAB — POC COVID19 BINAXNOW

## 2023-03-30 ENCOUNTER — Other Ambulatory Visit: Payer: Self-pay | Admitting: Internal Medicine

## 2023-03-30 DIAGNOSIS — E1165 Type 2 diabetes mellitus with hyperglycemia: Secondary | ICD-10-CM

## 2023-04-25 ENCOUNTER — Other Ambulatory Visit: Payer: Self-pay

## 2023-04-25 NOTE — Progress Notes (Signed)
   Kimberly Heath 07-15-1991 [id number]  Patient outreached by Seward Meth , PharmD Candidate on 04/25/23.  Pt states she is switching insurance to [place] and concerned about medication costs increasing. Pt also has been having hypoglycemia since switching ozempic from 1 mg to 1.5 mg dose. States her readings go as low as 59 and after a meal they are generally in the low 100's.   Blood Pressure Readings: Last documented ambulatory systolic blood pressure: 148 Last documented ambulatory diastolic blood pressure: 108 Does the patient have a validated home blood pressure machine?: Yes They report home readings Systolic between 132-440, unsure of diastolic   Medication review was performed. Is the patient taking their medications as prescribed?: Yes Differences from their prescribed list include: Taking 1.5 mg of ozempic   The following barriers to adherence were noted: Does the patient have cost concerns?: Yes Does the patient have transportation concerns?: No Does the patient need assistance obtaining refills?: No Does the patient occassionally forget to take some of their prescribed medications?: No Does the patient feel like one/some of their medications make them feel poorly?: Yes Does the patient have questions or concerns about their medications?: Yes Does the patient have a follow up scheduled with their primary care provider/cardiologist?: Yes   Interventions: Interventions Completed: Medications were reviewed, Patient was educated on proper technique to check home blood pressure and reminded to bring home machine and readings to next provider appointment  The patient has follow up scheduled:  PCP: Dorothyann Peng, MD   Seward Meth, Student-PharmD

## 2023-04-26 ENCOUNTER — Ambulatory Visit (INDEPENDENT_AMBULATORY_CARE_PROVIDER_SITE_OTHER): Payer: BC Managed Care – PPO | Admitting: Internal Medicine

## 2023-04-26 ENCOUNTER — Other Ambulatory Visit: Payer: Self-pay

## 2023-04-26 ENCOUNTER — Encounter: Payer: Self-pay | Admitting: Internal Medicine

## 2023-04-26 VITALS — BP 148/100 | HR 94 | Temp 98.2°F | Ht 63.0 in | Wt 271.0 lb

## 2023-04-26 DIAGNOSIS — E66813 Obesity, class 3: Secondary | ICD-10-CM

## 2023-04-26 DIAGNOSIS — I10 Essential (primary) hypertension: Secondary | ICD-10-CM

## 2023-04-26 DIAGNOSIS — J984 Other disorders of lung: Secondary | ICD-10-CM

## 2023-04-26 DIAGNOSIS — J4520 Mild intermittent asthma, uncomplicated: Secondary | ICD-10-CM

## 2023-04-26 DIAGNOSIS — Z6841 Body Mass Index (BMI) 40.0 and over, adult: Secondary | ICD-10-CM

## 2023-04-26 DIAGNOSIS — D573 Sickle-cell trait: Secondary | ICD-10-CM

## 2023-04-26 DIAGNOSIS — R0602 Shortness of breath: Secondary | ICD-10-CM

## 2023-04-26 DIAGNOSIS — E1165 Type 2 diabetes mellitus with hyperglycemia: Secondary | ICD-10-CM | POA: Diagnosis not present

## 2023-04-26 DIAGNOSIS — G4733 Obstructive sleep apnea (adult) (pediatric): Secondary | ICD-10-CM | POA: Diagnosis not present

## 2023-04-26 MED ORDER — BUDESONIDE-FORMOTEROL FUMARATE 80-4.5 MCG/ACT IN AERO
2.0000 | INHALATION_SPRAY | Freq: Two times a day (BID) | RESPIRATORY_TRACT | 3 refills | Status: DC
Start: 2023-04-26 — End: 2024-04-10

## 2023-04-26 NOTE — Progress Notes (Signed)
I,[name],acting as a scribe for [name], MD.,have documented all relevant documentation on the behalf of [name], MD,as directed by  [name], MD while in the presence of [name], MD.    Subjective:     [name] , female    DOB: [birth year] , 32 y.o.   MRN: [id number]   Chief Complaint  Patient presents with   Diabetes   Hypertension    HPI  Patient presents today for diabetes & blood pressure check. Patient reports compliance with medications. Denies headache, chest pain, and SOB.  She reports taking 1.5mg , Ozempic(54 clicks of 2mg  pen).   She notices her readings have been lower than usual since second injection on Saturday. Lowest reading: 53.  She admits that she has not been eating regularly; also, admits to not eating full meals.    Hypertension This is a chronic problem. The current episode started more than 1 year ago. The problem has been gradually improving since onset. The problem is controlled. Pertinent negatives include no blurred vision, chest pain, headaches or neck pain. Risk factors for coronary artery disease include obesity. The current treatment provides moderate improvement.     Past Medical History:  Diagnosis Date   Anxiety    Asthma    Back pain    Chest pain    Depression    Diabetes mellitus without complication (HCC)    Edema, lower extremity    Fluid collection (edema) in the arms, legs, hands and feet    Hypertension    Pericardial effusion    Sleep apnea    SOB (shortness of breath)      Family History  Problem Relation Age of Onset   Healthy Mother    High blood pressure Father    Sickle cell trait Father      Current Outpatient Medications:    albuterol (VENTOLIN HFA) 108 (90 Base) MCG/ACT inhaler, Inhale 2 puffs into the lungs every 6 (six) hours as needed for wheezing or shortness of breath., Disp: 54 g, Rfl: 2   carvedilol (COREG) 25 MG tablet, TAKE 1 TABLET(25 MG)  BY MOUTH TWICE DAILY WITH A MEAL, Disp: 180 tablet, Rfl: 1   Continuous Blood Gluc Receiver (FREESTYLE LIBRE 3 READER) DEVI, 1 applicator by Does not apply route as directed., Disp: 1 each, Rfl: 1   famotidine (PEPCID) 20 MG tablet, TAKE 1 TABLET(20 MG) BY MOUTH DAILY, Disp: 90 tablet, Rfl: 1   montelukast (SINGULAIR) 10 MG tablet, Take 1 tablet (10 mg total) by mouth daily., Disp: 90 tablet, Rfl: 2   naproxen (NAPROSYN) 375 MG tablet, Take 1 tablet twice daily as needed for chest wall pain., Disp: 20 tablet, Rfl: 0   norethindrone (AYGESTIN) 5 MG tablet, TAKE 2 TABLETS(10 MG) BY MOUTH EVERY DAY, Disp: 180 tablet, Rfl: 0   Semaglutide, 2 MG/DOSE, (OZEMPIC, 2 MG/DOSE,) 8 MG/3ML SOPN, INJECT 2MG  INTO THE SKIN ONCE A WEEK., Disp: 3 mL, Rfl: 1   spironolactone (ALDACTONE) 25 MG tablet, Take 25 mg by mouth daily., Disp: , Rfl:    benzonatate (TESSALON) 100 MG capsule, Take 1 capsule (100 mg total) by mouth every 8 (eight) hours. (Patient not taking: Reported on 04/25/2023), Disp: 21 capsule, Rfl: 0   budesonide-formoterol (SYMBICORT) 80-4.5 MCG/ACT inhaler, Inhale 2 puffs into the lungs in the morning and at bedtime., Disp: 10.2 g, Rfl: 3   Continuous Glucose Sensor (FREESTYLE LIBRE 3 SENSOR) MISC, USE AS DIRECTED TO  CHECK BLOOD SUGAR, REPLACE EVERY 14 DAYS, Disp: 1 each, Rfl: 1   minocycline (DYNACIN) 50 MG tablet, Take 1 tablet (50 mg total) by mouth 2 (two) times daily. (Patient not taking: Reported on 04/25/2023), Disp: 180 tablet, Rfl: 0   Allergies  Allergen Reactions   Mushroom Extract Complex Other (See Comments)    Flu like symptoms      Review of Systems  Constitutional: Negative.   Eyes:  Negative for blurred vision.  Respiratory: Negative.    Cardiovascular: Negative.  Negative for chest pain.  Musculoskeletal:  Negative for neck pain.  Neurological: Negative.  Negative for headaches.  Psychiatric/Behavioral: Negative.       Today's Vitals   04/26/23 1605 04/26/23 1618  BP: (!)  150/100 (!) 148/100  Pulse: 94   Temp: 98.2 F (36.8 C)   SpO2: 98%   Weight: 271 lb (122.9 kg)   Height: 5\' 3"  (1.6 m)    Wt Readings from Last 3 Encounters:  04/26/23 271 lb (122.9 kg)  03/16/23 271 lb 6.4 oz (123.1 kg)  03/11/23 280 lb 3.3 oz (127.1 kg)    Body mass index is 48.01 kg/m.  The ASCVD Risk score (Arnett DK, et al., 2019) failed to calculate for the following reasons:   The 2019 ASCVD risk score is only valid for ages 81 to 7  Objective:  Physical Exam Vitals and nursing note reviewed.  Constitutional:      Appearance: Normal appearance. She is obese.  HENT:     Head: Normocephalic and atraumatic.  Eyes:     Extraocular Movements: Extraocular movements intact.  Cardiovascular:     Rate and Rhythm: Normal rate and regular rhythm.     Heart sounds: Normal heart sounds.  Pulmonary:     Effort: Pulmonary effort is normal.     Breath sounds: Normal breath sounds.  Musculoskeletal:     Cervical back: Normal range of motion.  Skin:    General: Skin is warm.  Neurological:     General: No focal deficit present.     Mental Status: She is alert.  Psychiatric:        Mood and Affect: Mood normal.        Behavior: Behavior normal.         Assessment And Plan:     1. Type 2 diabetes mellitus with hyperglycemia, without long-term current use of insulin (HCC) Comments: Chronic, importance of intentional protein intake and dietary adherence was d/w patient. She agrees to Nutrition evaluation.  She will f/u 3 months. - Hemoglobin A1c - Ambulatory referral to diabetic education - BMP8+eGFR  2. Essential hypertension, benign Comments: Chronic, uncontrolled. She will c/w spironolactone 25mg , carvedilol 25mg  twice daily and  3. Mild intermittent asthma without complication Comments: Chronic, sx are stable w/ Symbicort MDI.  4. OSA on CPAP Comments: Chronic, importance of CPAP compliance was d/w patient. Advised to aim for at least four hours per night.  5.  Sickle cell trait (HCC) Comments: Chronic, encouraged to stay well hydrated.  6. Class 3 severe obesity due to excess calories with serious comorbidity and body mass index (BMI) of 45.0 to 49.9 in adult (HCC) BMI 48.  She is encouraged to initially strive for BMI less than 40 to decrease cardiac risk. Advised to aim for at least 150 minutes of exercise per week.    Return if symptoms worsen or fail to improve.  Patient was given opportunity to ask questions. Patient verbalized understanding of the plan and  was able to repeat key elements of the plan. All questions were answered to their satisfaction.   I, [name], MD, have reviewed all documentation for this visit. The documentation on 04/26/23 for the exam, diagnosis, procedures, and orders are all accurate and complete.   IF YOU HAVE BEEN REFERRED TO A SPECIALIST, IT MAY TAKE 1-2 WEEKS TO SCHEDULE/PROCESS THE REFERRAL. IF YOU HAVE NOT HEARD FROM US/SPECIALIST IN TWO WEEKS, PLEASE GIVE [place] A CALL AT [phone] X [phone].   THE PATIENT IS ENCOURAGED TO PRACTICE SOCIAL DISTANCING DUE TO THE COVID-19 PANDEMIC.

## 2023-04-26 NOTE — Patient Instructions (Addendum)
Diabetes Mellitus and Nutrition, Adult When you have diabetes, or diabetes mellitus, it is very important to have healthy eating habits because your blood sugar (glucose) levels are greatly affected by what you eat and drink. Eating healthy foods in the right amounts, at about the same times every day, can help you: Manage your blood glucose. Lower your risk of heart disease. Improve your blood pressure. Reach or maintain a healthy weight. What can affect my meal plan? Every person with diabetes is different, and each person has different needs for a meal plan. Your health care provider may recommend that you work with a dietitian to make a meal plan that is best for you. Your meal plan may vary depending on factors such as: The calories you need. The medicines you take. Your weight. Your blood glucose, blood pressure, and cholesterol levels. Your activity level. Other health conditions you have, such as heart or kidney disease. How do carbohydrates affect me? Carbohydrates, also called carbs, affect your blood glucose level more than any other type of food. Eating carbs raises the amount of glucose in your blood. It is important to know how many carbs you can safely have in each meal. This is different for every person. Your dietitian can help you calculate how many carbs you should have at each meal and for each snack. How does alcohol affect me? Alcohol can cause a decrease in blood glucose (hypoglycemia), especially if you use insulin or take certain diabetes medicines by mouth. Hypoglycemia can be a life-threatening condition. Symptoms of hypoglycemia, such as sleepiness, dizziness, and confusion, are similar to symptoms of having too much alcohol. Do not drink alcohol if: Your health care provider tells you not to drink. You are pregnant, may be pregnant, or are planning to become pregnant. If you drink alcohol: Limit how much you have to: 0-1 drink a day for women. 0-2 drinks a day  for men. Know how much alcohol is in your drink. In the [place] one drink equals one 12 oz bottle of beer (355 mL), one 5 oz glass of wine (148 mL), or one 1 oz glass of hard liquor (44 mL). Keep yourself hydrated with water, diet soda, or unsweetened iced tea. Keep in mind that regular soda, juice, and other mixers may contain a lot of sugar and must be counted as carbs. What are tips for following this plan?  Reading food labels Start by checking the serving size on the Nutrition Facts label of packaged foods and drinks. The number of calories and the amount of carbs, fats, and other nutrients listed on the label are based on one serving of the item. Many items contain more than one serving per package. Check the total grams (g) of carbs in one serving. Check the number of grams of saturated fats and trans fats in one serving. Choose foods that have a low amount or none of these fats. Check the number of milligrams (mg) of salt (sodium) in one serving. Most people should limit total sodium intake to less than 2,300 mg per day. Always check the nutrition information of foods labeled as "low-fat" or "nonfat." These foods may be higher in added sugar or refined carbs and should be avoided. Talk to your dietitian to identify your daily goals for nutrients listed on the label. Shopping Avoid buying canned, pre-made, or processed foods. These foods tend to be high in fat, sodium, and added sugar. Shop around the outside edge of the grocery store. This is where you  will most often find fresh fruits and vegetables, bulk grains, fresh meats, and fresh dairy products. Cooking Use low-heat cooking methods, such as baking, instead of high-heat cooking methods, such as deep frying. Cook using healthy oils, such as olive, canola, or sunflower oil. Avoid cooking with butter, cream, or high-fat meats. Meal planning Eat meals and snacks regularly, preferably at the same times every day. Avoid going long periods  of time without eating. Eat foods that are high in fiber, such as fresh fruits, vegetables, beans, and whole grains. Eat 4-6 oz (112-168 g) of lean protein each day, such as lean meat, chicken, fish, eggs, or tofu. One ounce (oz) (28 g) of lean protein is equal to: 1 oz (28 g) of meat, chicken, or fish. 1 egg.  cup (62 g) of tofu. Eat some foods each day that contain healthy fats, such as avocado, nuts, seeds, and fish. What foods should I eat? Fruits Berries. Apples. Oranges. Peaches. Apricots. Plums. Grapes. Mangoes. Papayas. Pomegranates. Kiwi. Cherries. Vegetables Leafy greens, including lettuce, spinach, kale, chard, collard greens, mustard greens, and cabbage. Beets. Cauliflower. Broccoli. Carrots. Green beans. Tomatoes. Peppers. Onions. Cucumbers. Brussels sprouts. Grains Whole grains, such as whole-wheat or whole-grain bread, crackers, tortillas, cereal, and pasta. Unsweetened oatmeal. Quinoa. Brown or wild rice. Meats and other proteins Seafood. Poultry without skin. Lean cuts of poultry and beef. Tofu. Nuts. Seeds. Dairy Low-fat or fat-free dairy products such as milk, yogurt, and cheese. The items listed above may not be a complete list of foods and beverages you can eat and drink. Contact a dietitian for more information. What foods should I avoid? Fruits Fruits canned with syrup. Vegetables Canned vegetables. Frozen vegetables with butter or cream sauce. Grains Refined white flour and flour products such as bread, pasta, snack foods, and cereals. Avoid all processed foods. Meats and other proteins Fatty cuts of meat. Poultry with skin. Breaded or fried meats. Processed meat. Avoid saturated fats. Dairy Full-fat yogurt, cheese, or milk. Beverages Sweetened drinks, such as soda or iced tea. The items listed above may not be a complete list of foods and beverages you should avoid. Contact a dietitian for more information. Questions to ask a health care provider Do I need  to meet with a certified diabetes care and education specialist? Do I need to meet with a dietitian? What number can I call if I have questions? When are the best times to check my blood glucose? Where to find more information: American Diabetes Association: diabetes.org Academy of Nutrition and Dietetics: eatright.[employer] of Diabetes and Digestive and Kidney Diseases: StageSync.si Association of Diabetes Care & Education Specialists: diabeteseducator.org Summary It is important to have healthy eating habits because your blood sugar (glucose) levels are greatly affected by what you eat and drink. It is important to use alcohol carefully. A healthy meal plan will help you manage your blood glucose and lower your risk of heart disease. Your health care provider may recommend that you work with a dietitian to make a meal plan that is best for you. This information is not intended to replace advice given to you by your health care provider. Make sure you discuss any questions you have with your health care provider. Document Revised: 06/17/2020 Document Reviewed: 06/17/2020 Elsevier Patient Education  2024 Elsevier Inc.   Type 2 Diabetes Mellitus, Diagnosis, Adult Type 2 diabetes (type 2 diabetes mellitus) is a long-term (chronic) disease. It may happen when there is one or both of these problems: The pancreas does not  make enough insulin. The body does not react in a normal way to insulin that it makes. Insulin lets sugars go into cells in your body. If you have type 2 diabetes, sugars cannot get into your cells. Sugars build up in the blood. This causes high blood sugar. What are the causes? The exact cause of this condition is not known. What increases the risk? Having type 2 diabetes in your family. Being overweight or very overweight. Not being active. Your body not reacting in a normal way to the insulin it makes. Having higher than normal blood sugar over  time. Having a type of diabetes when you were pregnant. Having a condition that causes small fluid-filled sacs on your ovaries. What are the signs or symptoms? At first, you may have no symptoms. You will get symptoms slowly. They may include: More thirst than normal. More hunger than normal. Needing to pee more than normal. Losing weight without trying. Feeling tired. Feeling weak. Seeing things blurry. Dark patches on your skin. How is this treated? This condition may be treated by a diabetes expert. You may need to: Follow an eating plan made by a food expert (dietitian). Get regular exercise. Find ways to deal with stress. Check blood sugar as often as told. Take medicines. Your doctor will set treatment goals for you. Your blood sugar should be at these levels: Before meals: 80-130 mg/dL (6.0-6.3 mmol/L). After meals: below 180 mg/dL (10 mmol/L). Over the last 2-3 months: less than 7%. Follow these instructions at home: Medicines Take your diabetes medicines or insulin every day. Take medicines as told to help you prevent other problems caused by this condition. You may need: Aspirin. Medicine to lower cholesterol. Medicine to control blood pressure. Questions to ask your doctor Should I meet with a diabetes educator? What medicines do I need, and when should I take them? What will I need to treat my condition at home? When should I check my blood sugar? Where can I find a support group? Who can I call if I have questions? When is my next doctor visit? General instructions Take over-the-counter and prescription medicines only as told by your doctor. Keep all follow-up visits. Where to find more information For help and guidance and more information about diabetes, please go to: American Diabetes Association (ADA): www.diabetes.org American Association of Diabetes Care and Education Specialists (ADCES): www.diabeteseducator.org International Diabetes Federation  (IDF): DCOnly.dk Contact a doctor if: Your blood sugar is at or above 240 mg/dL (01.6 mmol/L) for 2 days in a row. You have been sick for 2 days or more, and you are not getting better. You have had a fever for 2 days or more, and you are not getting better. You have any of these problems for more than 6 hours: You cannot eat or drink. You feel like you may vomit. You vomit. You have watery poop (diarrhea). Get help right away if: Your blood sugar is lower than 54 mg/dL (3 mmol/L). You feel mixed up (confused). You have trouble thinking clearly. You have trouble breathing. You have medium or large ketone levels in your pee. These symptoms may be an emergency. Get help right away. Call your local emergency services (911 in the [place] Do not wait to see if the symptoms will go away. Do not drive yourself to the hospital. Summary Type 2 diabetes is a long-term disease. Your pancreas may not make enough insulin, or your body may not react in a normal way to insulin that it makes.  This condition is treated with an eating plan, lifestyle changes, and medicines. Your doctor will set treatment goals for you. These will help you keep your blood sugar in a healthy range. Keep all follow-up visits. This information is not intended to replace advice given to you by your health care provider. Make sure you discuss any questions you have with your health care provider. Document Revised: 02/08/2021 Document Reviewed: 02/08/2021 Elsevier Patient Education  2024 [employer].

## 2023-04-27 ENCOUNTER — Encounter: Payer: Self-pay | Admitting: Internal Medicine

## 2023-04-27 ENCOUNTER — Other Ambulatory Visit: Payer: Self-pay | Admitting: Internal Medicine

## 2023-04-27 LAB — BMP8+EGFR
BUN/Creatinine Ratio: 10 (ref 9–23)
BUN: 11 mg/dL (ref 6–20)
CO2: 21 mmol/L (ref 20–29)
Calcium: 9.7 mg/dL (ref 8.7–10.2)
Chloride: 106 mmol/L (ref 96–106)
Creatinine, Ser: 1.14 mg/dL — ABNORMAL HIGH (ref 0.57–1.00)
Glucose: 139 mg/dL — ABNORMAL HIGH (ref 70–99)
Potassium: 4.7 mmol/L (ref 3.5–5.2)
Sodium: 140 mmol/L (ref 134–144)
eGFR: 66 mL/min/{1.73_m2} (ref >59)

## 2023-04-27 LAB — HEMOGLOBIN A1C
Est. average glucose Bld gHb Est-mCnc: 128 mg/dL
Hgb A1c MFr Bld: 6.1 % — ABNORMAL HIGH (ref 4.8–5.6)

## 2023-05-07 ENCOUNTER — Encounter: Payer: Self-pay | Admitting: Internal Medicine

## 2023-05-31 ENCOUNTER — Ambulatory Visit: Payer: BC Managed Care – PPO | Admitting: Dietician

## 2023-06-05 ENCOUNTER — Other Ambulatory Visit: Payer: Self-pay

## 2023-06-05 ENCOUNTER — Encounter: Payer: Self-pay | Admitting: Internal Medicine

## 2023-06-05 DIAGNOSIS — E1165 Type 2 diabetes mellitus with hyperglycemia: Secondary | ICD-10-CM

## 2023-06-05 MED ORDER — FREESTYLE LIBRE 3 SENSOR MISC
3 refills | Status: DC
Start: 2023-06-05 — End: 2023-09-28

## 2023-06-26 ENCOUNTER — Other Ambulatory Visit: Payer: Self-pay | Admitting: Internal Medicine

## 2023-07-03 ENCOUNTER — Encounter: Payer: Self-pay | Admitting: Internal Medicine

## 2023-07-18 ENCOUNTER — Ambulatory Visit: Payer: BC Managed Care – PPO | Admitting: Dietician

## 2023-07-25 ENCOUNTER — Telehealth: Payer: Self-pay

## 2023-07-25 ENCOUNTER — Other Ambulatory Visit: Payer: Self-pay

## 2023-07-25 NOTE — Telephone Encounter (Signed)
*  [facility]  Pharmacy Patient Advocate Encounter  Received notification from [employer] that Prior Authorization for Budesonide-Formoterol Fumarate 80-4.5MCG/ACT aerosol  has been APPROVED from 07/25/2023 to 07/24/2024. Ran test claim, Copay is $0.00. This test claim was processed through [facility]- copay amounts may vary at other pharmacies due to pharmacy/plan contracts, or as the patient moves through the different stages of their insurance plan.   PA #/Case ID/Reference #: [id number]

## 2023-07-27 ENCOUNTER — Encounter: Payer: Managed Care, Other (non HMO) | Attending: Internal Medicine | Admitting: Dietician

## 2023-07-27 ENCOUNTER — Encounter: Payer: Self-pay | Admitting: Dietician

## 2023-07-27 VITALS — Wt 267.0 lb

## 2023-07-27 DIAGNOSIS — E119 Type 2 diabetes mellitus without complications: Secondary | ICD-10-CM | POA: Insufficient documentation

## 2023-07-27 NOTE — Patient Instructions (Signed)
Goal: go walking 2 times per week for 30 minutes, or do a youtube workout at home.   Goal: eat at least 3 times per day.  - When snacking, aim to include a complex carb and protein. - At meals, aim to include 1/2 plate non-starchy vegetables, 1/4 plate protein, and 1/4 plate complex carbs.

## 2023-07-27 NOTE — Progress Notes (Signed)
Diabetes Self-Management Education  Visit Type: First/Initial  Appt. Start Time: 1140 Appt. End Time: 1215  07/27/2023  [name], identified by name and date of birth, is a 32 y.o. female with a diagnosis of Diabetes: Type 2.   ASSESSMENT  History includes: type 2 diabetes, anxiety, asthma, depression, sleep apnea, HTN.  Labs noted: A1c 04/26/23 6.1% Medications include: ozempic Supplements: fish oil  This is an abbreviated appointment as pt checked in 20 minutes late.   A1c down from 6.6% on 01/26/23 to 6.1% on 04/26/23.   Pt has CGM.   Pt states she has been trying to have low sodium and higher protein diet and focusing on water intake.   Pt states soimetimes she is still full from lunch and wont have dinner. She thinks ozempic has reduced her appetite.   Pt reports if her blood sugar drops she has apple juice or fruit pinch to bring it up. She states it goes low about 2 times per week.   Weight 267 lb (121.1 kg). Body mass index is 47.3 kg/m.   Diabetes Self-Management Education - 07/27/23 1137       Visit Information   Visit Type First/Initial      Initial Visit   Diabetes Type Type 2    Date Diagnosed 06/2022    Are you currently following a meal plan? No    Are you taking your medications as prescribed? Yes      Health Coping   How would you rate your overall health? Poor      Psychosocial Assessment   Patient Belief/Attitude about Diabetes Afraid    What is the hardest part about your diabetes right now, causing you the most concern, or is the most worrisome to you about your diabetes?   Making healty food and beverage choices    Self-care barriers None    Self-management support Doctor's office    Other persons present Patient    Patient Concerns Nutrition/Meal planning    Special Needs None    Preferred Learning Style No preference indicated    Learning Readiness Ready    How often do you need to have someone help you when you read  instructions, pamphlets, or other written materials from your doctor or pharmacy? 1 - Never    What is the last grade level you completed in school? some college      Pre-Education Assessment   Patient understands the diabetes disease and treatment process. Needs Instruction    Patient understands incorporating nutritional management into lifestyle. Needs Instruction    Patient undertands incorporating physical activity into lifestyle. Needs Instruction    Patient understands using medications safely. Needs Instruction    Patient understands monitoring blood glucose, interpreting and using results Needs Instruction    Patient understands prevention, detection, and treatment of acute complications. Needs Instruction    Patient understands prevention, detection, and treatment of chronic complications. Needs Instruction    Patient understands how to develop strategies to address psychosocial issues. Needs Instruction    Patient understands how to develop strategies to promote health/change behavior. Needs Instruction      Complications   Last HgB A1C per patient/outside source 6.1 %    How often do you check your blood sugar? > 4 times/day    Fasting Blood glucose range (mg/dL) 81-191    Postprandial Blood glucose range (mg/dL) 478-295;>621    Number of hypoglycemic episodes per month 8    Can you tell when your blood sugar  is low? Yes    What do you do if your blood sugar is low? glucose tabs or juice    Have you had a dilated eye exam in the past 12 months? No    Have you had a dental exam in the past 12 months? No    Are you checking your feet? No      Dietary Intake   Breakfast breakfast burrito with eggs and veggies and hashbrowns OR oatmeal and boiled eggs    Snack (morning) none    Lunch rice and stew    Snack (afternoon) crackers or fruit    Dinner none OR chicken and vegetables OR pasta    Snack (evening) none    Beverage(s) 32-64 oz water      Activity / Exercise   Activity  / Exercise Type ADL's    How many days per week do you exercise? 0    How many minutes per day do you exercise? 0    Total minutes per week of exercise 0      Patient Education   Previous Diabetes Education No    Disease Pathophysiology Definition of diabetes, type 1 and 2, and the diagnosis of diabetes;Explored patient's options for treatment of their diabetes    Healthy Eating Role of diet in the treatment of diabetes and the relationship between the three main macronutrients and blood glucose level;Plate Method;Meal timing in regards to the patients' current diabetes medication.;Information on hints to eating out and maintain blood glucose control.;Meal options for control of blood glucose level and chronic complications.    Being Active Role of exercise on diabetes management, blood pressure control and cardiac health.    Medications Taught/reviewed insulin/injectables, injection, site rotation, insulin/injectables storage and needle disposal.;Reviewed patients medication for diabetes, action, purpose, timing of dose and side effects.    Monitoring Taught/evaluated CGM (comment)    Acute complications Taught prevention, symptoms, and  treatment of hypoglycemia - the 15 rule.;Discussed and identified patients' prevention, symptoms, and treatment of hyperglycemia.    Chronic complications Relationship between chronic complications and blood glucose control;Identified and discussed with patient  current chronic complications    Diabetes Stress and Support Identified and addressed patients feelings and concerns about diabetes;Worked with patient to identify barriers to care and solutions;Role of stress on diabetes    Lifestyle and Health Coping Lifestyle issues that need to be addressed for better diabetes care      Individualized Goals (developed by patient)   Nutrition General guidelines for healthy choices and portions discussed    Physical Activity Exercise 3-5 times per week;30 minutes per  day;Exercise 1-2 times per week    Medications take my medication as prescribed    Monitoring  Consistenly use CGM    Problem Solving Eating Pattern    Reducing Risk examine blood glucose patterns;do foot checks daily;treat hypoglycemia with 15 grams of carbs if blood glucose less than 70mg /dL    Health Coping Ask for help with psychological, social, or emotional issues      Post-Education Assessment   Patient understands the diabetes disease and treatment process. Comprehends key points    Patient understands incorporating nutritional management into lifestyle. Comprehends key points    Patient undertands incorporating physical activity into lifestyle. Comprehends key points    Patient understands using medications safely. Comphrehends key points    Patient understands monitoring blood glucose, interpreting and using results Comprehends key points    Patient understands prevention, detection, and treatment of acute complications. Comprehends  key points    Patient understands prevention, detection, and treatment of chronic complications. Comprehends key points    Patient understands how to develop strategies to address psychosocial issues. Comprehends key points    Patient understands how to develop strategies to promote health/change behavior. Comprehends key points      Outcomes   Expected Outcomes Demonstrated interest in learning. Expect positive outcomes    Future DMSE 3-4 months    Program Status Not Completed             Individualized Plan for Diabetes Self-Management Training:   Learning Objective:  Patient will have a greater understanding of diabetes self-management. Patient education plan is to attend individual and/or group sessions per assessed needs and concerns.   Plan:   Patient Instructions  Goal: go walking 2 times per week for 30 minutes, or do a youtube workout at home.   Goal: eat at least 3 times per day.  - When snacking, aim to include a complex carb  and protein. - At meals, aim to include 1/2 plate non-starchy vegetables, 1/4 plate protein, and 1/4 plate complex carbs.    Expected Outcomes:  Demonstrated interest in learning. Expect positive outcomes  Education material provided: ADA - How to Thrive: A Guide for Your Journey with Diabetes and My Plate  If problems or questions, patient to contact team via:  Phone  Future DSME appointment: 3-4 months

## 2023-08-04 ENCOUNTER — Encounter: Payer: Self-pay | Admitting: Pharmacist

## 2023-08-08 ENCOUNTER — Ambulatory Visit (INDEPENDENT_AMBULATORY_CARE_PROVIDER_SITE_OTHER): Payer: Managed Care, Other (non HMO) | Admitting: Internal Medicine

## 2023-08-08 ENCOUNTER — Encounter: Payer: Self-pay | Admitting: Internal Medicine

## 2023-08-08 VITALS — BP 126/84 | HR 71 | Temp 98.0°F | Ht 63.0 in | Wt 263.2 lb

## 2023-08-08 DIAGNOSIS — N182 Chronic kidney disease, stage 2 (mild): Secondary | ICD-10-CM

## 2023-08-08 DIAGNOSIS — E1122 Type 2 diabetes mellitus with diabetic chronic kidney disease: Secondary | ICD-10-CM

## 2023-08-08 DIAGNOSIS — Z0000 Encounter for general adult medical examination without abnormal findings: Secondary | ICD-10-CM | POA: Diagnosis not present

## 2023-08-08 DIAGNOSIS — G4733 Obstructive sleep apnea (adult) (pediatric): Secondary | ICD-10-CM

## 2023-08-08 DIAGNOSIS — J4520 Mild intermittent asthma, uncomplicated: Secondary | ICD-10-CM

## 2023-08-08 DIAGNOSIS — I129 Hypertensive chronic kidney disease with stage 1 through stage 4 chronic kidney disease, or unspecified chronic kidney disease: Secondary | ICD-10-CM | POA: Diagnosis not present

## 2023-08-08 DIAGNOSIS — Z6841 Body Mass Index (BMI) 40.0 and over, adult: Secondary | ICD-10-CM

## 2023-08-08 DIAGNOSIS — R419 Unspecified symptoms and signs involving cognitive functions and awareness: Secondary | ICD-10-CM | POA: Insufficient documentation

## 2023-08-08 DIAGNOSIS — J309 Allergic rhinitis, unspecified: Secondary | ICD-10-CM | POA: Insufficient documentation

## 2023-08-08 DIAGNOSIS — H6123 Impacted cerumen, bilateral: Secondary | ICD-10-CM | POA: Insufficient documentation

## 2023-08-08 DIAGNOSIS — R0982 Postnasal drip: Secondary | ICD-10-CM

## 2023-08-08 DIAGNOSIS — D573 Sickle-cell trait: Secondary | ICD-10-CM

## 2023-08-08 NOTE — Assessment & Plan Note (Signed)
Chronic, she is encouraged to stay well hydrated.

## 2023-08-08 NOTE — Assessment & Plan Note (Signed)
Chronic, encouraged to wear CPAP at least four hours per night.

## 2023-08-08 NOTE — Assessment & Plan Note (Signed)
Chronic, currently stable. She is encouraged to avoid known triggers. She will continue with Symbicort  80/4.5mg  2 puffs twice daily.

## 2023-08-08 NOTE — Assessment & Plan Note (Signed)
Chronic, fair control. Goal BP<120/80.  EKG performed, NSR w/o acute changes. She will continue with spironolactone 25mg  daily and carvedilol 25mg  twice daily. She is reminded to follow low sodium diet. She will f/u in four months.

## 2023-08-08 NOTE — Assessment & Plan Note (Addendum)
She was given samples of Zyrtec, 10mg  to take daily prn. She will let me know if effective.

## 2023-08-08 NOTE — Assessment & Plan Note (Signed)
BMI 46. She was congratulated on her 8lb weight loss since May 2024.  She is encouraged to initially strive for BMI less than 40 to decrease cardiac risk. Advised to aim for at least 150 minutes of exercise per week.

## 2023-08-08 NOTE — Patient Instructions (Signed)

## 2023-08-08 NOTE — Assessment & Plan Note (Signed)

## 2023-08-08 NOTE — Assessment & Plan Note (Signed)
I think her sx could be due to ADD. She was advised to do online questionnaire for ADD. If positive, I do think she should consider formal ADD testing. We do not want her sx to affect her schooling or her job.

## 2023-08-08 NOTE — Assessment & Plan Note (Signed)
AFTER OBTAINING VERBAL CONSENT, BOTH EARS WERE FLUSHED BY IRRIGATION. SHE TOLERATED PROCEDURE WELL WITHOUT ANY COMPLICATIONS. NO TM ABNORMALITIES WERE NOTED.

## 2023-08-08 NOTE — Progress Notes (Signed)
I,[name], CMA,acting as a Neurosurgeon for [name], MD.,have documented all relevant documentation on the behalf of [name], MD,as directed by  [name], MD while in the presence of [name], MD.  Subjective:    [name] , female    DOB: [birth year] , 32 y.o.   MRN: [id number]  Chief Complaint  Patient presents with   Annual Exam   Diabetes   Hypertension    HPI  Patient presents today for annual exam. She is followed by GYN: Dr Su Hilt for her pelvic exams. She reports compliance with meds. Denies headache, chest pain, and SOB.    She reports having DM eye exam scheduled for 08/25/23.    Hypertension This is a chronic problem. The current episode started more than 1 year ago. The problem has been gradually improving since onset. The problem is controlled. Pertinent negatives include no blurred vision, chest pain, headaches or neck pain. Risk factors for coronary artery disease include obesity. The current treatment provides moderate improvement.     Past Medical History:  Diagnosis Date   Anxiety    Asthma    Back pain    Chest pain    Depression    Diabetes mellitus without complication (HCC)    Edema, lower extremity    Fluid collection (edema) in the arms, legs, hands and feet    Hypertension    Pericardial effusion    Sleep apnea    SOB (shortness of breath)      Family History  Problem Relation Age of Onset   Healthy Mother    High blood pressure Father    Sickle cell trait Father      Current Outpatient Medications:    albuterol (VENTOLIN HFA) 108 (90 Base) MCG/ACT inhaler, Inhale 2 puffs into the lungs every 6 (six) hours as needed for wheezing or shortness of breath., Disp: 54 g, Rfl: 2   budesonide-formoterol (SYMBICORT) 80-4.5 MCG/ACT inhaler, Inhale 2 puffs into the lungs in the morning and at bedtime., Disp: 10.2 g, Rfl: 3   carvedilol (COREG) 25 MG tablet, TAKE 1 TABLET(25 MG) BY MOUTH TWICE DAILY  WITH A MEAL, Disp: 180 tablet, Rfl: 1   Continuous Blood Gluc Receiver (FREESTYLE LIBRE 3 READER) DEVI, 1 applicator by Does not apply route as directed., Disp: 1 each, Rfl: 1   Continuous Glucose Sensor (FREESTYLE LIBRE 3 SENSOR) MISC, Use as directed to check blood sugars, Disp: 1 each, Rfl: 3   famotidine (PEPCID) 20 MG tablet, TAKE 1 TABLET(20 MG) BY MOUTH DAILY, Disp: 90 tablet, Rfl: 1   naproxen (NAPROSYN) 375 MG tablet, Take 1 tablet twice daily as needed for chest wall pain., Disp: 20 tablet, Rfl: 0   norethindrone (AYGESTIN) 5 MG tablet, TAKE 2 TABLETS(10 MG) BY MOUTH EVERY DAY, Disp: 180 tablet, Rfl: 0   Semaglutide, 2 MG/DOSE, (OZEMPIC, 2 MG/DOSE,) 8 MG/3ML SOPN, INJECT 2 MG UNDER THE SKIN ONE DAY A WEEK, Disp: 3 mL, Rfl: 1   spironolactone (ALDACTONE) 25 MG tablet, Take 25 mg by mouth daily., Disp: , Rfl:    benzonatate (TESSALON) 100 MG capsule, Take 1 capsule (100 mg total) by mouth every 8 (eight) hours. (Patient not taking: Reported on 04/25/2023), Disp: 21 capsule, Rfl: 0   minocycline (DYNACIN) 50 MG tablet, Take 1 tablet (50 mg total) by mouth 2 (two) times daily. (Patient not taking: Reported on 04/25/2023), Disp: 180 tablet, Rfl: 0   montelukast (SINGULAIR) 10 MG  tablet, Take 1 tablet (10 mg total) by mouth daily., Disp: 90 tablet, Rfl: 2   Allergies  Allergen Reactions   Mushroom Extract Complex Other (See Comments)    Flu like symptoms       The patient states she uses OCP (estrogen/progesterone) for birth control. No LMP recorded (exact date). (Menstrual status: Oral contraceptives).. Negative for Dysmenorrhea. Negative for: breast discharge, breast lump(s), breast pain and breast self exam. Associated symptoms include abnormal vaginal bleeding. Pertinent negatives include abnormal bleeding (hematology), anxiety, decreased libido, depression, difficulty falling sleep, dyspareunia, history of infertility, nocturia, sexual dysfunction, sleep disturbances, urinary incontinence,  urinary urgency, vaginal discharge and vaginal itching. Diet regular.The patient states her exercise level is  intermittent.  . The patient's tobacco use is:  Social History   Tobacco Use  Smoking Status Never   Passive exposure: Never  Smokeless Tobacco Never  . She has been exposed to passive smoke. The patient's alcohol use is:  Social History   Substance and Sexual Activity  Alcohol Use Yes   Comment: occ   Review of Systems  Constitutional: Negative.   HENT: Negative.    Eyes: Negative.  Negative for blurred vision.  Respiratory: Negative.    Cardiovascular: Negative.  Negative for chest pain.  Gastrointestinal: Negative.   Endocrine: Negative.   Genitourinary: Negative.   Musculoskeletal: Negative.  Negative for neck pain.  Skin: Negative.   Allergic/Immunologic: Negative.   Neurological: Negative.  Negative for headaches.  Hematological: Negative.   Psychiatric/Behavioral: Negative.         She admits to recent sx of decreased comprehension. She denies previous h/o ADD. Does not wish to see ADD specialist at this time.      Today's Vitals   08/08/23 1111  BP: 126/84  Pulse: 71  Temp: 98 F (36.7 C)  SpO2: 98%  Weight: 263 lb 3.2 oz (119.4 kg)  Height: 5\' 3"  (1.6 m)   Body mass index is 46.62 kg/m.  Wt Readings from Last 3 Encounters:  08/08/23 263 lb 3.2 oz (119.4 kg)  07/27/23 267 lb (121.1 kg)  04/26/23 271 lb (122.9 kg)     Objective:  Physical Exam Vitals and nursing note reviewed.  Constitutional:      Appearance: Normal appearance.  HENT:     Head: Normocephalic and atraumatic.     Right Ear: Ear canal and external ear normal. There is impacted cerumen.     Left Ear: Ear canal and external ear normal. There is impacted cerumen.  Eyes:     Extraocular Movements: Extraocular movements intact.  Cardiovascular:     Rate and Rhythm: Normal rate and regular rhythm.     Pulses:          Dorsalis pedis pulses are 2+ on the right side and 2+ on the  left side.     Heart sounds: Normal heart sounds.  Pulmonary:     Effort: Pulmonary effort is normal.     Breath sounds: Normal breath sounds.  Chest:  Breasts:    Tanner Score is 5.     Right: Normal.     Left: Normal.  Abdominal:     General: Bowel sounds are normal.     Palpations: Abdomen is soft.     Comments: Soft, obese  Genitourinary:    Comments: Deferred  Musculoskeletal:     Cervical back: Normal range of motion.  Feet:     Right foot:     Protective Sensation: 5 sites tested.  5  sites sensed.     Skin integrity: Dry skin present.     Toenail Condition: Right toenails are normal.     Left foot:     Protective Sensation: 5 sites tested.  5 sites sensed.     Skin integrity: Dry skin present.     Toenail Condition: Left toenails are normal.  Skin:    General: Skin is warm.  Neurological:     General: No focal deficit present.     Mental Status: She is alert.  Psychiatric:        Mood and Affect: Mood normal.        Behavior: Behavior normal.         Assessment And Plan:     Encounter for annual health examination Assessment & Plan: A full exam was performed.  Importance of monthly self breast exams was discussed with the patient.  She is advised to get 30-45 minutes of regular exercise, no less than four to five days per week. Both weight-bearing and aerobic exercises are recommended.  She is advised to follow a healthy diet with at least six fruits/veggies per day, decrease intake of red meat and other saturated fats and to increase fish intake to twice weekly.  Meats/fish should not be fried -- baked, boiled or broiled is preferable. It is also important to cut back on your sugar intake.  Be sure to read labels - try to avoid anything with added sugar, high fructose corn syrup or other sweeteners.  If you must use a sweetener, you can try stevia or monkfruit.  It is also important to avoid artificially sweetened foods/beverages and diet drinks. Lastly, wear SPF 50  sunscreen on exposed skin and when in direct sunlight for an extended period of time.  Be sure to avoid fast food restaurants and aim for at least 60 ounces of water daily.      Orders: -     CBC -     CMP14+EGFR -     Lipid panel -     Hemoglobin A1c  Type 2 diabetes mellitus with stage 2 chronic kidney disease, without long-term current use of insulin (HCC) Assessment & Plan: Chronic, diabetic foot exam was performed. She will continue with Ozempic 2mg  weekly. She was given sample of SGLT2i by Nephrologist, she plans to send me in Mychart later today.  I DISCUSSED WITH THE PATIENT AT LENGTH REGARDING THE GOALS OF GLYCEMIC CONTROL AND POSSIBLE LONG-TERM COMPLICATIONS.  I  ALSO STRESSED THE IMPORTANCE OF COMPLIANCE WITH HOME GLUCOSE MONITORING, DIETARY RESTRICTIONS INCLUDING AVOIDANCE OF SUGARY DRINKS/PROCESSED FOODS,  ALONG WITH REGULAR EXERCISE.  I  ALSO STRESSED THE IMPORTANCE OF ANNUAL EYE EXAMS, SELF FOOT CARE AND COMPLIANCE WITH OFFICE VISITS.    Orders: -     EKG 12-Lead -     Microalbumin / creatinine urine ratio  Hypertensive nephropathy Assessment & Plan: Chronic, fair control. Goal BP<120/80.  EKG performed, NSR w/o acute changes. She will continue with spironolactone 25mg  daily and carvedilol 25mg  twice daily. She is reminded to follow low sodium diet. She will f/u in four months.   Orders: -     EKG 12-Lead  Mild intermittent asthma without complication Assessment & Plan: Chronic, currently stable. She is encouraged to avoid known triggers. She will continue with Symbicort  80/4.5mg  2 puffs twice daily.    Bilateral impacted cerumen Assessment & Plan: AFTER OBTAINING VERBAL CONSENT, BOTH EARS WERE FLUSHED BY IRRIGATION. SHE TOLERATED PROCEDURE WELL WITHOUT ANY COMPLICATIONS. NO TM  ABNORMALITIES WERE NOTED.   Orders: -     Ear Lavage  Allergic rhinitis with postnasal drip Assessment & Plan: She was given samples of Zyrtec, 10mg  to take daily prn. She will let me  know if effective.    Deficit in comprehension Assessment & Plan: I think her sx could be due to ADD. She was advised to do online questionnaire for ADD. If positive, I do think she should consider formal ADD testing. We do not want her sx to affect her schooling or her job.    OSA on CPAP Assessment & Plan: Chronic, encouraged to wear CPAP at least four hours per night.    Sickle cell trait (HCC) Assessment & Plan: Chronic, she is encouraged to stay well hydrated.    Class 3 severe obesity due to excess calories with serious comorbidity and body mass index (BMI) of 45.0 to 49.9 in adult [facility]) Assessment & Plan: BMI 46. She was congratulated on her 8lb weight loss since May 2024.  She is encouraged to initially strive for BMI less than 40 to decrease cardiac risk. Advised to aim for at least 150 minutes of exercise per week.    She is encouraged to strive for BMI less than 30 to decrease cardiac risk. Advised to aim for at least 150 minutes of exercise per week.    Return for 1 year HM, 4 month dm f/u.Marland Kitchen Patient was given opportunity to ask questions. Patient verbalized understanding of the plan and was able to repeat key elements of the plan. All questions were answered to their satisfaction.     I, [name], MD, have reviewed all documentation for this visit. The documentation on 08/08/23 for the exam, diagnosis, procedures, and orders are all accurate and complete.

## 2023-08-08 NOTE — Assessment & Plan Note (Signed)
Chronic, diabetic foot exam was performed. She will continue with Ozempic 2mg  weekly. She was given sample of SGLT2i by Nephrologist, she plans to send me in Mychart later today.  I DISCUSSED WITH THE PATIENT AT LENGTH REGARDING THE GOALS OF GLYCEMIC CONTROL AND POSSIBLE LONG-TERM COMPLICATIONS.  I  ALSO STRESSED THE IMPORTANCE OF COMPLIANCE WITH HOME GLUCOSE MONITORING, DIETARY RESTRICTIONS INCLUDING AVOIDANCE OF SUGARY DRINKS/PROCESSED FOODS,  ALONG WITH REGULAR EXERCISE.  I  ALSO STRESSED THE IMPORTANCE OF ANNUAL EYE EXAMS, SELF FOOT CARE AND COMPLIANCE WITH OFFICE VISITS.

## 2023-08-09 LAB — CMP14+EGFR
ALT: 69 IU/L — ABNORMAL HIGH (ref 0–32)
AST: 26 IU/L (ref 0–40)
Albumin: 4.4 g/dL (ref 3.9–4.9)
Alkaline Phosphatase: 48 IU/L (ref 44–121)
BUN/Creatinine Ratio: 7 — ABNORMAL LOW (ref 9–23)
BUN: 8 mg/dL (ref 6–20)
Bilirubin Total: 0.3 mg/dL (ref 0.0–1.2)
CO2: 23 mmol/L (ref 20–29)
Calcium: 9.5 mg/dL (ref 8.7–10.2)
Chloride: 103 mmol/L (ref 96–106)
Creatinine, Ser: 1.21 mg/dL — ABNORMAL HIGH (ref 0.57–1.00)
Globulin, Total: 3.8 g/dL (ref 1.5–4.5)
Glucose: 101 mg/dL — ABNORMAL HIGH (ref 70–99)
Potassium: 5 mmol/L (ref 3.5–5.2)
Sodium: 139 mmol/L (ref 134–144)
Total Protein: 8.2 g/dL (ref 6.0–8.5)
eGFR: 61 mL/min/{1.73_m2} (ref >59)

## 2023-08-09 LAB — LIPID PANEL
Chol/HDL Ratio: 3.2 ratio (ref 0.0–4.4)
Cholesterol, Total: 161 mg/dL (ref 100–199)
HDL: 50 mg/dL (ref >39)
LDL Chol Calc (NIH): 103 mg/dL — ABNORMAL HIGH (ref 0–99)
Triglycerides: 37 mg/dL (ref 0–149)
VLDL Cholesterol Cal: 8 mg/dL (ref 5–40)

## 2023-08-09 LAB — CBC
Hematocrit: 41.7 % (ref 34.0–46.6)
Hemoglobin: 13.1 g/dL (ref 11.1–15.9)
MCH: 26.5 pg — ABNORMAL LOW (ref 26.6–33.0)
MCHC: 31.4 g/dL — ABNORMAL LOW (ref 31.5–35.7)
MCV: 84 fL (ref 79–97)
Platelets: 219 10*3/uL (ref 150–450)
RBC: 4.95 x10E6/uL (ref 3.77–5.28)
RDW: 15.1 % (ref 11.7–15.4)
WBC: 5.6 10*3/uL (ref 3.4–10.8)

## 2023-08-09 LAB — HEMOGLOBIN A1C
Est. average glucose Bld gHb Est-mCnc: 134 mg/dL
Hgb A1c MFr Bld: 6.3 % — ABNORMAL HIGH (ref 4.8–5.6)

## 2023-08-09 LAB — MICROALBUMIN / CREATININE URINE RATIO
Creatinine, Urine: 212 mg/dL
Microalb/Creat Ratio: 18 mg/g{creat} (ref 0–29)
Microalbumin, Urine: 39.2 ug/mL

## 2023-08-14 ENCOUNTER — Other Ambulatory Visit: Payer: Self-pay | Admitting: Internal Medicine

## 2023-08-14 DIAGNOSIS — R41840 Attention and concentration deficit: Secondary | ICD-10-CM

## 2023-08-23 ENCOUNTER — Other Ambulatory Visit: Payer: Self-pay | Admitting: Internal Medicine

## 2023-08-24 ENCOUNTER — Other Ambulatory Visit: Payer: Self-pay | Admitting: Internal Medicine

## 2023-08-24 DIAGNOSIS — J4520 Mild intermittent asthma, uncomplicated: Secondary | ICD-10-CM

## 2023-08-25 DIAGNOSIS — J4520 Mild intermittent asthma, uncomplicated: Secondary | ICD-10-CM

## 2023-08-29 MED ORDER — ALBUTEROL SULFATE HFA 108 (90 BASE) MCG/ACT IN AERS
2.0000 | INHALATION_SPRAY | Freq: Four times a day (QID) | RESPIRATORY_TRACT | 2 refills | Status: DC | PRN
Start: 2023-08-29 — End: 2023-08-31

## 2023-08-31 ENCOUNTER — Other Ambulatory Visit: Payer: Self-pay | Admitting: Internal Medicine

## 2023-08-31 ENCOUNTER — Other Ambulatory Visit: Payer: Self-pay

## 2023-08-31 DIAGNOSIS — J4520 Mild intermittent asthma, uncomplicated: Secondary | ICD-10-CM

## 2023-08-31 MED ORDER — ALBUTEROL SULFATE HFA 108 (90 BASE) MCG/ACT IN AERS
2.0000 | INHALATION_SPRAY | Freq: Four times a day (QID) | RESPIRATORY_TRACT | 2 refills | Status: AC | PRN
Start: 2023-08-31 — End: ?

## 2023-09-01 ENCOUNTER — Other Ambulatory Visit: Payer: Self-pay

## 2023-09-01 MED ORDER — OZEMPIC (2 MG/DOSE) 8 MG/3ML ~~LOC~~ SOPN: PEN_INJECTOR | SUBCUTANEOUS | 1 refills | Status: DC

## 2023-09-12 ENCOUNTER — Other Ambulatory Visit: Payer: Self-pay | Admitting: Internal Medicine

## 2023-09-12 DIAGNOSIS — I10 Essential (primary) hypertension: Secondary | ICD-10-CM

## 2023-09-20 LAB — HM DIABETES EYE EXAM

## 2023-09-25 ENCOUNTER — Encounter: Payer: Self-pay | Admitting: Internal Medicine

## 2023-09-28 ENCOUNTER — Encounter: Payer: Self-pay | Admitting: Internal Medicine

## 2023-09-28 ENCOUNTER — Ambulatory Visit (INDEPENDENT_AMBULATORY_CARE_PROVIDER_SITE_OTHER): Payer: Managed Care, Other (non HMO) | Admitting: Internal Medicine

## 2023-09-28 ENCOUNTER — Other Ambulatory Visit: Payer: Self-pay

## 2023-09-28 VITALS — BP 128/84 | HR 91 | Temp 97.8°F | Ht 63.0 in | Wt 253.6 lb

## 2023-09-28 DIAGNOSIS — E1165 Type 2 diabetes mellitus with hyperglycemia: Secondary | ICD-10-CM

## 2023-09-28 DIAGNOSIS — E66813 Obesity, class 3: Secondary | ICD-10-CM | POA: Diagnosis not present

## 2023-09-28 DIAGNOSIS — R5383 Other fatigue: Secondary | ICD-10-CM

## 2023-09-28 DIAGNOSIS — R748 Abnormal levels of other serum enzymes: Secondary | ICD-10-CM | POA: Insufficient documentation

## 2023-09-28 DIAGNOSIS — Z6841 Body Mass Index (BMI) 40.0 and over, adult: Secondary | ICD-10-CM

## 2023-09-28 DIAGNOSIS — Z79899 Other long term (current) drug therapy: Secondary | ICD-10-CM

## 2023-09-28 MED ORDER — FREESTYLE LIBRE 3 SENSOR MISC
3 refills | Status: DC
Start: 2023-09-28 — End: 2024-10-01

## 2023-09-28 MED ORDER — CYANOCOBALAMIN 1000 MCG/ML IJ SOLN
1000.0000 ug | Freq: Once | INTRAMUSCULAR | Status: AC
  Administered 2023-09-28: 1000 ug via INTRAMUSCULAR

## 2023-09-28 NOTE — Patient Instructions (Signed)
Fatigue If you have fatigue, you feel tired all the time and have a lack of energy or a lack of motivation. Fatigue may make it difficult to start or complete tasks because of exhaustion. Occasional or mild fatigue is often a normal response to activity or life. However, long-term (chronic) or extreme fatigue may be a symptom of a medical condition such as: Depression. Not having enough red blood cells or hemoglobin in the blood (anemia). A problem with a small gland located in the lower front part of the neck (thyroid disorder). Rheumatologic conditions. These are problems related to the body's defense system (immune system). Infections, especially certain viral infections. Fatigue can also lead to negative health outcomes over time. Follow these instructions at home: Medicines Take over-the-counter and prescription medicines only as told by your health care provider. Take a multivitamin if told by your health care provider. Do not use herbal or dietary supplements unless they are approved by your health care provider. Eating and drinking  Avoid heavy meals in the evening. Eat a well-balanced diet, which includes lean proteins, whole grains, plenty of fruits and vegetables, and low-fat dairy products. Avoid eating or drinking too many products with caffeine in them. Avoid alcohol. Drink enough fluid to keep your urine pale yellow. Activity  Exercise regularly, as told by your health care provider. Use or practice techniques to help you relax, such as yoga, tai chi, meditation, or massage therapy. Lifestyle Change situations that cause you stress. Try to keep your work and personal schedules in balance. Do not use recreational or illegal drugs. General instructions Monitor your fatigue for any changes. Go to bed and get up at the same time every day. Avoid fatigue by pacing yourself during the day and getting enough sleep at night. Maintain a healthy weight. Contact a health care  provider if: Your fatigue does not get better. You have a fever. You suddenly lose or gain weight. You have headaches. You have trouble falling asleep or sleeping through the night. You feel angry, guilty, anxious, or sad. You have swelling in your legs or another part of your body. Get help right away if: You feel confused, feel like you might faint, or faint. Your vision is blurry or you have a severe headache. You have severe pain in your abdomen, your back, or the area between your waist and hips (pelvis). You have chest pain, shortness of breath, or an irregular or fast heartbeat. You are unable to urinate, or you urinate less than normal. You have abnormal bleeding from the rectum, nose, lungs, nipples, or, if you are female, the vagina. You vomit blood. You have thoughts about hurting yourself or others. These symptoms may be an emergency. Get help right away. Call 911. Do not wait to see if the symptoms will go away. Do not drive yourself to the hospital. Get help right away if you feel like you may hurt yourself or others, or have thoughts about taking your own life. Go to your nearest emergency room or: Call 911. Call the National Suicide Prevention Lifeline at 1-800-273-8255 or 988. This is open 24 hours a day. Text the Crisis Text Line at 741741. Summary If you have fatigue, you feel tired all the time and have a lack of energy or a lack of motivation. Fatigue may make it difficult to start or complete tasks because of exhaustion. Long-term (chronic) or extreme fatigue may be a symptom of a medical condition. Exercise regularly, as told by your health care provider.   Change situations that cause you stress. Try to keep your work and personal schedules in balance. This information is not intended to replace advice given to you by your health care provider. Make sure you discuss any questions you have with your health care provider. Document Revised: 09/06/2021 Document  Reviewed: 09/06/2021 Elsevier Patient Education  2024 [employer].  

## 2023-09-29 LAB — HEPATIC FUNCTION PANEL
ALT: 45 [IU]/L — ABNORMAL HIGH (ref 0–32)
AST: 21 [IU]/L (ref 0–40)
Albumin: 4.6 g/dL (ref 3.9–4.9)
Alkaline Phosphatase: 52 [IU]/L (ref 44–121)
Bilirubin Total: 0.3 mg/dL (ref 0.0–1.2)
Bilirubin, Direct: <0.1 mg/dL (ref 0.00–0.40)
Total Protein: 8 g/dL (ref 6.0–8.5)

## 2023-09-29 LAB — VITAMIN B12: Vitamin B-12: 491 pg/mL (ref 232–1245)

## 2023-09-29 LAB — TSH: TSH: 1.92 u[IU]/mL (ref 0.450–4.500)

## 2023-09-29 LAB — GAMMA GT: GGT: 39 [IU]/L (ref 0–60)

## 2023-10-01 NOTE — Assessment & Plan Note (Signed)
I will check labs as below.  I will make further recommendations once her labs have been reviewed. Encouraged to stay well hydrated. Also reminded to optimize her protein intake.

## 2023-10-01 NOTE — Assessment & Plan Note (Signed)
She was congratulated on her 10lb weight loss over the past eight weeks. Encouraged to keep up the great work and to aim for at least 150 minutes of exercise per week.

## 2023-10-01 NOTE — Assessment & Plan Note (Signed)
She is advised to decrease sugar intake and to cut back on processed meat intake. I will check labs as below.

## 2023-10-17 ENCOUNTER — Other Ambulatory Visit: Payer: Self-pay | Admitting: Internal Medicine

## 2023-10-23 ENCOUNTER — Encounter: Payer: Self-pay | Admitting: Internal Medicine

## 2023-10-24 ENCOUNTER — Other Ambulatory Visit: Payer: Self-pay

## 2023-10-24 MED ORDER — OZEMPIC (2 MG/DOSE) 8 MG/3ML ~~LOC~~ SOPN: PEN_INJECTOR | SUBCUTANEOUS | 1 refills | Status: DC

## 2023-11-05 ENCOUNTER — Encounter: Payer: Self-pay | Admitting: Internal Medicine

## 2023-11-08 ENCOUNTER — Ambulatory Visit: Payer: Managed Care, Other (non HMO) | Admitting: Dietician

## 2023-11-19 ENCOUNTER — Other Ambulatory Visit: Payer: Self-pay

## 2023-11-19 ENCOUNTER — Emergency Department (HOSPITAL_BASED_OUTPATIENT_CLINIC_OR_DEPARTMENT_OTHER): Payer: 59

## 2023-11-19 ENCOUNTER — Emergency Department (HOSPITAL_BASED_OUTPATIENT_CLINIC_OR_DEPARTMENT_OTHER)
Admission: EM | Admit: 2023-11-19 | Discharge: 2023-11-19 | Disposition: A | Discharge status: Home / Self Care | Payer: 59 | Attending: Emergency Medicine | Admitting: Emergency Medicine

## 2023-11-19 ENCOUNTER — Encounter (HOSPITAL_BASED_OUTPATIENT_CLINIC_OR_DEPARTMENT_OTHER): Payer: Self-pay | Admitting: Emergency Medicine

## 2023-11-19 DIAGNOSIS — R079 Chest pain, unspecified: Secondary | ICD-10-CM

## 2023-11-19 DIAGNOSIS — R791 Abnormal coagulation profile: Secondary | ICD-10-CM | POA: Diagnosis not present

## 2023-11-19 DIAGNOSIS — I10 Essential (primary) hypertension: Secondary | ICD-10-CM | POA: Insufficient documentation

## 2023-11-19 DIAGNOSIS — R0789 Other chest pain: Secondary | ICD-10-CM | POA: Insufficient documentation

## 2023-11-19 LAB — COMPREHENSIVE METABOLIC PANEL
ALT: 33 U/L (ref 0–44)
AST: 22 U/L (ref 15–41)
Albumin: 3.5 g/dL (ref 3.5–5.0)
Alkaline Phosphatase: 42 U/L (ref 38–126)
Anion gap: 7 (ref 5–15)
BUN: 12 mg/dL (ref 6–20)
CO2: 20 mmol/L — ABNORMAL LOW (ref 22–32)
Calcium: 8.4 mg/dL — ABNORMAL LOW (ref 8.9–10.3)
Chloride: 109 mmol/L (ref 98–111)
Creatinine, Ser: 1.07 mg/dL — ABNORMAL HIGH (ref 0.44–1.00)
GFR, Estimated: >60 mL/min (ref >60)
Glucose, Bld: 122 mg/dL — ABNORMAL HIGH (ref 70–99)
Potassium: 3.8 mmol/L (ref 3.5–5.1)
Sodium: 136 mmol/L (ref 135–145)
Total Bilirubin: 0.4 mg/dL (ref <1.2)
Total Protein: 7.2 g/dL (ref 6.5–8.1)

## 2023-11-19 LAB — CBC WITH DIFFERENTIAL/PLATELET
Abs Immature Granulocytes: 0.01 10*3/uL (ref 0.00–0.07)
Basophils Absolute: 0 10*3/uL (ref 0.0–0.1)
Basophils Relative: 0 %
Eosinophils Absolute: 0.1 10*3/uL (ref 0.0–0.5)
Eosinophils Relative: 1 %
HCT: 34 % — ABNORMAL LOW (ref 36.0–46.0)
Hemoglobin: 11.6 g/dL — ABNORMAL LOW (ref 12.0–15.0)
Immature Granulocytes: 0 %
Lymphocytes Relative: 34 %
Lymphs Abs: 1.7 10*3/uL (ref 0.7–4.0)
MCH: 26.8 pg (ref 26.0–34.0)
MCHC: 34.1 g/dL (ref 30.0–36.0)
MCV: 78.5 fL — ABNORMAL LOW (ref 80.0–100.0)
Monocytes Absolute: 0.5 10*3/uL (ref 0.1–1.0)
Monocytes Relative: 10 %
Neutro Abs: 2.7 10*3/uL (ref 1.7–7.7)
Neutrophils Relative %: 55 %
Platelets: 167 10*3/uL (ref 150–400)
RBC: 4.33 MIL/uL (ref 3.87–5.11)
RDW: 14.7 % (ref 11.5–15.5)
WBC: 5 10*3/uL (ref 4.0–10.5)
nRBC: 0 % (ref 0.0–0.2)

## 2023-11-19 LAB — D-DIMER, QUANTITATIVE: D-Dimer, Quant: 1.16 ug{FEU}/mL — ABNORMAL HIGH (ref 0.00–0.50)

## 2023-11-19 LAB — CBG MONITORING, ED: Glucose-Capillary: 122 mg/dL — ABNORMAL HIGH (ref 70–99)

## 2023-11-19 LAB — TROPONIN I (HIGH SENSITIVITY): Troponin I (High Sensitivity): 4 ng/L (ref <18)

## 2023-11-19 MED ORDER — FENTANYL CITRATE PF 50 MCG/ML IJ SOSY
50.0000 ug | PREFILLED_SYRINGE | Freq: Once | INTRAMUSCULAR | Status: AC
  Administered 2023-11-19: 50 ug via INTRAVENOUS
  Filled 2023-11-19: qty 1

## 2023-11-19 MED ORDER — LORAZEPAM 2 MG/ML IJ SOLN
1.0000 mg | Freq: Once | INTRAMUSCULAR | Status: AC
  Administered 2023-11-19: 1 mg via INTRAVENOUS
  Filled 2023-11-19: qty 1

## 2023-11-19 MED ORDER — IOHEXOL 350 MG/ML SOLN
75.0000 mL | Freq: Once | INTRAVENOUS | Status: AC | PRN
  Administered 2023-11-19: 75 mL via INTRAVENOUS

## 2023-11-19 NOTE — ED Triage Notes (Addendum)
Substernal CP that started around 1730. Pt states it radiates to her back and up the R side of her neck. She states it hurts to take a deep breath and her chest feels heavy. States she took naproxen earlier with no relief. No recent long distance trips. Pt is on oral contraceptives. Non smoker. No other sx. Tender on palpation to sternum. Pt was driving at onset of pain.

## 2023-11-19 NOTE — ED Provider Notes (Signed)
[facility] Provider Note   CSN: [id number] Arrival date & time: 11/19/23  0143     History {Add pertinent medical, surgical, social history, OB history to HPI:1} Chief Complaint  Patient presents with   Chest Pain    Kimberly Heath is a 32 y.o. female.   Chest Pain      Home Medications Prior to Admission medications   Medication Sig Start Date End Date Taking? Authorizing Provider  albuterol (VENTOLIN HFA) 108 (90 Base) MCG/ACT inhaler Inhale 2 puffs into the lungs every 6 (six) hours as needed for wheezing or shortness of breath. 08/31/23   Dorothyann Peng, MD  benzonatate (TESSALON) 100 MG capsule Take 1 capsule (100 mg total) by mouth every 8 (eight) hours. Patient not taking: Reported on 04/25/2023 03/11/23   Darrick Grinder, PA-C  budesonide-formoterol [facility]) 80-4.5 MCG/ACT inhaler Inhale 2 puffs into the lungs in the morning and at bedtime. 04/26/23   Dorothyann Peng, MD  carvedilol (COREG) 25 MG tablet TAKE 1 TABLET(25 MG) BY MOUTH TWICE DAILY WITH A MEAL 09/12/23   Dorothyann Peng, MD  Continuous Blood Gluc Receiver (FREESTYLE LIBRE 3 READER) DEVI 1 applicator by Does not apply route as directed. 02/06/23   Dorothyann Peng, MD  Continuous Glucose Sensor (FREESTYLE LIBRE 3 SENSOR) MISC Use as directed to check blood sugars 09/28/23   Dorothyann Peng, MD  famotidine (PEPCID) 20 MG tablet TAKE 1 TABLET(20 MG) BY MOUTH DAILY 10/10/22   Dorothyann Peng, MD  montelukast (SINGULAIR) 10 MG tablet Take 1 tablet (10 mg total) by mouth daily. 07/27/22 07/27/23  Dorothyann Peng, MD  naproxen (NAPROSYN) 375 MG tablet Take 1 tablet twice daily as needed for chest wall pain. 01/10/23   Molpus, John, MD  norethindrone (AYGESTIN) 5 MG tablet TAKE 2 TABLETS(10 MG) BY MOUTH EVERY DAY 03/27/23   Dorothyann Peng, MD  Semaglutide, 2 MG/DOSE, (OZEMPIC, 2 MG/DOSE,) 8 MG/3ML SOPN INJECT 2MG  SUBCUTANEOUS ONCE A WEEK 10/24/23   Dorothyann Peng, MD   spironolactone (ALDACTONE) 25 MG tablet Take 25 mg by mouth daily.    [provider]      Allergies    Mushroom extract complex (do not select)    Review of Systems   Review of Systems  Cardiovascular:  Positive for chest pain.    Physical Exam Updated Vital Signs BP (!) 164/101 (BP Location: Right Arm)   Pulse 81   Temp 98.2 F (36.8 C) (Oral)   Resp 20   Ht 5\' 3"  (1.6 m)   Wt 117 kg   SpO2 100%   BMI 45.70 kg/m  Physical Exam  ED Results / Procedures / Treatments   Labs (all labs ordered are listed, but only abnormal results are displayed) Labs Reviewed  CBG MONITORING, ED - Abnormal; Notable for the following components:      Result Value   Glucose-Capillary 122 (*)    All other components within normal limits  CBC WITH DIFFERENTIAL/PLATELET  COMPREHENSIVE METABOLIC PANEL  D-DIMER, QUANTITATIVE  TROPONIN I (HIGH SENSITIVITY)    EKG EKG Interpretation Date/Time:  Sunday November 19 2023 01:53:20 EST Ventricular Rate:  84 PR Interval:  130 QRS Duration:  85 QT Interval:  375 QTC Calculation: 444 R Axis:   83  Text Interpretation: Sinus rhythm Confirmed by Marily Memos [phone]) on 11/19/2023 1:59:50 AM  Radiology No results found.  Procedures Procedures  {Document cardiac monitor, telemetry assessment procedure when appropriate:1}  Medications Ordered in ED Medications  fentaNYL (SUBLIMAZE) injection 50 mcg (has no administration in time range)    ED Course/ Medical Decision Making/ A&P   {   Click here for ABCD2, HEART and other calculatorsREFRESH Note before signing :1}                              Medical Decision Making Amount and/or Complexity of Data Reviewed Labs: ordered. Radiology: ordered.  Risk Prescription drug management.   ***  {Document critical care time when appropriate:1} {Document review of labs and clinical decision tools ie heart score, Chads2Vasc2 etc:1}  {Document your independent review of radiology  images, and any outside records:1} {Document your discussion with family members, caretakers, and with consultants:1} {Document social determinants of health affecting pt's care:1} {Document your decision making why or why not admission, treatments were needed:1} Final Clinical Impression(s) / ED Diagnoses Final diagnoses:  None    Rx / DC Orders ED Discharge Orders     None

## 2023-11-20 ENCOUNTER — Telehealth: Payer: Self-pay

## 2023-11-20 NOTE — Transitions of Care (Post Inpatient/ED Visit) (Signed)
11/20/2023  Name: Kimberly Heath MRN: [id number] DOB: [birth year]  Today's TOC FU Call Status: Today's TOC FU Call Status:: Successful TOC FU Call Completed TOC FU Call Complete Date: 11/20/23 Patient's Name and Date of Birth confirmed.  Transition Care Management Follow-up Telephone Call Date of Discharge: 11/19/23 Discharge Facility: [facility] Type of Discharge: Inpatient Admission Primary Inpatient Discharge Diagnosis:: Nonspecific chest pain How have you been since you were released from the hospital?: Better Any questions or concerns?: No  Items Reviewed: Did you receive and understand the discharge instructions provided?: Yes Medications obtained,verified, and reconciled?: Yes (Medications Reviewed) Any new allergies since your discharge?: No Dietary orders reviewed?: No Do you have support at home?: Yes People in Home: sibling(s)  Medications Reviewed Today: Medications Reviewed Today     Reviewed by Marlyn Corporal, CMA (Certified Medical Assistant) on 11/20/23 at 1232  Med List Status: <None>   Medication Order Taking? Sig Documenting Provider Last Dose Status Informant  albuterol (VENTOLIN HFA) 108 (90 Base) MCG/ACT inhaler [id number] Yes Inhale 2 puffs into the lungs every 6 (six) hours as needed for wheezing or shortness of breath. Dorothyann Peng, MD Taking Active   benzonatate [facility]) 100 MG capsule [id number] No Take 1 capsule (100 mg total) by mouth every 8 (eight) hours.  Patient not taking: Reported on 11/20/2023   Pamala Duffel Not Taking Active   budesonide-formoterol [facility]) 80-4.5 MCG/ACT inhaler [id number] Yes Inhale 2 puffs into the lungs in the morning and at bedtime. Dorothyann Peng, MD Taking Active   carvedilol (COREG) 25 MG tablet [id number] Yes TAKE 1 TABLET(25 MG) BY MOUTH TWICE DAILY WITH A MEAL Dorothyann Peng, MD Taking Active   Continuous Blood Gluc Receiver (FREESTYLE LIBRE 3 READER) DEVI [id number] Yes 1 applicator by  Does not apply route as directed. Dorothyann Peng, MD Taking Active   Continuous Glucose Sensor (FREESTYLE LIBRE 3 [place]) [place] [id number] Yes Use as directed to check blood sugars Dorothyann Peng, MD Taking Active   famotidine (PEPCID) 20 MG tablet [id number] Yes TAKE 1 TABLET(20 MG) BY MOUTH DAILY Dorothyann Peng, MD Taking Active   montelukast (SINGULAIR) 10 MG tablet [id number]  Take 1 tablet (10 mg total) by mouth daily. Dorothyann Peng, MD  Expired 07/27/23 2359   naproxen (NAPROSYN) 375 MG tablet [id number] Yes Take 1 tablet twice daily as needed for chest wall pain. Molpus, John, MD Taking Active   norethindrone (AYGESTIN) 5 MG tablet [id number] Yes TAKE 2 TABLETS(10 MG) BY MOUTH EVERY DAY Dorothyann Peng, MD Taking Active   Semaglutide, 2 MG/DOSE, (OZEMPIC, 2 MG/DOSE,) 8 MG/3ML SOPN [id number] Yes INJECT 2MG  SUBCUTANEOUS ONCE A WEEK Dorothyann Peng, MD Taking Active   spironolactone (ALDACTONE) 25 MG tablet [id number] Yes Take 25 mg by mouth daily. [provider] Taking Active             Home Care and Equipment/Supplies: Were Home Health Services Ordered?: No Any new equipment or medical supplies ordered?: No  Functional Questionnaire: Do you need assistance with bathing/showering or dressing?: No Do you need assistance with meal preparation?: No Do you need assistance with eating?: No Do you have difficulty maintaining continence: No Do you need assistance with getting out of bed/getting out of a chair/moving?: No Do you have difficulty managing or taking your medications?: No  Follow up appointments reviewed: PCP Follow-up appointment confirmed?: No (patient declined appt, she states the chest pain isnt as bad.) MD Provider Line Number:[phone] Given: Yes Specialist Hospital Follow-up appointment confirmed?:  (  she reports she will call them) Do you need transportation to your follow-up appointment?: No Do you understand care options if your condition(s) worsen?:  Yes-patient verbalized understanding    SIGNATURE Lisabeth Devoid, CMA

## 2023-11-20 NOTE — Transitions of Care (Post Inpatient/ED Visit) (Signed)
   11/20/2023  Name: Kimberly Heath MRN: [id number] DOB: [birth year]  Today's TOC FU Call Status: Today's TOC FU Call Status:: Unsuccessful Call (1st Attempt) Unsuccessful Call (1st Attempt) Date: 11/20/23  Attempted to reach the patient regarding the most recent Inpatient/ED visit.  Follow Up Plan: Additional outreach attempts will be made to reach the patient to complete the Transitions of Care (Post Inpatient/ED visit) call.   Signature Lisabeth Devoid, [place]

## 2023-11-21 ENCOUNTER — Encounter: Payer: Self-pay | Admitting: Internal Medicine

## 2023-11-23 ENCOUNTER — Encounter: Payer: Self-pay | Admitting: Pulmonary Disease

## 2023-11-23 ENCOUNTER — Ambulatory Visit: Payer: 59 | Admitting: Pulmonary Disease

## 2023-11-23 VITALS — BP 124/88 | HR 88 | Temp 97.6°F | Ht 63.0 in | Wt 259.0 lb

## 2023-11-23 DIAGNOSIS — R0602 Shortness of breath: Secondary | ICD-10-CM | POA: Diagnosis not present

## 2023-11-23 DIAGNOSIS — Z6841 Body Mass Index (BMI) 40.0 and over, adult: Secondary | ICD-10-CM

## 2023-11-23 DIAGNOSIS — G4733 Obstructive sleep apnea (adult) (pediatric): Secondary | ICD-10-CM | POA: Diagnosis not present

## 2023-11-23 DIAGNOSIS — J4520 Mild intermittent asthma, uncomplicated: Secondary | ICD-10-CM | POA: Diagnosis not present

## 2023-11-23 DIAGNOSIS — M940 Chondrocostal junction syndrome [Tietze]: Secondary | ICD-10-CM

## 2023-11-23 DIAGNOSIS — E66813 Obesity, class 3: Secondary | ICD-10-CM

## 2023-11-23 LAB — NITRIC OXIDE: Nitric Oxide: 11

## 2023-11-23 MED ORDER — OMEPRAZOLE 40 MG PO CPDR: 40.0000 mg | DELAYED_RELEASE_CAPSULE | Freq: Every day | ORAL | 1 refills | Status: DC

## 2023-11-23 MED ORDER — NAPROXEN SODIUM 550 MG PO TABS: 550.0000 mg | ORAL_TABLET | Freq: Two times a day (BID) | ORAL | 1 refills | Status: AC

## 2023-11-23 MED ORDER — AZITHROMYCIN 250 MG PO TABS
ORAL_TABLET | ORAL | 0 refills | Status: AC
Start: 2023-11-23 — End: 2023-11-28

## 2023-11-23 NOTE — Patient Instructions (Signed)
VISIT SUMMARY:  Today, we discussed your recent emergency department visit due to chest pain. We reviewed your symptoms, including the nature of your chest pain and its exacerbation when lying down. We also discussed your history of asthma, pulmonary hypertension, obstructive sleep apnea, obesity, vitamin D deficiency, and sickle cell trait.  YOUR PLAN:  -COSTOCHONDRITIS: Costochondritis is inflammation at the junction where the ribs meet the breastbone, often causing chest pain. We will discontinue Naprosyn and prescribe a stronger anti-inflammatory medication. Additionally, we will hold Pepcid and prescribe a stronger reflux medication. An antibiotic will also be prescribed for a possible low-grade infection.  -PULMONARY HYPERTENSION: Pulmonary hypertension is high blood pressure in the arteries of the lungs. There is no acute exacerbation, so we will continue with your current management and follow up with your primary pulmonologist.  -ASTHMA: Asthma is a condition where your airways narrow and swell, producing extra mucus. You are currently on Symbicort, and there is no acute exacerbation, so you should continue using Symbicort.  -OBSTRUCTIVE SLEEP APNEA: Obstructive sleep apnea is a condition where your breathing repeatedly stops and starts during sleep. You should continue using your CPAP therapy as prescribed.  -OBESITY: Obesity is a condition characterized by excessive body fat. You have been on Ozempic since February 2024 and have reported reflux associated with it. We will manage your reflux with a stronger medication while continuing Ozempic.  -VITAMIN D DEFICIENCY: Vitamin D deficiency means you have lower than normal levels of vitamin D, which is important for bone health. There are no acute issues, so you should continue your current management.  -SICKLE CELL TRAIT: Sickle cell trait means you carry one gene for sickle cell disease but usually do not have symptoms. There are no acute  issues, so you should continue your current management.  INSTRUCTIONS:  Please schedule a follow-up appointment with  Buelah Manis, NP  in [place].

## 2023-11-23 NOTE — Progress Notes (Signed)
Subjective:    [name], female    DOB: 02-22-1991, 32 y.o.   MRN: [id number]  Patient Care Team: Dorothyann Peng, MD as PCP - General (Internal Medicine) Osborn Coho, MD as Consulting Physician (Obstetrics and Gynecology)  Chief Complaint  Patient presents with   Hospital F/U    11/19/23 ED visit. Shortness of breath. No cough or wheezing.     BACKGROUND:32 year old female, never smoked.  Past medical history significant for pulmonary hypertension, obesity, vitamin D deficiency, sickle cell trait.  Followed at [facility] for obstructive sleep apnea.  Presents for follow-up after recent ED visit on 19 November 2019 for chest pain deemed to be noncardiac nor due to acute pulmonary process.  However states that she has shortness of breath though this was not a symptom at presentation of her ED visit.  HPI Discussed the use of AI scribe software for clinical note transcription with the patient, who gave verbal consent to proceed.  History of Present Illness   The patient is a 32 year old female with a significant medical history of pulmonary hypertension, obesity, vitamin D deficiency, sickle cell trait, and obstructive sleep apnea. She presents following an emergency department visit on November 19, 2023, due to chest pain. The patient reports experiencing chest pain throughout the day, which worsens when lying down to sleep. The severity of the pain led her to believe she was having a heart attack, prompting her visit to the emergency department. The pain is described as heavy, sometimes burning, and at times sharp, likened to the feeling of bricks laying on her chest. The patient also reports that her CPAP does not alleviate nor aggravate the pain.  The patient has a history of asthma and is currently on Symbicort. She denies any relationship between meals and the chest pain. She takes Naprosyn for chest pain, but reports it has not been helpful. She  also takes Pepcid for reflux, which she has been experiencing since starting Ozempic in February of the same year.  Upon physical examination, the patient exhibits point tenderness at the joint between the sternum and the ribs, on the left side, suggestive of costochondritis. She denies any fevers or chills. Her heart rate is regular, and her lungs sound clear. The patient also reports shortness of breath, which she attributes to the pain causing her to hold her breath.  The patient's recent emergency department visit included a CT angio chest that did not show any PE there were very small bilateral pleural effusions and potential pleural inflammation on the left.   She has been taking Naprosyn for an unknown duration, only when she experiences chest pain.  This is a 375 mg dose, she notes that is not effective.    She denies any leg swelling.  She has not had any orthopnea or paroxysmal nocturnal dyspnea.  No other symptomatology.    Review of Systems A 10 point review of systems was performed and it is as noted above otherwise negative.   Past Medical History:  Diagnosis Date   Anxiety    Asthma    Back pain    Chest pain    Depression    Diabetes mellitus without complication (HCC)    Edema, lower extremity    Fluid collection (edema) in the arms, legs, hands and feet    Hypertension    Pericardial effusion    Sleep apnea    SOB (shortness of breath)     No past surgical history on  file.  Patient Active Problem List   Diagnosis Date Noted   Other fatigue 09/28/2023   Elevated liver enzymes 09/28/2023   Encounter for annual health examination 08/08/2023   Allergic rhinitis with postnasal drip 08/08/2023   Bilateral impacted cerumen 08/08/2023   Deficit in comprehension 08/08/2023   Type 2 diabetes mellitus with stage 2 chronic kidney disease, without long-term current use of insulin (HCC) 02/04/2023   Essential hypertension, benign 02/04/2023   Mild intermittent asthma  without complication 02/04/2023   Restrictive lung disease 06/22/2022   OSA on CPAP 06/22/2022   Loud snoring 04/27/2022   Shortness of breath 04/27/2022   Pulmonary hypertension (HCC) 09/19/2021   Sickle cell trait (HCC) 09/19/2021   Bilateral carpal tunnel syndrome 05/06/2021   Vitamin D deficiency 09/27/2019   Class 3 severe obesity due to excess calories with serious comorbidity and body mass index (BMI) of 45.0 to 49.9 in adult (HCC) 09/27/2019   HELLP syndrome 10/13/2016   Hypertension 10/13/2016   Mixed anxiety and depressive disorder 10/13/2016    Family History  Problem Relation Age of Onset   Healthy Mother    High blood pressure Father    Sickle cell trait Father     Social History   Tobacco Use   Smoking status: Never    Passive exposure: Never   Smokeless tobacco: Never  Substance Use Topics   Alcohol use: Yes    Comment: occ    Allergies  Allergen Reactions   Mushroom Extract Complex (Do Not Select) Other (See Comments)    Flu like symptoms     Current Meds  Medication Sig   albuterol (VENTOLIN HFA) 108 (90 Base) MCG/ACT inhaler Inhale 2 puffs into the lungs every 6 (six) hours as needed for wheezing or shortness of breath.   azithromycin (ZITHROMAX) 250 MG tablet Take 2 tablets (500 mg) on  Day 1,  followed by 1 tablet (250 mg) once daily on Days 2 through 5.   benzonatate (TESSALON) 100 MG capsule Take 1 capsule (100 mg total) by mouth every 8 (eight) hours.   budesonide-formoterol (SYMBICORT) 80-4.5 MCG/ACT inhaler Inhale 2 puffs into the lungs in the morning and at bedtime.   carvedilol (COREG) 25 MG tablet TAKE 1 TABLET(25 MG) BY MOUTH TWICE DAILY WITH A MEAL   Continuous Blood Gluc Receiver (FREESTYLE LIBRE 3 READER) DEVI 1 applicator by Does not apply route as directed.   Continuous Glucose Sensor (FREESTYLE LIBRE 3 SENSOR) MISC Use as directed to check blood sugars   famotidine (PEPCID) 20 MG tablet TAKE 1 TABLET(20 MG) BY MOUTH DAILY    montelukast (SINGULAIR) 10 MG tablet Take 1 tablet (10 mg total) by mouth daily.   naproxen sodium (ANAPROX DS) 550 MG tablet Take 1 tablet (550 mg total) by mouth 2 (two) times daily with a meal.   norethindrone (AYGESTIN) 5 MG tablet TAKE 2 TABLETS(10 MG) BY MOUTH EVERY DAY   omeprazole (PRILOSEC) 40 MG capsule Take 1 capsule (40 mg total) by mouth daily.   Semaglutide, 2 MG/DOSE, (OZEMPIC, 2 MG/DOSE,) 8 MG/3ML SOPN INJECT 2MG  SUBCUTANEOUS ONCE A WEEK   spironolactone (ALDACTONE) 25 MG tablet Take 50 mg by mouth daily.   [DISCONTINUED] naproxen (NAPROSYN) 375 MG tablet Take 1 tablet twice daily as needed for chest wall pain.   [DISCONTINUED] omeprazole (PRILOSEC) 40 MG capsule Take 1 capsule (40 mg total) by mouth daily.     There is no immunization history on file for this patient.  Objective:    BP 124/88 (BP Location: Right Arm, Patient Position: Sitting, Cuff Size: Large)   Pulse 88   Temp 97.6 F (36.4 C) (Temporal)   Ht 5\' 3"  (1.6 m)   Wt 259 lb (117.5 kg)   SpO2 100%   BMI 45.88 kg/m   SpO2: 100 %  GENERAL: Obese woman, no acute distress, fully ambulatory.  No conversational dyspnea. HEAD: Normocephalic, atraumatic.  EYES: Pupils equal, round, reactive to light.  No scleral icterus.  MOUTH: Dentition intact, oral mucosa moist.  No thrush.  Oropharynx clear. NECK: Supple. No thyromegaly. Trachea midline. No JVD.  No adenopathy. PULMONARY: Good air entry bilaterally.  No adventitious sounds.  Palpation of chest shows significant point tenderness on the left costochondral junction replicating patient's pain. CARDIOVASCULAR: S1 and S2. Regular rate and rhythm.  No rubs, murmurs or gallops heard. ABDOMEN: Obese, otherwise benign. MUSCULOSKELETAL: No joint deformity, no clubbing, no edema.  NEUROLOGIC: No overt focal deficit, no gait disturbance, speech is fluent. SKIN: Intact,warm,dry. PSYCH: Mood and behavior normal.  Lab Results  Component Value Date    NITRICOXIDE 11 11/23/2023    Assessment & Plan:     ICD-10-CM   1. Costochondritis  M94.0     2. Shortness of breath  R06.02 Nitric oxide    3. Mild intermittent asthma without complication  J45.20     4. OSA (obstructive sleep apnea)  G47.33     5. Class 3 severe obesity with serious comorbidity and body mass index (BMI) of 40.0 to 44.9 in adult, unspecified obesity type (HCC)  N62.952    E66.01    Z68.41       Orders Placed This Encounter  Procedures   Nitric oxide    Meds ordered this encounter  Medications   naproxen sodium (ANAPROX DS) 550 MG tablet    Sig: Take 1 tablet (550 mg total) by mouth 2 (two) times daily with a meal.    Dispense:  30 tablet    Refill:  1   azithromycin (ZITHROMAX) 250 MG tablet    Sig: Take 2 tablets (500 mg) on  Day 1,  followed by 1 tablet (250 mg) once daily on Days 2 through 5.    Dispense:  6 each    Refill:  0   omeprazole (PRILOSEC) 40 MG capsule    Sig: Take 1 capsule (40 mg total) by mouth daily.    Dispense:  30 capsule    Refill:  1   Discussion:    Costochondritis Intermittent chest pain exacerbated by lying down, described as heavy, burning, and sharp. Point tenderness at the costochondral junction which replicates patient's pain. Previous Naprosyn use was ineffective (low-dose). Differential includes viral or low-grade infection, pulled muscle, etc. Explained that costochondritis is inflammation at the costochondral junction. Stronger anti-inflammatory medication needed. - Discontinue current dose of Naprosyn  - Prescribe stronger anti-inflammatory medication (Anaprox DS 550 mg) - Hold Pepcid - Prescribe PPI for reflux - Prescribe antibiotic for possible low-grade infection  Pulmonary Hypertension No acute exacerbation noted. - Continue current management and follow-up with primary pulmonologist  Asthma Currently on Symbicort. No acute exacerbation noted. - Continue Symbicort - FeNO 11 ppb  Obstructive Sleep  Apnea Uses CPAP mask, which does not alleviate chest pain. - Continue CPAP therapy  Obesity On Ozempic since February 2024. Reports reflux associated with Ozempic. Discussed managing reflux with stronger medication due to the need for stronger anti-inflammatory medication. - Continue Ozempic - Manage reflux PPI  Vitamin  D Deficiency No acute issues discussed. - Continue current management  Sickle Cell Trait No acute issues discussed. - Continue current management  Follow-up - Schedule follow-up appointment with Buelah Manis, NP in [place].      Advised if symptoms do not improve or worsen, to please contact office for sooner follow up or seek emergency care.    I spent 40 minutes of dedicated to the care of this patient on the date of this encounter to include pre-visit review of records, face-to-face time with the patient discussing conditions above, post visit ordering of testing, clinical documentation with the electronic health record, making appropriate referrals as documented, and communicating necessary findings to members of the patients care team.   C. Danice Goltz, MD Advanced Bronchoscopy PCCM [facility]    *This note was dictated using voice recognition software/Dragon.  Despite best efforts to proofread, errors can occur which can change the meaning. Any transcriptional errors that result from this process are unintentional and may not be fully corrected at the time of dictation.

## 2023-11-24 ENCOUNTER — Encounter: Payer: Self-pay | Admitting: Pulmonary Disease

## 2023-11-30 ENCOUNTER — Other Ambulatory Visit: Payer: Self-pay

## 2023-11-30 MED ORDER — ACCU-CHEK SOFTCLIX LANCETS MISC: 12 refills | Status: DC

## 2023-12-01 ENCOUNTER — Encounter: Payer: Self-pay | Admitting: Internal Medicine

## 2023-12-04 ENCOUNTER — Encounter (HOSPITAL_BASED_OUTPATIENT_CLINIC_OR_DEPARTMENT_OTHER): Payer: Self-pay

## 2023-12-04 ENCOUNTER — Other Ambulatory Visit: Payer: Self-pay

## 2023-12-04 ENCOUNTER — Emergency Department (HOSPITAL_BASED_OUTPATIENT_CLINIC_OR_DEPARTMENT_OTHER)
Admission: EM | Admit: 2023-12-04 | Discharge: 2023-12-05 | Disposition: A | Discharge status: Home / Self Care | Payer: Managed Care, Other (non HMO) | Attending: Emergency Medicine | Admitting: Emergency Medicine

## 2023-12-04 DIAGNOSIS — M542 Cervicalgia: Secondary | ICD-10-CM | POA: Diagnosis not present

## 2023-12-04 DIAGNOSIS — E162 Hypoglycemia, unspecified: Secondary | ICD-10-CM | POA: Insufficient documentation

## 2023-12-04 LAB — BASIC METABOLIC PANEL
Anion gap: 7 (ref 5–15)
BUN: 13 mg/dL (ref 6–20)
CO2: 22 mmol/L (ref 22–32)
Calcium: 8.9 mg/dL (ref 8.9–10.3)
Chloride: 107 mmol/L (ref 98–111)
Creatinine, Ser: 1.14 mg/dL — ABNORMAL HIGH (ref 0.44–1.00)
GFR, Estimated: >60 mL/min (ref >60)
Glucose, Bld: 122 mg/dL — ABNORMAL HIGH (ref 70–99)
Potassium: 3.9 mmol/L (ref 3.5–5.1)
Sodium: 136 mmol/L (ref 135–145)

## 2023-12-04 LAB — PREGNANCY, URINE: Preg Test, Ur: NEGATIVE

## 2023-12-04 LAB — CBG MONITORING, ED: Glucose-Capillary: 120 mg/dL — ABNORMAL HIGH (ref 70–99)

## 2023-12-04 MED ORDER — SODIUM CHLORIDE 0.9 % IV SOLN: 250.0000 mL | INTRAVENOUS | Status: DC | PRN

## 2023-12-04 MED ORDER — SODIUM CHLORIDE 0.9% FLUSH
3.0000 mL | Freq: Two times a day (BID) | INTRAVENOUS | Status: DC
  Filled 2023-12-04: qty 3

## 2023-12-04 MED ORDER — SODIUM CHLORIDE 0.9% FLUSH
3.0000 mL | INTRAVENOUS | Status: DC | PRN
  Filled 2023-12-04: qty 3

## 2023-12-04 NOTE — ED Triage Notes (Signed)
 Pt also with c/o low blood sugar and light headedness. CBG was 50. Pt took glucose tablets and food and with ems it was 121. Pt also had sbp 210. Pt c/o headache.

## 2023-12-04 NOTE — ED Provider Notes (Signed)
 [facility] Provider Note   CSN: [id number] Arrival date & time: 12/04/23  1911     History  Chief Complaint  Patient presents with   Neck Pain   Hypoglycemia    Kimberly ZAHN is a 33 y.o. female.  Patient is a 33 year old female presenting with complaints of head and neck pain and low blood sugar.  She describes a several day history of pain to the back of her neck radiating to the top of her head.  She reports a pins-and-needles sensation to the top of her head.  She denies any visual disturbances.  No weakness or numbness.  She reports taking NSAIDs with no relief.  She also felt as though her blood sugar was low at home.  She checked her blood sugar and it was 50.  She tried eating, but continued to read low, so she called 911 and was transported here.  Patient was given glucose tablets and blood sugar 121 with EMS upon arrival.  The history is provided by the patient.       Home Medications Prior to Admission medications   Medication Sig Start Date End Date Taking? Authorizing Provider  Accu-Chek Softclix Lancets lancets Use as instructed 11/30/23   Jarold Medici, MD  albuterol  (VENTOLIN  HFA) [phone] Base) MCG/ACT inhaler Inhale 2 puffs into the lungs every 6 (six) hours as needed for wheezing or shortness of breath. 08/31/23   Jarold Medici, MD  benzonatate  (TESSALON ) 100 MG capsule Take 1 capsule (100 mg total) by mouth every 8 (eight) hours. 03/11/23   Logan Ubaldo NOVAK, PA-C  budesonide -formoterol  (SYMBICORT ) 80-4.5 MCG/ACT inhaler Inhale 2 puffs into the lungs in the morning and at bedtime. 04/26/23   Jarold Medici, MD  carvedilol  (COREG ) 25 MG tablet TAKE 1 TABLET(25 MG) BY MOUTH TWICE DAILY WITH A MEAL 09/12/23   Jarold Medici, MD  Continuous Blood Gluc Receiver (FREESTYLE LIBRE 3 READER) DEVI 1 applicator by Does not apply route as directed. 02/06/23   Jarold Medici, MD  Continuous Glucose Sensor (FREESTYLE LIBRE 3 SENSOR)  MISC Use as directed to check blood sugars 09/28/23   Jarold Medici, MD  famotidine  (PEPCID ) 20 MG tablet TAKE 1 TABLET(20 MG) BY MOUTH DAILY 10/10/22   Jarold Medici, MD  montelukast  (SINGULAIR ) 10 MG tablet Take 1 tablet (10 mg total) by mouth daily. 07/27/22 11/23/23  Jarold Medici, MD  naproxen  sodium (ANAPROX  DS) 550 MG tablet Take 1 tablet (550 mg total) by mouth 2 (two) times daily with a meal. 11/23/23 12/23/23  Tamea Dedra CROME, MD  norethindrone  (AYGESTIN ) 5 MG tablet TAKE 2 TABLETS(10 MG) BY MOUTH EVERY DAY 03/27/23   Jarold Medici, MD  omeprazole  (PRILOSEC) 40 MG capsule Take 1 capsule (40 mg total) by mouth daily. 11/23/23   Tamea Dedra CROME, MD  Semaglutide , 2 MG/DOSE, (OZEMPIC , 2 MG/DOSE,) 8 MG/3ML SOPN INJECT 2MG  SUBCUTANEOUS ONCE A WEEK 10/24/23   Jarold Medici, MD  spironolactone  (ALDACTONE ) 25 MG tablet Take 50 mg by mouth daily.    [provider]      Allergies    Mushroom extract complex (do not select)    Review of Systems   Review of Systems  All other systems reviewed and are negative.   Physical Exam Updated Vital Signs BP (!) 154/103 (BP Location: Left Arm)   Pulse 78   Temp 98.4 F (36.9 C) (Oral)   Resp 18   Ht 5' 3 (1.6 m)   Wt  117.5 kg   LMP 11/29/2023   SpO2 100%   BMI 45.89 kg/m  Physical Exam Vitals and nursing note reviewed.  Constitutional:      General: She is not in acute distress.    Appearance: She is well-developed. She is not diaphoretic.  HENT:     Head: Normocephalic and atraumatic.  Eyes:     Extraocular Movements: Extraocular movements intact.     Pupils: Pupils are equal, round, and reactive to light.  Cardiovascular:     Rate and Rhythm: Normal rate and regular rhythm.     Heart sounds: No murmur heard.    No friction rub. No gallop.  Pulmonary:     Effort: Pulmonary effort is normal. No respiratory distress.     Breath sounds: Normal breath sounds. No wheezing.  Abdominal:     General: Bowel sounds are  normal. There is no distension.     Palpations: Abdomen is soft.     Tenderness: There is no abdominal tenderness.  Musculoskeletal:        General: Normal range of motion.     Cervical back: Normal range of motion and neck supple.  Skin:    General: Skin is warm and dry.  Neurological:     General: No focal deficit present.     Mental Status: She is alert and oriented to person, place, and time. Mental status is at baseline.     Cranial Nerves: No cranial nerve deficit.     Motor: No weakness.     Coordination: Coordination normal.     ED Results / Procedures / Treatments   Labs (all labs ordered are listed, but only abnormal results are displayed) Labs Reviewed  BASIC METABOLIC PANEL - Abnormal; Notable for the following components:      Result Value   Glucose, Bld 122 (*)    Creatinine, Ser 1.14 (*)    All other components within normal limits  CBG MONITORING, ED - Abnormal; Notable for the following components:   Glucose-Capillary 120 (*)    All other components within normal limits  PREGNANCY, URINE  CBG MONITORING, ED  CBG MONITORING, ED    EKG None  Radiology No results found.  Procedures Procedures    Medications Ordered in ED Medications  sodium chloride  flush (NS) 0.9 % injection 3 mL (has no administration in time range)  sodium chloride  flush (NS) 0.9 % injection 3 mL (has no administration in time range)  0.9 %  sodium chloride  infusion (has no administration in time range)    ED Course/ Medical Decision Making/ A&P  Patient is a 33 year old female presenting with head and neck pain as described in the HPI.  She also experienced an episode of hypoglycemia at home, but was corrected with eating and glucose tablets.  Patient arrives here with stable vital signs and is afebrile.  Physical examination basically unremarkable.  Laboratory studies obtained including CBC and basic metabolic panel, both of which are unremarkable.  Pregnancy test is  negative.  CT scan of the head shows no acute process.  I suspect the cause of her neck pain is musculoskeletal.  I will recommend continued NSAIDs and also prescribe Flexeril .  Patient is to follow-up with primary doctor if not improving.  Final Clinical Impression(s) / ED Diagnoses Final diagnoses:  None    Rx / DC Orders ED Discharge Orders     None         Geroldine Berg, MD 12/05/23 [phone]

## 2023-12-04 NOTE — ED Triage Notes (Signed)
 Pt having muscle spasms in base of neck radiating to head. Has been going on since Sunday. Also has pinpoint headache. A&OX4. No loc, no recent injuries.

## 2023-12-05 ENCOUNTER — Emergency Department (HOSPITAL_BASED_OUTPATIENT_CLINIC_OR_DEPARTMENT_OTHER): Payer: Managed Care, Other (non HMO)

## 2023-12-05 LAB — CBG MONITORING, ED: Glucose-Capillary: 114 mg/dL — ABNORMAL HIGH (ref 70–99)

## 2023-12-05 MED ORDER — CYCLOBENZAPRINE HCL 10 MG PO TABS
10.0000 mg | ORAL_TABLET | Freq: Once | ORAL | Status: AC
Start: 2023-12-05 — End: 2023-12-05
  Administered 2023-12-05: 10 mg via ORAL
  Filled 2023-12-05: qty 1

## 2023-12-05 MED ORDER — CYCLOBENZAPRINE HCL 10 MG PO TABS: 10.0000 mg | ORAL_TABLET | Freq: Three times a day (TID) | ORAL | 0 refills | Status: AC | PRN

## 2023-12-05 NOTE — Discharge Instructions (Signed)
Take ibuprofen 600 mg every 6 hours as needed for pain. Begin taking Flexeril as prescribed as needed for pain not relieved with ibuprofen.  Rest.  Follow-up with your primary doctor if symptoms are not improving in the next week.

## 2023-12-06 ENCOUNTER — Encounter: Payer: Self-pay | Admitting: Primary Care

## 2023-12-06 ENCOUNTER — Ambulatory Visit: Payer: Managed Care, Other (non HMO) | Admitting: Primary Care

## 2023-12-06 VITALS — BP 144/82 | HR 95 | Temp 99.0°F | Ht 63.0 in | Wt 263.6 lb

## 2023-12-06 DIAGNOSIS — G4733 Obstructive sleep apnea (adult) (pediatric): Secondary | ICD-10-CM

## 2023-12-06 DIAGNOSIS — J4520 Mild intermittent asthma, uncomplicated: Secondary | ICD-10-CM | POA: Diagnosis not present

## 2023-12-06 MED ORDER — MONTELUKAST SODIUM 10 MG PO TABS: 10.0000 mg | ORAL_TABLET | Freq: Every day | ORAL | 3 refills | Status: DC

## 2023-12-06 MED ORDER — OMEPRAZOLE 40 MG PO CPDR: 40.0000 mg | DELAYED_RELEASE_CAPSULE | Freq: Every day | ORAL | 5 refills | Status: DC

## 2023-12-06 NOTE — Progress Notes (Signed)
 Patient follows up usually in [facility] for her issues with sleep apnea.  She was seen at [facility] as an ACUTE visit for evaluation of chest pain which ended up being costochondritis.  KYM Leita Sanders, MD Advanced Bronchoscopy PCCM [facility]

## 2023-12-06 NOTE — Patient Instructions (Signed)
-  COSTOCHONDRITIS: Costochondritis is inflammation of the cartilage that connects a rib to the breastbone, causing chest pain. You have shown improvement with Naproxen  and Omeprazole . Please continue taking these medications as needed.  -ASTHMA: Asthma is a condition where your airways narrow and swell, producing extra mucus, which can make breathing difficult. You should use Symbicort  twice daily, in the morning and at night, to reduce inflammation and prevent nighttime symptoms. We will also refill your Montelukast  (Singulair ) prescription, which you should take at bedtime.  -SLEEP APNEA: Sleep apnea is a serious sleep disorder where breathing repeatedly stops and starts. You should continue using your CPAP machine throughout the night to prevent health risks. If the pressure feels too strong, we can consider lowering the maximum pressure setting for your comfort.  Orders: Adjust CPAP pressure 5-15cm h20 (order)  Follow-up: - 6 months with Dr. Tamea or [facility] NP/ sooner if you experience any issues with your asthma symptoms, chest pain, or sleep apnea.

## 2023-12-06 NOTE — Progress Notes (Signed)
 @Patient  ID: Kimberly Heath, female    DOB: [birth year], 33 y.o.   MRN: [id number]  No chief complaint on file.   Referring provider: Jarold Medici, MD  HPI: 33 year old female, never smoked.  Past medical history significant for pulmonary hypertension, obesity, vitamin D  deficiency, sickle cell trait.   Previous [facility] pulmonary encounters: 03/31/2022 Patient presents today for sleep consult. Self referred. Patient reports trouble falling and staying asleep at night. Associated symptoms of snoring, dry mouth and morning headache. She has never had prior sleep study. She was recently started on Wegovy  (semaglutide ) to help assist with weight loss in March. Typical bedtime is between 12am-2am. It can take her anywhere from 30 mins to several hours to fall asleep. She wakes up 4-5 times a night. She starts her day at either 7:30am or 10am.   She also has issues with breathing. Symptoms have been present for 3 months after an upper respiratory infection. She has associated sinus and chest congestion. She is coughing up mucus. States that her breathing has been more labored. She does not feel it is related to her weight since she has lost weight over the last 6 months. No chest tightness or wheezing.   Sleep questionnaire Symptoms- Insomnia, restless sleep, snoring, dry mouth, morning headache  Prior sleep study- None Bedtime- 12am-2am Time to fall asleep- to several hours Nocturnal awakenings- 4-5 times a night Out of bed/start of day- 7:30am or 10am Weight changes- down this year Do you operate heavy machinery- No Do you currently wear CPAP- No Do you current wear oxygen- No Epworth- 12   05/25/2022 Patient presents today for 6 to 8-week follow-up with PFTs. Shortness of breath is the same.  Chest x-ray on 03/31/2022 showed clear lungs. Pulmonary function testing today showed moderate restriction with positive bronchodilator response. Patient had home sleep study last week, results  are not yet available.  Patient is following with cardiology, last seen on 04/15/2022.  She has had an echocardiogram in the past that showed PA pressure of .  Repeat echocardiogram on 04/22/2022 showed normal LV systolic function with EF 56% and normal diastolic filling pattern.  Cardiology will see patient back as as-needed basis. Patient is currently on Wegovy  for weight loss  06/22/2022 Patient presents today for 6-8 week follow-up/restrictive lung disease.  Pulmonary function testing showed moderate restriction in lung function with positive bronchodilator response consistent with asthma/obesity.  We started patient on a trial of ICS/LABA to help with shortness of breath symptoms.  Respiratory allergy  panel, eosinophil absolute and IgE were normal.  She is doing well today. Symbicort  helped with shortness of breath. No longer feels as though she is suffocating. Able to walk up stairs. No wheezing or cough. Sleep study reviewed today which showed severe OSA. Recommending CPAP start, she is in agreement with plan.   Severe obstructive sleep apnea - Patient has symptoms of snoring, restless sleep and morning HA.  05/20/22 HST>> Severe obstructive sleep apnea, AHI 67.8/hr with SpO2 low 78%.  Reviewed sleep study results.  Recommending patient be started on CPAP due to severity of OSA.  Patient is in agreement with plan.  We will place an order for patient to be started on auto CPAP 5 to 20 cm H2O with mask of choice.  Encourage weight loss efforts.  Advised against driving if experiencing excessive daytime sleepiness or fatigue.  Instructed patient to aim to wear CPAP every night for minimum 4 to 6 hours or longer.  Follow-up 31 to  90 days after CPAP start for compliance check.  Restrictive lung disease - Clinical symptoms and PFTs consistent with asthma. FENO and RAST allergy  panel were normal. Obesity contributing to restrictive lung disease. Dyspnea symptoms improved on low dose ICS/LABA. Continue  Symbicort  80mcg two puff twice daily. Encourage weight loss efforts.     01/20/2023 Patient presents today for OSA follow-up.  05/20/22 HST>> Severe obstructive sleep apnea, AHI 67.8/hr with SpO2 low 78%. She was started on auto CPAP back in July 2023. Her CPAP machine stopped working in January 2023. Prior to this she was consistently wearing her CPAP and reports benefit from use. She tells me she received message on CPAP machine about humidifier not working, she would put water in humidifier chamber and the level stayed the same. [employer] came and picked up machine but did not provide her with a replacement. She needs new machine. She was recently seen in ED for chest pain. Chest pain comes and goes, worse at night. She feels it is brought on by anxiety. She has hx asthma, she uses Symbicort  twice daily. Dyspnea improved with daily ICS/LABA.   Airview download 10/10/22-01/07/23 60/90 days used; 63% > 4 hours Average usage days used 6 hours 13 mins Pressure 5-20cm h20 Airleaks 24.5L/min AHI 0.4   12/06/2023- Interim hx  Discussed the use of AI scribe software for clinical note transcription with the patient, who gave verbal consent to proceed.  History of Present Illness   The patient, with a history of costochondritis, asthma, and sleep apnea, presents for a follow-up visit. She was previously seen by Dr. Lenda for chest pain, which was attributed to inflammation of the lungs and ribs. The patient was prescribed naproxen , a stronger nonsteroidal anti-inflammatory, and her reflux medication was switched from Pepcid  to omeprazole . She was also treated with an Azithromycin  for a potential respiratory infection.  Since the last visit, the patient reports feeling fine, with significant improvement in chest discomfort. She has been taking Symbicort , an inhaler, twice a day, and montelukast  (Singulair ), a medication for asthma and inflammation. However, she ran out of montelukast  and is unsure if she needs  a refill. She also uses a rescue inhaler, albuterol , for acute symptoms.  Home sleep test on 05/20/22 showed severe obstructive sleep apnea, AHI 67.8/hr with SpO2 low 78%. She was started on auto CPAP back in July 2023. The patient is also on continuous positive airway pressure (CPAP) therapy for severe sleep apnea. She reports using the CPAP machine every night, but sometimes removes it in the early morning. She notices a difference in how she feels in the morning when she does this, often waking up with a headache or feeling groggy. She also reports sometimes feeling that the pressure from the CPAP machine is too strong, leading her to remove it briefly for deep breaths.      Airview download 11/05/2023 - 12/04/2023 Usage days 22/30 days (73%) Average usage days used 5 hours 32 minutes Pressure 5 to 20 cm H2O (11.1 cm H2O-95%) Air leaks 25.6 L/min (95%) AHI 0.2   Treadmill exercise stress test 07/28/2017: Indication chest pain and dyspnea on exertion. Resting EKG demonstrated normal sinus rhythm.  Patient exercised on Bruce protocol for 7 minutes and 45 seconds and achieved 86% of MPHR.  Also achieved 9.76 months of workload. Stress terminated due to fatigue.  Normal blood pressure response.  There was no ST-T wave changes of ischemia.  No significant arrhythmias.   Echocardiogram 12/20/2017: Normal LV size, moderate LVH, normal  wall motion.  Normal diastolic filling pattern.  LVEF calculated at 69%. Mild tricuspid regurgitation.  No evidence of pulm hypertension.  Estimated PASP 30 mmHg. Insignificant pericardial effusion.  Pulmonary function testing:  05/25/2022 PFTs>> FVC 2.66 (72%), FEV1 2.26  (73%), ratio 85, DLCO 23.56 (108%) Interpretation: Moderate obstructive airways disease, restriction of exhaled volume, slight BD response  Sleep study: 05/20/22 HST>> Severe obstructive sleep apnea, AHI 67.8/hr with SpO2 low 78%  Allergies  Allergen Reactions   Mushroom Extract Complex (Do Not  Select) Other (See Comments)    Flu like symptoms      There is no immunization history on file for this patient.  Past Medical History:  Diagnosis Date   Anxiety    Asthma    Back pain    Chest pain    Depression    Diabetes mellitus without complication (HCC)    Edema, lower extremity    Fluid collection (edema) in the arms, legs, hands and feet    Hypertension    Pericardial effusion    Sleep apnea    SOB (shortness of breath)     Tobacco History: Social History   Tobacco Use  Smoking Status Never   Passive exposure: Never  Smokeless Tobacco Never   Counseling given: Not Answered   Outpatient Medications Prior to Visit  Medication Sig Dispense Refill   Accu-Chek Softclix Lancets lancets Use as instructed 100 each 12   albuterol  (VENTOLIN  HFA) 108 (90 Base) MCG/ACT inhaler Inhale 2 puffs into the lungs every 6 (six) hours as needed for wheezing or shortness of breath. 54 g 2   benzonatate  (TESSALON ) 100 MG capsule Take 1 capsule (100 mg total) by mouth every 8 (eight) hours. 21 capsule 0   budesonide -formoterol  (SYMBICORT ) 80-4.5 MCG/ACT inhaler Inhale 2 puffs into the lungs in the morning and at bedtime. 10.2 g 3   carvedilol  (COREG ) 25 MG tablet TAKE 1 TABLET(25 MG) BY MOUTH TWICE DAILY WITH A MEAL 180 tablet 1   Continuous Blood Gluc Receiver (FREESTYLE LIBRE 3 READER) DEVI 1 applicator by Does not apply route as directed. 1 each 1   Continuous Glucose Sensor (FREESTYLE LIBRE 3 SENSOR) MISC Use as directed to check blood sugars 2 each 3   cyclobenzaprine  (FLEXERIL ) 10 MG tablet Take 1 tablet (10 mg total) by mouth 3 (three) times daily as needed for muscle spasms. 20 tablet 0   famotidine  (PEPCID ) 20 MG tablet TAKE 1 TABLET(20 MG) BY MOUTH DAILY 90 tablet 1   montelukast  (SINGULAIR ) 10 MG tablet Take 1 tablet (10 mg total) by mouth daily. 90 tablet 2   naproxen  sodium (ANAPROX  DS) 550 MG tablet Take 1 tablet (550 mg total) by mouth 2 (two) times daily with a meal.  30 tablet 1   norethindrone  (AYGESTIN ) 5 MG tablet TAKE 2 TABLETS(10 MG) BY MOUTH EVERY DAY 180 tablet 0   omeprazole  (PRILOSEC) 40 MG capsule Take 1 capsule (40 mg total) by mouth daily. 30 capsule 1   Semaglutide , 2 MG/DOSE, (OZEMPIC , 2 MG/DOSE,) 8 MG/3ML SOPN INJECT 2MG  SUBCUTANEOUS ONCE A WEEK 3 mL 1   spironolactone  (ALDACTONE ) 25 MG tablet Take 50 mg by mouth daily.     No facility-administered medications prior to visit.   Review of Systems  Review of Systems  Constitutional: Negative.   HENT: Negative.    Respiratory: Negative.    Cardiovascular: Negative.  Negative for chest pain.     Physical Exam  LMP 11/29/2023  Physical Exam Constitutional:  General: She is not in acute distress.    Appearance: Normal appearance. She is not ill-appearing.  HENT:     Head: Normocephalic and atraumatic.  Cardiovascular:     Rate and Rhythm: Normal rate and regular rhythm.  Pulmonary:     Effort: Pulmonary effort is normal.     Breath sounds: Normal breath sounds. No wheezing or rhonchi.  Skin:    General: Skin is warm and dry.  Neurological:     General: No focal deficit present.     Mental Status: She is alert and oriented to person, place, and time. Mental status is at baseline.  Psychiatric:        Mood and Affect: Mood normal.        Behavior: Behavior normal.        Thought Content: Thought content normal.        Judgment: Judgment normal.      Lab Results:  CBC    Component Value Date/Time   WBC 5.0 11/19/2023 0343   RBC 4.33 11/19/2023 0343   HGB 11.6 (L) 11/19/2023 0343   HGB 13.1 08/08/2023 1241   HCT 34.0 (L) 11/19/2023 0343   HCT 41.7 08/08/2023 1241   PLT 167 11/19/2023 0343   PLT 219 08/08/2023 1241   MCV 78.5 (L) 11/19/2023 0343   MCV 84 08/08/2023 1241   MCH 26.8 11/19/2023 0343   MCHC 34.1 11/19/2023 0343   RDW 14.7 11/19/2023 0343   RDW 15.1 08/08/2023 1241   LYMPHSABS 1.7 11/19/2023 0343   LYMPHSABS 1.7 05/08/2019 0000   MONOABS 0.5  11/19/2023 0343   EOSABS 0.1 11/19/2023 0343   EOSABS 0.0 05/08/2019 0000   BASOSABS 0.0 11/19/2023 0343   BASOSABS 0.0 05/08/2019 0000    BMET    Component Value Date/Time   NA 136 12/04/2023 1949   NA 139 08/08/2023 1241   K 3.9 12/04/2023 1949   CL 107 12/04/2023 1949   CO2 22 12/04/2023 1949   GLUCOSE 122 (H) 12/04/2023 1949   BUN 13 12/04/2023 1949   BUN 8 08/08/2023 1241   CREATININE 1.14 (H) 12/04/2023 1949   CALCIUM  8.9 12/04/2023 1949   GFRNONAA >60 12/04/2023 1949   GFRAA 117 05/08/2019 0000    BNP    Component Value Date/Time   BNP 11.3 05/01/2021 1250    ProBNP No results found for: PROBNP  Imaging: CT Head Wo Contrast Result Date: 12/05/2023 CLINICAL DATA:  Headache, low blood sugar, lightheadedness EXAM: CT HEAD WITHOUT CONTRAST TECHNIQUE: Contiguous axial images were obtained from the base of the skull through the vertex without intravenous contrast. RADIATION DOSE REDUCTION: This exam was performed according to the departmental dose-optimization program which includes automated exposure control, adjustment of the mA and/or kV according to patient size and/or use of iterative reconstruction technique. COMPARISON:  CT head 05/16/2018 FINDINGS: Brain: No intracranial hemorrhage, mass effect, or evidence of acute infarct. No hydrocephalus. No extra-axial fluid collection. Vascular: No hyperdense vessel or unexpected calcification. Skull: No fracture or focal lesion. Sinuses/Orbits: No acute finding. Other: None. IMPRESSION: No acute intracranial abnormality. Electronically Signed   By: Norman Gatlin M.D.   On: 12/05/2023 01:19   CT Angio Chest PE W and/or Wo Contrast Result Date: 11/19/2023 CLINICAL DATA:  Rule out pulmonary embolism. Low to intermediate probability. Positive D-dimer. Sub sternal chest pain that radiates to back and right-side of neck which is exacerbated by taking and deep breath. Chest heaviness. EXAM: CT ANGIOGRAPHY CHEST WITH CONTRAST  TECHNIQUE: Multidetector  CT imaging of the chest was performed using the standard protocol during bolus administration of intravenous contrast. Multiplanar CT image reconstructions and MIPs were obtained to evaluate the vascular anatomy. RADIATION DOSE REDUCTION: This exam was performed according to the departmental dose-optimization program which includes automated exposure control, adjustment of the mA and/or kV according to patient size and/or use of iterative reconstruction technique. CONTRAST:  75mL OMNIPAQUE  IOHEXOL  350 MG/ML SOLN COMPARISON:  05/01/2021 FINDINGS: Cardiovascular: Satisfactory opacification of the pulmonary arteries to the segmental level. No evidence of pulmonary embolism. Normal heart size. No pericardial effusion. Mediastinum/Nodes: No enlarged mediastinal, hilar, or axillary lymph nodes. Thyroid  gland, trachea, and esophagus demonstrate no significant findings. Lungs/Pleura: Trace pleural effusions identified bilaterally. No interstitial edema, or airspace consolidation. No pneumothorax identified. No suspicious pulmonary nodule or mass. Upper Abdomen: No acute abnormality. Musculoskeletal: No chest wall abnormality. No acute or significant osseous findings. Review of the MIP images confirms the above findings. IMPRESSION: 1. No evidence for acute pulmonary embolus. 2. Trace pleural effusions bilaterally.  No interstitial edema. Electronically Signed   By: Waddell Calk M.D.   On: 11/19/2023 06:00   DG Chest Portable 1 View Result Date: 11/19/2023 CLINICAL DATA:  Substernal chest pain that radiates to the back. EXAM: PORTABLE CHEST 1 VIEW COMPARISON:  January 10, 2023 FINDINGS: The heart size and mediastinal contours are within normal limits. Both lungs are clear. The visualized skeletal structures are unremarkable. IMPRESSION: No active disease. Electronically Signed   By: Suzen Dials M.D.   On: 11/19/2023 03:03     Assessment & Plan:   1. Mild intermittent asthma without  complication Comments: Chronic, symptoms stable on Symbicort  80mcg 2 puffs twice daily. Pulmonary input appreciated.  - montelukast  (SINGULAIR ) 10 MG tablet; Take 1 tablet (10 mg total) by mouth daily.  Dispense: 90 tablet; Refill: 3  2. OSA (obstructive sleep apnea) (Primary) - Ambulatory Referral for DME     Costochondritis Improvement in chest pain with Naproxen  and Omeprazole . No current chest discomfort. -Continue Naproxen  and Omeprazole  as needed.  Asthma Reports inconsistent use of Symbicort  , using it mainly in the morning and using rescue inhaler at night. Advise consistent use of Symbicort  twice daily, in the morning and at night, to decrease inflammation and prevent nighttime chest discomfort. -Refill Montelukast  (Singulair ) to be taken at bedtime to help with inflammation and asthma symptoms.  Sleep Apnea Patient has severe sleep apnea. Reports consistent use of CPAP machine, but sometimes removes it early in the morning. Current pressure 5-20cm h20; Residual AHI 0.2/hour. High amount of airleaks. -Encourage consistent use of CPAP machine throughout the night to prevent apneic events and associated health risks. -Recommend lowering max pressure setting on CPAP machine for patient comfort.  Follow-up in 6 months or sooner if any issues arise with asthma symptoms, chest pain, or sleep apnea.      Almarie LELON Ferrari, NP 12/06/2023

## 2023-12-11 ENCOUNTER — Ambulatory Visit: Payer: Managed Care, Other (non HMO) | Attending: Internal Medicine

## 2023-12-11 ENCOUNTER — Encounter: Payer: Self-pay | Admitting: Internal Medicine

## 2023-12-11 ENCOUNTER — Ambulatory Visit (INDEPENDENT_AMBULATORY_CARE_PROVIDER_SITE_OTHER): Payer: Managed Care, Other (non HMO) | Admitting: Internal Medicine

## 2023-12-11 VITALS — BP 124/84 | HR 98 | Temp 98.3°F | Ht 63.0 in | Wt 263.0 lb

## 2023-12-11 DIAGNOSIS — R002 Palpitations: Secondary | ICD-10-CM | POA: Diagnosis not present

## 2023-12-11 DIAGNOSIS — E1122 Type 2 diabetes mellitus with diabetic chronic kidney disease: Secondary | ICD-10-CM

## 2023-12-11 DIAGNOSIS — I129 Hypertensive chronic kidney disease with stage 1 through stage 4 chronic kidney disease, or unspecified chronic kidney disease: Secondary | ICD-10-CM | POA: Diagnosis not present

## 2023-12-11 DIAGNOSIS — E66813 Obesity, class 3: Secondary | ICD-10-CM

## 2023-12-11 DIAGNOSIS — Z6841 Body Mass Index (BMI) 40.0 and over, adult: Secondary | ICD-10-CM

## 2023-12-11 DIAGNOSIS — N182 Chronic kidney disease, stage 2 (mild): Secondary | ICD-10-CM | POA: Diagnosis not present

## 2023-12-11 DIAGNOSIS — M542 Cervicalgia: Secondary | ICD-10-CM

## 2023-12-11 DIAGNOSIS — D573 Sickle-cell trait: Secondary | ICD-10-CM

## 2023-12-11 DIAGNOSIS — J4520 Mild intermittent asthma, uncomplicated: Secondary | ICD-10-CM

## 2023-12-11 DIAGNOSIS — D649 Anemia, unspecified: Secondary | ICD-10-CM

## 2023-12-11 NOTE — Patient Instructions (Addendum)
 Magnesium glycinate Pure Encapsulations  [facility]   Type 2 Diabetes Mellitus, Diagnosis, Adult Type 2 diabetes (type 2 diabetes mellitus) is a long-term (chronic) disease. It may happen when there is one or both of these problems: The pancreas does not make enough insulin . The body does not react in a normal way to insulin  that it makes. Insulin  lets sugars go into cells in your body. If you have type 2 diabetes, sugars cannot get into your cells. Sugars build up in the blood. This causes high blood sugar. What are the causes? The exact cause of this condition is not known. What increases the risk? Having type 2 diabetes in your family. Being overweight or very overweight. Not being active. Your body not reacting in a normal way to the insulin  it makes. Having higher than normal blood sugar over time. Having a type of diabetes when you were pregnant. Having a condition that causes small fluid-filled sacs on your ovaries. What are the signs or symptoms? At first, you may have no symptoms. You will get symptoms slowly. They may include: More thirst than normal. More hunger than normal. Needing to pee more than normal. Losing weight without trying. Feeling tired. Feeling weak. Seeing things blurry. Dark patches on your skin. How is this treated? This condition may be treated by a diabetes expert. You may need to: Follow an eating plan made by a food expert (dietitian). Get regular exercise. Find ways to deal with stress. Check blood sugar as often as told. Take medicines. Your doctor will set treatment goals for you. Your blood sugar should be at these levels: Before meals: 80-130 mg/dL (4.4-7.2 mmol/L). After meals: below 180 mg/dL (10 mmol/L). Over the last 2-3 months: less than 7%. Follow these instructions at home: Medicines Take your diabetes medicines or insulin  every day. Take medicines as told to help you prevent other problems  caused by this condition. You may need: Aspirin. Medicine to lower cholesterol. Medicine to control blood pressure. Questions to ask your doctor Should I meet with a diabetes educator? What medicines do I need, and when should I take them? What will I need to treat my condition at home? When should I check my blood sugar? Where can I find a support group? Who can I call if I have questions? When is my next doctor visit? General instructions Take over-the-counter and prescription medicines only as told by your doctor. Keep all follow-up visits. Where to find more information For help and guidance and more information about diabetes, please go to: American Diabetes Association (ADA): www.diabetes.org American Association of Diabetes Care and Education Specialists (ADCES): www.diabeteseducator.org International Diabetes Federation (IDF): dconly.dk Contact a doctor if: Your blood sugar is at or above 240 mg/dL (86.6 mmol/L) for 2 days in a row. You have been sick for 2 days or more, and you are not getting better. You have had a fever for 2 days or more, and you are not getting better. You have any of these problems for more than 6 hours: You cannot eat or drink. You feel like you may vomit. You vomit. You have watery poop (diarrhea). Get help right away if: Your blood sugar is lower than 54 mg/dL (3 mmol/L). You feel mixed up (confused). You have trouble thinking clearly. You have trouble breathing. You have medium or large ketone levels in your pee. These symptoms may be an emergency. Get help right away. Call your local emergency services (911 in the [place] Do not  wait to see if the symptoms will go away. Do not drive yourself to the hospital. Summary Type 2 diabetes is a long-term disease. Your pancreas may not make enough insulin , or your body may not react in a normal way to insulin  that it makes. This condition is treated with an eating plan, lifestyle changes, and  medicines. Your doctor will set treatment goals for you. These will help you keep your blood sugar in a healthy range. Keep all follow-up visits. This information is not intended to replace advice given to you by your health care provider. Make sure you discuss any questions you have with your health care provider. Document Revised: 02/08/2021 Document Reviewed: 02/08/2021 Elsevier Patient Education  2024 [employer].

## 2023-12-11 NOTE — Progress Notes (Unsigned)
 EP to read.

## 2023-12-11 NOTE — Progress Notes (Signed)
 I,Victoria T Emmitt, CMA,acting as a neurosurgeon for Kimberly LOISE Slocumb, MD.,have documented all relevant documentation on the behalf of Kimberly LOISE Slocumb, MD,as directed by  Kimberly LOISE Slocumb, MD while in the presence of Kimberly LOISE Slocumb, MD.  Subjective:  [name] , female    DOB: November 17, 1991 , 33 y.o.   MRN: [id number]  Chief Complaint  Patient presents with   Diabetes   Hypertension    HPI  Patient presents today for diabetes & blood pressure check. Patient reports compliance with medications. Denies headache, chest pain, and SOB.   She reports noticing her heart feeling it is beating faster. She is not sure what is triggering her sx. She describes it as  beating out of my chest. There is no associated chest pain or shortness of breath.   Diabetes She presents for her follow-up diabetic visit. She has type 2 diabetes mellitus. Pertinent negatives for hypoglycemia include no headaches. Pertinent negatives for diabetes include no blurred vision and no chest pain. Risk factors for coronary artery disease include diabetes mellitus, obesity and sedentary lifestyle.  Hypertension This is a chronic problem. The current episode started more than 1 year ago. The problem has been gradually improving since onset. The problem is controlled. Associated symptoms include neck pain. Pertinent negatives include no blurred vision, chest pain or headaches. Risk factors for coronary artery disease include obesity. The current treatment provides moderate improvement.     Past Medical History:  Diagnosis Date   Anxiety    Asthma    Back pain    Chest pain    Depression    Diabetes mellitus without complication (HCC)    Edema, lower extremity    Fluid collection (edema) in the arms, legs, hands and feet    Hypertension    Pericardial effusion    Sleep apnea    SOB (shortness of breath)      Family History  Problem Relation Age of Onset   Healthy Mother    High blood pressure Father     Sickle cell trait Father      Current Outpatient Medications:    Accu-Chek Softclix Lancets lancets, Use as instructed, Disp: 100 each, Rfl: 12   albuterol  (VENTOLIN  HFA) 108 (90 Base) MCG/ACT inhaler, Inhale 2 puffs into the lungs every 6 (six) hours as needed for wheezing or shortness of breath., Disp: 54 g, Rfl: 2   benzonatate  (TESSALON ) 100 MG capsule, Take 1 capsule (100 mg total) by mouth every 8 (eight) hours., Disp: 21 capsule, Rfl: 0   budesonide -formoterol  (SYMBICORT ) 80-4.5 MCG/ACT inhaler, Inhale 2 puffs into the lungs in the morning and at bedtime., Disp: 10.2 g, Rfl: 3   carvedilol  (COREG ) 25 MG tablet, TAKE 1 TABLET(25 MG) BY MOUTH TWICE DAILY WITH A MEAL, Disp: 180 tablet, Rfl: 1   Continuous Blood Gluc Receiver (FREESTYLE LIBRE 3 READER) DEVI, 1 applicator by Does not apply route as directed., Disp: 1 each, Rfl: 1   Continuous Glucose Sensor (FREESTYLE LIBRE 3 SENSOR) MISC, Use as directed to check blood sugars, Disp: 2 each, Rfl: 3   cyclobenzaprine  (FLEXERIL ) 10 MG tablet, Take 1 tablet (10 mg total) by mouth 3 (three) times daily as needed for muscle spasms., Disp: 20 tablet, Rfl: 0   montelukast  (SINGULAIR ) 10 MG tablet, Take 1 tablet (10 mg total) by mouth daily., Disp: 90 tablet, Rfl: 3   naproxen  sodium (ANAPROX  DS) 550 MG tablet, Take 1 tablet (550 mg total) by mouth 2 (two)  times daily with a meal., Disp: 30 tablet, Rfl: 1   norethindrone  (AYGESTIN ) 5 MG tablet, TAKE 2 TABLETS(10 MG) BY MOUTH EVERY DAY, Disp: 180 tablet, Rfl: 0   omeprazole  (PRILOSEC) 40 MG capsule, Take 1 capsule (40 mg total) by mouth daily., Disp: 30 capsule, Rfl: 5   Semaglutide , 2 MG/DOSE, (OZEMPIC , 2 MG/DOSE,) 8 MG/3ML SOPN, INJECT 2MG  SUBCUTANEOUS ONCE A WEEK, Disp: 3 mL, Rfl: 1   spironolactone  (ALDACTONE ) 25 MG tablet, Take 50 mg by mouth daily., Disp: , Rfl:    Allergies  Allergen Reactions   Mushroom Extract Complex (Do Not Select) Other (See Comments)    Flu like symptoms       Review of Systems  Constitutional: Negative.   Eyes:  Negative for blurred vision.  Respiratory: Negative.    Cardiovascular: Negative.  Negative for chest pain.  Gastrointestinal: Negative.   Musculoskeletal:  Positive for neck pain.  Neurological: Negative.  Negative for headaches.  Psychiatric/Behavioral: Negative.       Today's Vitals   12/11/23 1618  BP: 124/84  Pulse: 98  Temp: 98.3 F (36.8 C)  SpO2: 98%  Weight: 263 lb (119.3 kg)  Height: 5' 3 (1.6 m)   Body mass index is 46.59 kg/m.  Wt Readings from Last 3 Encounters:  12/11/23 263 lb (119.3 kg)  12/06/23 263 lb 9.6 oz (119.6 kg)  12/04/23 259 lb 0.7 oz (117.5 kg)     Objective:  Physical Exam Vitals and nursing note reviewed.  Constitutional:      Appearance: Normal appearance. She is obese.  HENT:     Head: Normocephalic and atraumatic.  Eyes:     Extraocular Movements: Extraocular movements intact.  Cardiovascular:     Rate and Rhythm: Regular rhythm. Tachycardia present.     Heart sounds: Normal heart sounds.  Pulmonary:     Effort: Pulmonary effort is normal.     Breath sounds: Normal breath sounds.  Musculoskeletal:     Cervical back: Normal range of motion. Tenderness present.  Skin:    General: Skin is warm.  Neurological:     General: No focal deficit present.     Mental Status: She is alert.  Psychiatric:        Mood and Affect: Mood normal.        Behavior: Behavior normal.         Assessment And Plan:  Type 2 diabetes mellitus with stage 2 chronic kidney disease, without long-term current use of insulin  (HCC) Assessment & Plan: Chronic, she will continue with Ozempic  2mg  weekly. Importance of adequate nutrition and protein intake was stressed to the patient. Importance of dietary/exercise/medication compliance was discussed with the patient.    Orders: -     Hemoglobin A1c  Hypertensive nephropathy Assessment & Plan: Chronic, well controlled.  She will continue with  carvedilol  25mg  twice daily and spironolactone  daily. Encouraged to follow low sodium diet.    Palpitations Assessment & Plan: I will check labs as below. She agrees to heart monitor. Thyroid  function was normal in October 2024. Advised to avoid caffeinated beverages.   Orders: -     Magnesium -     LONG TERM MONITOR (3-14 DAYS); Future  Cervicalgia -     Ambulatory referral to Chiropractic  Sickle cell trait [facility]) Assessment & Plan: Chronic, she is stable.  She is encouraged to stay well hydrated.    Anemia, unspecified type -     Iron, TIBC and Ferritin Panel  Class 3 severe  obesity due to excess calories with serious comorbidity and body mass index (BMI) of 45.0 to 49.9 in adult [facility]) Assessment & Plan: BMI 46.  She is encouraged to strive to lose ten percent of her body weight to decrease cardiac risk. She is advised to aim for at least 150 minutes of exercise per week.   She is encouraged to strive for BMI less than 30 to decrease cardiac risk. Advised to aim for at least 150 minutes of exercise per week.    Return for 4 month dm .  Patient was given opportunity to ask questions. Patient verbalized understanding of the plan and was able to repeat key elements of the plan. All questions were answered to their satisfaction.    I, Kimberly LOISE Slocumb, MD, have reviewed all documentation for this visit. The documentation on 12/11/23 for the exam, diagnosis, procedures, and orders are all accurate and complete.   IF YOU HAVE BEEN REFERRED TO A SPECIALIST, IT MAY TAKE 1-2 WEEKS TO SCHEDULE/PROCESS THE REFERRAL. IF YOU HAVE NOT HEARD FROM US /SPECIALIST IN TWO WEEKS, PLEASE GIVE US  A CALL AT [phone] X [phone].   THE PATIENT IS ENCOURAGED TO PRACTICE SOCIAL DISTANCING DUE TO THE COVID-19 PANDEMIC.

## 2023-12-12 LAB — IRON,TIBC AND FERRITIN PANEL
Ferritin: 204 ng/mL — ABNORMAL HIGH (ref 15–150)
Iron Saturation: 23 % (ref 15–55)
Iron: 86 ug/dL (ref 27–159)
Total Iron Binding Capacity: 367 ug/dL (ref 250–450)
UIBC: 281 ug/dL (ref 131–425)

## 2023-12-12 LAB — HEMOGLOBIN A1C
Est. average glucose Bld gHb Est-mCnc: 120 mg/dL
Hgb A1c MFr Bld: 5.8 % — ABNORMAL HIGH (ref 4.8–5.6)

## 2023-12-12 LAB — MAGNESIUM: Magnesium: 2 mg/dL (ref 1.6–2.3)

## 2023-12-13 DIAGNOSIS — R002 Palpitations: Secondary | ICD-10-CM | POA: Insufficient documentation

## 2023-12-13 NOTE — Assessment & Plan Note (Signed)
 Chronic, she is stable.  She is encouraged to stay well hydrated.

## 2023-12-13 NOTE — Assessment & Plan Note (Addendum)
 I will check labs as below. She agrees to heart monitor. Thyroid  function was normal in October 2024. Advised to avoid caffeinated beverages.

## 2023-12-13 NOTE — Assessment & Plan Note (Signed)
 Chronic, well controlled.  She will continue with carvedilol  25mg  twice daily and spironolactone daily. Encouraged to follow low sodium diet.

## 2023-12-13 NOTE — Assessment & Plan Note (Signed)
 BMI 46.  She is encouraged to strive to lose ten percent of her body weight to decrease cardiac risk. She is advised to aim for at least 150 minutes of exercise per week.

## 2023-12-13 NOTE — Assessment & Plan Note (Signed)
 Chronic, she will continue with Ozempic 2mg  weekly. Importance of adequate nutrition and protein intake was stressed to the patient. Importance of dietary/exercise/medication compliance was discussed with the patient.

## 2023-12-14 ENCOUNTER — Encounter: Payer: Self-pay | Admitting: Internal Medicine

## 2024-01-11 DIAGNOSIS — R002 Palpitations: Secondary | ICD-10-CM | POA: Diagnosis not present

## 2024-01-16 ENCOUNTER — Encounter: Payer: Self-pay | Admitting: Internal Medicine

## 2024-02-02 ENCOUNTER — Encounter: Payer: Self-pay | Admitting: Internal Medicine

## 2024-03-05 ENCOUNTER — Encounter: Payer: Self-pay | Admitting: Internal Medicine

## 2024-03-05 ENCOUNTER — Ambulatory Visit (INDEPENDENT_AMBULATORY_CARE_PROVIDER_SITE_OTHER): Admitting: Internal Medicine

## 2024-03-05 VITALS — BP 138/90 | HR 86 | Temp 98.3°F | Ht 63.0 in | Wt 264.6 lb

## 2024-03-05 DIAGNOSIS — E1169 Type 2 diabetes mellitus with other specified complication: Secondary | ICD-10-CM

## 2024-03-05 DIAGNOSIS — I129 Hypertensive chronic kidney disease with stage 1 through stage 4 chronic kidney disease, or unspecified chronic kidney disease: Secondary | ICD-10-CM

## 2024-03-05 DIAGNOSIS — N182 Chronic kidney disease, stage 2 (mild): Secondary | ICD-10-CM | POA: Diagnosis not present

## 2024-03-05 DIAGNOSIS — R002 Palpitations: Secondary | ICD-10-CM | POA: Diagnosis not present

## 2024-03-05 DIAGNOSIS — D649 Anemia, unspecified: Secondary | ICD-10-CM

## 2024-03-05 DIAGNOSIS — Z6841 Body Mass Index (BMI) 40.0 and over, adult: Secondary | ICD-10-CM

## 2024-03-05 NOTE — Assessment & Plan Note (Signed)
 Chronic, she is encouraged to keep BP well controlled and to stay well hydrated to decrease risk of CKD progression.

## 2024-03-05 NOTE — Assessment & Plan Note (Signed)
 Chronic, she will continue with Ozempic 2mg  weekly. Importance of adequate nutrition and protein intake was stressed to the patient. Importance of dietary/exercise/medication compliance was discussed with the patient.

## 2024-03-05 NOTE — Assessment & Plan Note (Signed)
 Persistent. She did wear Zio monitor and results are as below: Min HR of 67 bpm, max HR of 245 bpm, and avg HR of 96 bpm. Predominant underlying rhythm was Sinus Rhythm. 2 Ventricular Tachycardia runs occurred. The run with the fastest interval lasting 5 beats with a max rate of 245 bpm, the longest lasting 5 beats with an avg rate of 157 bpm. 1 run of Supraventricular Tachycardia occurred lasting 4 beats with a max rate of 190 bpm ( avg 177 bpm) . Ventricular Bigeminy and Trigeminy were present.  -I will refer her to Cardiology, last seen in 2023.  -She admits to drinking regular coffee, advised to AVOID all caffeinated drinks. May drink decaf.  -Stay well hydrated -Take Magnesium nightly -Check labs as below.

## 2024-03-05 NOTE — Assessment & Plan Note (Signed)
 BMI 46 today. She is encouraged to strive to lose ten percent of her body weight to decrease cardiac risk. Advised to aim for at least 150 minutes of exercise per week.

## 2024-03-05 NOTE — Progress Notes (Signed)
 I,Kimberly Heath, CMA,acting as a Neurosurgeon for Kimberly Aliment, MD.,have documented all relevant documentation on the behalf of Kimberly Aliment, MD,as directed by  Kimberly Aliment, MD while in the presence of Kimberly Aliment, MD.  Subjective:  [name] , female    DOB: 12-31-1990 , 33 y.o.   MRN: [id number]  Chief Complaint  Patient presents with   Palpitations    Patient presents today for palpitations. She states this has been an ongoing issue. It is happening again more frequently during the night. She is established with cardiologist, she did not think to schedule with them initially. She denies headache, chest pain, sob & blurred vision.  She also is due to schedule PAP.      HPI  Patient presents today for further evaluation of palpitations. She states her sx have persisted.  She is unable to state what triggers her sx. She does not feel her sx are due to anxiety.  Sx can happen at rest. She was previously scheduled for diabetes & blood pressure check next month. She agrees to do this today. Patient reports compliance with medications. Denies headache, chest pain, and SOB.     Diabetes She presents for her follow-up diabetic visit. She has type 2 diabetes mellitus. Pertinent negatives for hypoglycemia include no headaches. Pertinent negatives for diabetes include no blurred vision and no chest pain. Risk factors for coronary artery disease include diabetes mellitus, obesity and sedentary lifestyle.  Hypertension This is a chronic problem. The current episode started more than 1 year ago. The problem has been gradually improving since onset. The problem is controlled. Associated symptoms include neck pain. Pertinent negatives include no blurred vision, chest pain or headaches. Risk factors for coronary artery disease include obesity. The current treatment provides moderate improvement.     Past Medical History:  Diagnosis Date   Anxiety    Asthma    Back pain     Chest pain    Depression    Diabetes mellitus without complication (HCC)    Edema, lower extremity    Fluid collection (edema) in the arms, legs, hands and feet    Hypertension    Pericardial effusion    Sleep apnea    SOB (shortness of breath)      Family History  Problem Relation Age of Onset   Healthy Mother    High blood pressure Father    Sickle cell trait Father      Current Outpatient Medications:    Accu-Chek Softclix Lancets lancets, Use as instructed, Disp: 100 each, Rfl: 12   albuterol (VENTOLIN HFA) 108 (90 Base) MCG/ACT inhaler, Inhale 2 puffs into the lungs every 6 (six) hours as needed for wheezing or shortness of breath., Disp: 54 g, Rfl: 2   benzonatate (TESSALON) 100 MG capsule, Take 1 capsule (100 mg total) by mouth every 8 (eight) hours., Disp: 21 capsule, Rfl: 0   budesonide-formoterol (SYMBICORT) 80-4.5 MCG/ACT inhaler, Inhale 2 puffs into the lungs in the morning and at bedtime., Disp: 10.2 g, Rfl: 3   carvedilol (COREG) 25 MG tablet, TAKE 1 TABLET(25 MG) BY MOUTH TWICE DAILY WITH A MEAL, Disp: 180 tablet, Rfl: 1   Continuous Blood Gluc Receiver (FREESTYLE LIBRE 3 READER) DEVI, 1 applicator by Does not apply route as directed., Disp: 1 each, Rfl: 1   Continuous Glucose Sensor (FREESTYLE LIBRE 3 SENSOR) MISC, Use as directed to check blood sugars, Disp: 2 each, Rfl: 3   cyclobenzaprine (FLEXERIL)  10 MG tablet, Take 1 tablet (10 mg total) by mouth 3 (three) times daily as needed for muscle spasms., Disp: 20 tablet, Rfl: 0   montelukast (SINGULAIR) 10 MG tablet, Take 1 tablet (10 mg total) by mouth daily., Disp: 90 tablet, Rfl: 3   norethindrone (AYGESTIN) 5 MG tablet, TAKE 2 TABLETS(10 MG) BY MOUTH EVERY DAY, Disp: 180 tablet, Rfl: 0   omeprazole (PRILOSEC) 40 MG capsule, Take 1 capsule (40 mg total) by mouth daily., Disp: 30 capsule, Rfl: 5   Semaglutide, 2 MG/DOSE, (OZEMPIC, 2 MG/DOSE,) 8 MG/3ML SOPN, INJECT 2MG  SUBCUTANEOUS ONCE A WEEK, Disp: 3 mL, Rfl: 1    spironolactone (ALDACTONE) 25 MG tablet, Take 50 mg by mouth daily., Disp: , Rfl:    Allergies  Allergen Reactions   Mushroom Extract Complex (Obsolete) Other (See Comments)    Flu like symptoms      Review of Systems  Constitutional: Negative.   Eyes:  Negative for blurred vision.  Respiratory: Negative.    Cardiovascular: Negative.  Negative for chest pain.  Gastrointestinal: Negative.   Musculoskeletal:  Positive for neck pain.  Neurological: Negative.  Negative for headaches.  Psychiatric/Behavioral: Negative.       Today's Vitals   03/05/24 1204  BP: (!) 138/90  Pulse: 86  Temp: 98.3 F (36.8 C)  SpO2: 98%  Weight: 264 lb 9.6 oz (120 kg)  Height: 5\' 3"  (1.6 m)   Body mass index is 46.87 kg/m.  Wt Readings from Last 3 Encounters:  03/05/24 264 lb 9.6 oz (120 kg)  12/11/23 263 lb (119.3 kg)  12/06/23 263 lb 9.6 oz (119.6 kg)     Objective:  Physical Exam Vitals and nursing note reviewed.  Constitutional:      Appearance: Normal appearance. She is obese.  HENT:     Head: Normocephalic and atraumatic.  Eyes:     Extraocular Movements: Extraocular movements intact.  Cardiovascular:     Rate and Rhythm: Normal rate and regular rhythm.     Heart sounds: Normal heart sounds.  Pulmonary:     Effort: Pulmonary effort is normal.     Breath sounds: Normal breath sounds.  Musculoskeletal:     Cervical back: Normal range of motion.  Skin:    General: Skin is warm.  Neurological:     General: No focal deficit present.     Mental Status: She is alert.  Psychiatric:        Mood and Affect: Mood normal.        Behavior: Behavior normal.         Assessment And Plan:  Palpitations Assessment & Plan: Persistent. She did wear Zio monitor and results are as below: Min HR of 67 bpm, max HR of 245 bpm, and avg HR of 96 bpm. Predominant underlying rhythm was Sinus Rhythm. 2 Ventricular Tachycardia runs occurred. The run with the fastest interval lasting 5 beats with a  max rate of 245 bpm, the longest lasting 5 beats with an avg rate of 157 bpm. 1 run of Supraventricular Tachycardia occurred lasting 4 beats with a max rate of 190 bpm ( avg 177 bpm) . Ventricular Bigeminy and Trigeminy were present.  -I will refer her to Cardiology, last seen in 2023.  -She admits to drinking regular coffee, advised to AVOID all caffeinated drinks. May drink decaf.  -Stay well hydrated -Take Magnesium nightly -Check labs as below.  Orders: -     Ambulatory referral to Cardiology -     CMP14+EGFR -  TSH -     Magnesium  Hypertensive nephropathy Assessment & Plan: Chronic, uncontrolled. She reports compliance with both spironolactone and carvedilol. She is reminded to follow low sodium diet - big culprits include breads, cheeses, pizza and processed meats including bacon, sausages and deli meats. No med change today.    Chronic kidney disease, stage II (mild) Assessment & Plan: Chronic, she is encouraged to keep BP well controlled and to stay well hydrated to decrease risk of CKD progression.     Type 2 diabetes mellitus with morbid obesity (HCC) Assessment & Plan: Chronic, she will continue with Ozempic 2mg  weekly. Importance of adequate nutrition and protein intake was stressed to the patient. Importance of dietary/exercise/medication compliance was discussed with the patient.    Orders: -     CMP14+EGFR -     Hemoglobin A1c  Anemia, unspecified type -     CBC -     Vitamin B12 -     Iron, TIBC and Ferritin Panel  BMI 45.0-49.9, adult (HCC) Assessment & Plan: BMI 46 today. She is encouraged to strive to lose ten percent of her body weight to decrease cardiac risk. Advised to aim for at least 150 minutes of exercise per week.       Return cancel May appt, for 3 month dm check.  Patient was given opportunity to ask questions. Patient verbalized understanding of the plan and was able to repeat key elements of the plan. All questions were answered to  their satisfaction.    I, Kimberly Aliment, MD, have reviewed all documentation for this visit. The documentation on 03/05/24 for the exam, diagnosis, procedures, and orders are all accurate and complete.   IF YOU HAVE BEEN REFERRED TO A SPECIALIST, IT MAY TAKE 1-2 WEEKS TO SCHEDULE/PROCESS THE REFERRAL. IF YOU HAVE NOT HEARD FROM US/SPECIALIST IN TWO WEEKS, PLEASE GIVE [place] A CALL AT [phone].   THE PATIENT IS ENCOURAGED TO PRACTICE SOCIAL DISTANCING DUE TO THE COVID-19 PANDEMIC.

## 2024-03-05 NOTE — Assessment & Plan Note (Signed)
 Chronic, uncontrolled. She reports compliance with both spironolactone and carvedilol. She is reminded to follow low sodium diet - big culprits include breads, cheeses, pizza and processed meats including bacon, sausages and deli meats. No med change today.

## 2024-03-05 NOTE — Patient Instructions (Addendum)
 Magnesium glycinate - one nightly Palpitations Palpitations are feelings that your heartbeat is irregular or is faster than normal. It may feel like your heart is fluttering or skipping a beat. Palpitations may be caused by many things, including smoking, caffeine, alcohol, stress, and certain medicines or drugs. Most causes of palpitations are not serious.  However, some palpitations can be a sign of a serious problem. Further tests and a thorough medical history will be done to find the cause of your palpitations. Your provider may order tests such as an ECG, labs, an echocardiogram, or an ambulatory continuous ECG monitor. Follow these instructions at home: Pay attention to any changes in your symptoms. Let your health care provider know about them. Take these actions to help manage your symptoms: Eating and drinking Follow instructions from your health care provider about eating or drinking restrictions. You may need to avoid foods and drinks that may cause palpitations. These may include: Caffeinated coffee, tea, soft drinks, and energy drinks. Chocolate. Alcohol. Diet pills. Lifestyle     Take steps to reduce your stress and anxiety. Things that can help you relax include: Yoga. Mind-body activities, such as deep breathing, meditation, or using words and images to create positive thoughts (guided imagery). Physical activity, such as swimming, jogging, or walking. Tell your health care provider if your palpitations increase with activity. If you have chest pain or shortness of breath with activity, do not continue the activity until you are seen by your health care provider. Biofeedback. This is a method that helps you learn to use your mind to control things in your body, such as your heartbeat. Get plenty of rest and sleep. Keep a regular bed time. Do not use drugs, including cocaine or ecstasy. Do not use marijuana. Do not use any products that contain nicotine or tobacco. These  products include cigarettes, chewing tobacco, and vaping devices, such as e-cigarettes. If you need help quitting, ask your health care provider. General instructions Take over-the-counter and prescription medicines only as told by your health care provider. Keep all follow-up visits. This is important. These may include visits for further testing if palpitations do not go away or get worse. Contact a health care provider if: You continue to have a fast or irregular heartbeat for a long period of time. You notice that your palpitations occur more often. Get help right away if: You have chest pain or shortness of breath. You have a severe headache. You feel dizzy or you faint. These symptoms may represent a serious problem that is an emergency. Do not wait to see if the symptoms will go away. Get medical help right away. Call your local emergency services (911 in the [place] Do not drive yourself to the hospital. Summary Palpitations are feelings that your heartbeat is irregular or is faster than normal. It may feel like your heart is fluttering or skipping a beat. Palpitations may be caused by many things, including smoking, caffeine, alcohol, stress, certain medicines, and drugs. Further tests and a thorough medical history may be done to find the cause of your palpitations. Get help right away if you faint or have chest pain, shortness of breath, severe headache, or dizziness. This information is not intended to replace advice given to you by your health care provider. Make sure you discuss any questions you have with your health care provider. Document Revised: 04/07/2021 Document Reviewed: 04/07/2021 Elsevier Patient Education  2024 [employer].

## 2024-03-06 LAB — CMP14+EGFR
ALT: 31 IU/L (ref 0–32)
AST: 14 IU/L (ref 0–40)
Albumin: 4.3 g/dL (ref 3.9–4.9)
Alkaline Phosphatase: 58 IU/L (ref 44–121)
BUN/Creatinine Ratio: 10 (ref 9–23)
BUN: 10 mg/dL (ref 6–20)
Bilirubin Total: 0.3 mg/dL (ref 0.0–1.2)
CO2: 21 mmol/L (ref 20–29)
Calcium: 9.4 mg/dL (ref 8.7–10.2)
Chloride: 104 mmol/L (ref 96–106)
Creatinine, Ser: 1.03 mg/dL — ABNORMAL HIGH (ref 0.57–1.00)
Globulin, Total: 3.5 g/dL (ref 1.5–4.5)
Glucose: 71 mg/dL (ref 70–99)
Potassium: 4.8 mmol/L (ref 3.5–5.2)
Sodium: 140 mmol/L (ref 134–144)
Total Protein: 7.8 g/dL (ref 6.0–8.5)
eGFR: 74 mL/min/{1.73_m2} (ref >59)

## 2024-03-06 LAB — IRON,TIBC AND FERRITIN PANEL
Ferritin: 201 ng/mL — ABNORMAL HIGH (ref 15–150)
Iron Saturation: 34 % (ref 15–55)
Iron: 127 ug/dL (ref 27–159)
Total Iron Binding Capacity: 369 ug/dL (ref 250–450)
UIBC: 242 ug/dL (ref 131–425)

## 2024-03-06 LAB — CBC
Hematocrit: 38.7 % (ref 34.0–46.6)
Hemoglobin: 12.5 g/dL (ref 11.1–15.9)
MCH: 26.8 pg (ref 26.6–33.0)
MCHC: 32.3 g/dL (ref 31.5–35.7)
MCV: 83 fL (ref 79–97)
Platelets: 219 10*3/uL (ref 150–450)
RBC: 4.67 x10E6/uL (ref 3.77–5.28)
RDW: 14.4 % (ref 11.7–15.4)
WBC: 4 10*3/uL (ref 3.4–10.8)

## 2024-03-06 LAB — HEMOGLOBIN A1C
Est. average glucose Bld gHb Est-mCnc: 128 mg/dL
Hgb A1c MFr Bld: 6.1 % — ABNORMAL HIGH (ref 4.8–5.6)

## 2024-03-06 LAB — MAGNESIUM: Magnesium: 2.2 mg/dL (ref 1.6–2.3)

## 2024-03-06 LAB — TSH: TSH: 1.68 u[IU]/mL (ref 0.450–4.500)

## 2024-03-06 LAB — VITAMIN B12: Vitamin B-12: 402 pg/mL (ref 232–1245)

## 2024-03-07 ENCOUNTER — Encounter: Payer: Self-pay | Admitting: Internal Medicine

## 2024-03-26 ENCOUNTER — Encounter: Payer: Self-pay | Admitting: Internal Medicine

## 2024-04-02 ENCOUNTER — Other Ambulatory Visit: Payer: Self-pay | Admitting: Internal Medicine

## 2024-04-10 ENCOUNTER — Other Ambulatory Visit: Payer: Self-pay | Admitting: Primary Care

## 2024-04-10 DIAGNOSIS — J984 Other disorders of lung: Secondary | ICD-10-CM

## 2024-04-10 DIAGNOSIS — R0602 Shortness of breath: Secondary | ICD-10-CM

## 2024-04-10 NOTE — Telephone Encounter (Signed)
 Copied from CRM [id number]. Topic: Clinical - Medication Refill >> Apr 10, 2024  9:54 AM Hilton Lucky wrote: Medication: budesonide -formoterol  (SYMBICORT ) 80-4.5 MCG/ACT inhaler  Has the patient contacted their pharmacy? Yes - states need to speak to provider for it  This is the patient's preferred pharmacy:  [id number] - [place], [place] - [address] AT [address] [facility] OF [address] & [address] [place] [address] Phone: [phone] Fax: [phone] Is this the correct pharmacy for this prescription? Yes If no, delete pharmacy and type the correct one.   Has the prescription been filled recently? No  Is the patient out of the medication? Yes  Has the patient been seen for an appointment in the last year OR does the patient have an upcoming appointment? Yes  Can we respond through MyChart? Yes  Agent: Please be advised that Rx refills may take up to 3 business days. We ask that you follow-up with your pharmacy.

## 2024-04-11 ENCOUNTER — Ambulatory Visit: Payer: Managed Care, Other (non HMO) | Admitting: Internal Medicine

## 2024-04-11 MED ORDER — BUDESONIDE-FORMOTEROL FUMARATE 80-4.5 MCG/ACT IN AERO: 2.0000 | INHALATION_SPRAY | Freq: Two times a day (BID) | RESPIRATORY_TRACT | 3 refills | Status: DC

## 2024-05-03 ENCOUNTER — Other Ambulatory Visit: Payer: Self-pay | Admitting: Internal Medicine

## 2024-05-03 DIAGNOSIS — I10 Essential (primary) hypertension: Secondary | ICD-10-CM

## 2024-05-03 LAB — LAB REPORT - SCANNED
Albumin, Urine POC: 29.3
EGFR: 68
Microalb Creat Ratio: 19

## 2024-05-09 ENCOUNTER — Other Ambulatory Visit: Payer: Self-pay | Admitting: Internal Medicine

## 2024-05-09 DIAGNOSIS — I10 Essential (primary) hypertension: Secondary | ICD-10-CM

## 2024-05-09 MED ORDER — CARVEDILOL 25 MG PO TABS: 25.0000 mg | ORAL_TABLET | Freq: Two times a day (BID) | ORAL | 1 refills | Status: DC

## 2024-05-09 NOTE — Telephone Encounter (Signed)
 Copied from CRM [id number]. Topic: Clinical - Medication Refill >> May 09, 2024 12:33 PM Oddis Bench wrote: Medication: carvedilol  (COREG ) 25 MG tablet  Has the patient contacted their pharmacy? Yes Was denied by provider  This is the patient's preferred pharmacy:  [id number] - [place], [place] - [address] AT [facility] [address] [place] [address] Phone: [phone] Fax: [phone]    Is this the correct pharmacy for this prescription? Yes If no, delete pharmacy and type the correct one.   Has the prescription been filled recently? Yes  Is the patient out of the medication? Yes  Has the patient been seen for an appointment in the last year OR does the patient have an upcoming appointment? Yes  Can we respond through MyChart? Yes  Agent: Please be advised that Rx refills may take up to 3 business days. We ask that you follow-up with your pharmacy.

## 2024-06-02 ENCOUNTER — Other Ambulatory Visit: Payer: Self-pay | Admitting: Internal Medicine

## 2024-06-04 ENCOUNTER — Ambulatory Visit: Payer: Managed Care, Other (non HMO) | Admitting: Primary Care

## 2024-06-06 ENCOUNTER — Encounter: Payer: Self-pay | Admitting: Internal Medicine

## 2024-06-07 ENCOUNTER — Ambulatory Visit: Admitting: Primary Care

## 2024-06-11 ENCOUNTER — Ambulatory Visit: Admitting: Internal Medicine

## 2024-06-27 ENCOUNTER — Encounter: Payer: Self-pay | Admitting: Cardiology

## 2024-06-27 ENCOUNTER — Other Ambulatory Visit (HOSPITAL_COMMUNITY): Payer: Self-pay

## 2024-06-27 ENCOUNTER — Ambulatory Visit: Attending: Cardiology | Admitting: Cardiology

## 2024-06-27 VITALS — BP 148/89 | HR 93 | Resp 16 | Ht 63.0 in | Wt 263.2 lb

## 2024-06-27 DIAGNOSIS — E1169 Type 2 diabetes mellitus with other specified complication: Secondary | ICD-10-CM | POA: Diagnosis not present

## 2024-06-27 DIAGNOSIS — I4729 Other ventricular tachycardia: Secondary | ICD-10-CM

## 2024-06-27 DIAGNOSIS — R002 Palpitations: Secondary | ICD-10-CM | POA: Diagnosis not present

## 2024-06-27 DIAGNOSIS — G4733 Obstructive sleep apnea (adult) (pediatric): Secondary | ICD-10-CM | POA: Diagnosis not present

## 2024-06-27 DIAGNOSIS — E7800 Pure hypercholesterolemia, unspecified: Secondary | ICD-10-CM

## 2024-06-27 MED ORDER — OLMESARTAN MEDOXOMIL-HCTZ 20-12.5 MG PO TABS
1.0000 | ORAL_TABLET | ORAL | 2 refills | Status: DC
  Filled 2024-06-27: qty 30, 30d supply, fill #0

## 2024-06-27 MED ORDER — ATORVASTATIN CALCIUM 10 MG PO TABS
10.0000 mg | ORAL_TABLET | Freq: Every day | ORAL | 2 refills | Status: DC
  Filled 2024-06-27: qty 30, 30d supply, fill #0

## 2024-06-27 NOTE — Progress Notes (Signed)
 Cardiology Office Note:  .   Date:  06/27/2024  ID:  Kimberly Heath, DOB 16-Sep-1991, MRN [id number] PCP: Jarold Medici, MD  [facility] Providers Cardiologist:  None   History of Present Illness: .   Kimberly Heath is a 33 y.o. African-American female patient with morbid obesity, hypertension, chronic dyspnea on exertion, DM referred to me for evaluation of palpitations and abnormal Zio patch EKG monitoring.    Discussed the use of AI scribe software for clinical note transcription with the patient, who gave verbal consent to proceed.  History of Present Illness Kimberly Heath is a 33 year old female with diabetes and hypertension who presents with palpitations and abnormal heart rhythms. She was referred by Dr. Grayce Jarold for evaluation of abnormal heart rhythms detected on a monitor.  She experiences episodes of heart racing, now occurring two to three times a week, primarily at rest. A heart monitor was used to detect these abnormal rhythms. She has hypertension, with recent elevated blood pressure readings, and is on carvedilol  and spironolactone. Her diabetes is managed with Ozempic , though it has not been effective for weight loss. She experiences headaches and elevated blood pressure when meals are delayed.  She has sleep apnea and uses a CPAP machine. Her exercise routine includes walking, though not as frequently as recommended. She has stage 2A chronic kidney disease, managed by her kidney specialist.  Labs   Lab Results  Component Value Date   CHOL 161 08/08/2023   HDL 50 08/08/2023   LDLCALC 103 (H) 08/08/2023   TRIG 37 08/08/2023   CHOLHDL 3.2 08/08/2023   No results found for: LIPOA  Lab Results  Component Value Date   NA 140 03/05/2024   K 4.8 03/05/2024   CO2 21 03/05/2024   GLUCOSE 71 03/05/2024   BUN 10 03/05/2024   CREATININE 1.03 (H) 03/05/2024   CALCIUM  9.4 03/05/2024   EGFR 68.0 05/03/2024   GFRNONAA >60 12/04/2023      Latest Ref  Rng & Units 03/05/2024   12:33 PM 12/04/2023    7:49 PM 11/19/2023    3:43 AM  BMP  Glucose 70 - 99 mg/dL 71  877  877   BUN 6 - 20 mg/dL 10  13  12    Creatinine 0.57 - 1.00 mg/dL 8.96  8.85  8.92   BUN/Creat Ratio 9 - 23 10     Sodium 134 - 144 mmol/L 140  136  136   Potassium 3.5 - 5.2 mmol/L 4.8  3.9  3.8   Chloride 96 - 106 mmol/L 104  107  109   CO2 20 - 29 mmol/L 21  22  20    Calcium  8.7 - 10.2 mg/dL 9.4  8.9  8.4       Latest Ref Rng & Units 03/05/2024   12:33 PM 11/19/2023    3:43 AM 08/08/2023   12:41 PM  CBC  WBC 3.4 - 10.8 x10E3/uL 4.0  5.0  5.6   Hemoglobin 11.1 - 15.9 g/dL 87.4  88.3  86.8   Hematocrit 34.0 - 46.6 % 38.7  34.0  41.7   Platelets 150 - 450 x10E3/uL 219  167  219    Lab Results  Component Value Date   HGBA1C 6.1 (H) 03/05/2024    Lab Results  Component Value Date   TSH 1.680 03/05/2024     ROS  Review of Systems  Cardiovascular:  Positive for palpitations. Negative for chest pain, dyspnea on exertion, leg  swelling and syncope.   Physical Exam:   VS:  BP (!) 148/89 (BP Location: Left Arm, Patient Position: Sitting, Cuff Size: Large)   Pulse 93   Resp 16   Ht 5' 3 (1.6 m)   Wt 263 lb 3.2 oz (119.4 kg)   SpO2 100%   BMI 46.62 kg/m    Wt Readings from Last 3 Encounters:  06/27/24 263 lb 3.2 oz (119.4 kg)  03/05/24 264 lb 9.6 oz (120 kg)  12/11/23 263 lb (119.3 kg)    Physical Exam Constitutional:      Appearance: She is morbidly obese.  Neck:     Vascular: No carotid bruit or JVD.  Cardiovascular:     Rate and Rhythm: Normal rate and regular rhythm.     Pulses: Intact distal pulses.     Heart sounds: Normal heart sounds. No murmur heard.    No gallop.  Pulmonary:     Effort: Pulmonary effort is normal.     Breath sounds: Normal breath sounds.  Abdominal:     General: Bowel sounds are normal.     Palpations: Abdomen is soft.  Musculoskeletal:     Right lower leg: No edema.     Left lower leg: No edema.    Studies Reviewed: SABRA     Zio patch extended EKG monitoring for palpitations for 13 days starting 12/19/2023: 2 NSVT episodes occurred, longest 5 beats.  Both occurred at night while patient asleep and asymptomatic. There is 1 brief atrial tachycardia episode for 4 beats.  Rare PACs, atrial triplets and PVCs and ventricular couplets were present.  Bigeminy and trigeminy were present. Patient's symptoms correlated with PVCs.   Echocardiogram 04/22/2022: Left ventricle cavity is normal in size and wall thickness. Normal global wall motion. Normal LV systolic function with EF 56%. Normal diastolic filling pattern. No significant valvular abnormality. IVC not seen. Previously reported insignificant pericardial effusion and mild TR in 2019 not appreciated on this study. EKG:    EKG Interpretation Date/Time:  Thursday June 27 2024 09:15:34 EDT Ventricular Rate:  92 PR Interval:  126 QRS Duration:  80 QT Interval:  346 QTC Calculation: 427 R Axis:   52  Text Interpretation: EKG 06/17/2024: Normal sinus rhythm at rate of 92 bpm.  No significant change from 11/19/2023.  Normal EKG. Confirmed by Nadea Kirkland, Jagadeesh ([id number]) on 06/27/2024 9:42:48 AM    Medications ordered    Meds ordered this encounter  Medications   olmesartan -hydrochlorothiazide  (BENICAR  HCT) 20-12.5 MG tablet    Sig: Take 1 tablet by mouth every morning.    Dispense:  30 tablet    Refill:  2   atorvastatin  (LIPITOR) 10 MG tablet    Sig: Take 1 tablet (10 mg total) by mouth daily.    Dispense:  30 tablet    Refill:  2     ASSESSMENT AND PLAN: .      ICD-10-CM   1. Palpitations  R00.2 EKG 12-Lead    2. NSVT (nonsustained ventricular tachycardia) (HCC)  I47.29     3. OSA (obstructive sleep apnea)  G47.33     4. Type 2 diabetes mellitus with morbid obesity (HCC)  E11.69    E66.01     5. Hypercholesteremia  E78.00 atorvastatin  (LIPITOR) 10 MG tablet     Assessment & Plan Palpitations due to premature atrial and ventricular complexes and  non-sustained ventricular tachycardia related to sleep apnea Palpitations are attributed to premature atrial complexes (PACs) and premature ventricular complexes (PVCs), which are  benign and not life-threatening. Non-sustained ventricular tachycardia episodes occur at night and are related to obstructive sleep apnea. These arrhythmias are more noticeable at night and not with exertion activities.  The symptoms also occurring at rest suggest PACs and PVCs. - Ensure CPAP mask is properly fitting and oxygen saturation levels are adequate. - Discuss irregular heart rhythms with sleep doctor to consider any necessary CPAP setting adjustments.  Hypertension, not well controlled Hypertension is not well controlled with current medications (carvedilol  and spironolactone). Blood pressure readings have been elevated, necessitating additional management. Target blood pressure is 125/75 due to her young age and diabetes. - Start omisartan HCT, one pill once a day in the morning. - Check BMP in four weeks to monitor kidney function. - Follow up with primary care provider in four weeks to assess blood pressure management.  Type 2 diabetes mellitus Type 2 diabetes is being managed with Ozempic , but there is a need to evaluate the effectiveness of the current GLP-1 agonist therapy. Eating habits may be affecting the efficacy of the medication. Slow eating is encouraged to enhance the effectiveness of Ozempic . - Discuss with primary care provider the possibility of switching from Ozempic  to Zeppound if no weight loss is observed in one month. - Encourage slow eating habits to enhance the effectiveness of Ozempic .  Morbid obesity Morbid obesity is contributing to various health issues, including hypertension and sleep apnea. Weight loss is essential for overall health improvement. Emphasized the importance of slow eating and portion control to aid weight loss. Highlighted the benefits of weight loss at her age to  prevent sagging skin. - Encourage slow eating and portion control to aid weight loss. - Reassess weight loss progress in one month.  Obstructive sleep apnea, on CPAP Obstructive sleep apnea is being managed with CPAP therapy. Proper mask fit and CPAP settings are crucial to prevent arrhythmias related to sleep apnea. Non-sustained ventricular tachycardia episodes are related to sleep apnea. - Ensure regular follow-up with pulmonary doctor to monitor CPAP therapy and settings.  Hyperlipidemia Hyperlipidemia is present with an LDL level of 103, which is high for a diabetic patient. The target LDL level is 70. Initiating atorvastatin  therapy to manage cholesterol levels. - Start atorvastatin  10 mg once a day. - Follow up with primary care provider for lipid management and potential dose adjustment.   Signed,  Gordy Bergamo, MD, [facility] 06/27/2024, 9:56 AM [facility] [address] [place], [place] [address] Phone: [phone]. Fax:  [phone]

## 2024-06-27 NOTE — Patient Instructions (Addendum)
 Medication Instructions:  Start Atorvastatin  10 mg by mouth daily Start Olmesartan  hydrochlorothiazide  20/12.5 mg by mouth daily   *If you need a refill on your cardiac medications before your next appointment, please call your pharmacy*  Lab Work: none If you have labs (blood work) drawn today and your tests are completely normal, you will receive your results only by: MyChart Message (if you have MyChart) OR A paper copy in the mail If you have any lab test that is abnormal or we need to change your treatment, we will call you to review the results.  Testing/Procedures: none  Follow-Up: At [facility], you and your health needs are our priority.  As part of our continuing mission to provide you with exceptional heart care, our providers are all part of one team.  This team includes your primary Cardiologist (physician) and Advanced Practice Providers or APPs (Physician Assistants and Nurse Practitioners) who all work together to provide you with the care you need, when you need it.  Your next appointment:   As needed  Provider:   Dr Ladona    We recommend signing up for the patient portal called MyChart.  Sign up information is provided on this After Visit Summary.  MyChart is used to connect with patients for Virtual Visits (Telemedicine).  Patients are able to view lab/test results, encounter notes, upcoming appointments, etc.  Non-urgent messages can be sent to your provider as well.   To learn more about what you can do with MyChart, go to [email or web address].   Other Instructions

## 2024-07-05 ENCOUNTER — Other Ambulatory Visit: Payer: Self-pay | Admitting: Primary Care

## 2024-08-06 ENCOUNTER — Other Ambulatory Visit: Payer: Self-pay | Admitting: Internal Medicine

## 2024-08-08 ENCOUNTER — Ambulatory Visit (INDEPENDENT_AMBULATORY_CARE_PROVIDER_SITE_OTHER): Admitting: Family Medicine

## 2024-08-08 ENCOUNTER — Encounter: Payer: Self-pay | Admitting: Family Medicine

## 2024-08-08 VITALS — BP 120/82 | HR 88 | Temp 98.3°F | Ht 63.0 in | Wt 269.0 lb

## 2024-08-08 DIAGNOSIS — I129 Hypertensive chronic kidney disease with stage 1 through stage 4 chronic kidney disease, or unspecified chronic kidney disease: Secondary | ICD-10-CM

## 2024-08-08 DIAGNOSIS — Z23 Encounter for immunization: Secondary | ICD-10-CM | POA: Diagnosis not present

## 2024-08-08 DIAGNOSIS — E7800 Pure hypercholesterolemia, unspecified: Secondary | ICD-10-CM

## 2024-08-08 DIAGNOSIS — E66813 Obesity, class 3: Secondary | ICD-10-CM

## 2024-08-08 DIAGNOSIS — Z0000 Encounter for general adult medical examination without abnormal findings: Secondary | ICD-10-CM | POA: Diagnosis not present

## 2024-08-08 DIAGNOSIS — D649 Anemia, unspecified: Secondary | ICD-10-CM

## 2024-08-08 DIAGNOSIS — E785 Hyperlipidemia, unspecified: Secondary | ICD-10-CM

## 2024-08-08 DIAGNOSIS — E1122 Type 2 diabetes mellitus with diabetic chronic kidney disease: Secondary | ICD-10-CM

## 2024-08-08 DIAGNOSIS — E1169 Type 2 diabetes mellitus with other specified complication: Secondary | ICD-10-CM

## 2024-08-08 DIAGNOSIS — I10 Essential (primary) hypertension: Secondary | ICD-10-CM

## 2024-08-08 DIAGNOSIS — N182 Chronic kidney disease, stage 2 (mild): Secondary | ICD-10-CM | POA: Diagnosis not present

## 2024-08-08 DIAGNOSIS — Z6841 Body Mass Index (BMI) 40.0 and over, adult: Secondary | ICD-10-CM

## 2024-08-08 LAB — POCT URINALYSIS DIP (CLINITEK)
Bilirubin, UA: NEGATIVE
Blood, UA: NEGATIVE
Glucose, UA: NEGATIVE mg/dL
Ketones, POC UA: NEGATIVE mg/dL
Nitrite, UA: NEGATIVE
POC PROTEIN,UA: NEGATIVE
Spec Grav, UA: 1.015 (ref 1.010–1.025)
Urobilinogen, UA: 0.2 U/dL
pH, UA: 6 (ref 5.0–8.0)

## 2024-08-08 MED ORDER — OLMESARTAN MEDOXOMIL-HCTZ 20-12.5 MG PO TABS: 1.0000 | ORAL_TABLET | ORAL | 0 refills | Status: DC

## 2024-08-08 MED ORDER — OZEMPIC (2 MG/DOSE) 8 MG/3ML ~~LOC~~ SOPN: PEN_INJECTOR | SUBCUTANEOUS | 1 refills | Status: AC

## 2024-08-08 MED ORDER — ATORVASTATIN CALCIUM 10 MG PO TABS: 10.0000 mg | ORAL_TABLET | Freq: Every day | ORAL | 2 refills | Status: DC

## 2024-08-08 NOTE — Progress Notes (Unsigned)
 Kimberly,Jameka J Heath, CMA,acting as a Neurosurgeon for [employer], NP.,have documented all relevant documentation on the behalf of Kimberly Creighton, NP,as directed by  Kimberly Creighton, NP while in the presence of Kimberly Creighton, NP.  Subjective:    [name] , female    DOB: November 24, 1991 , 33 y.o.   MRN: [id number]  Chief Complaint  Patient presents with  . Annual Exam    Patient presents today for annual exam. She is followed by GYN: Dr Henry for her pelvic exams. She reports compliance with meds. Denies headache, chest pain, and SOB.    Patient declined pap smear today.    HPI Discussed the use of AI scribe software for clinical note transcription with the patient, who gave verbal consent to proceed.  History of Present Illness     [name] is a 33 year old female who presents for an annual physical exam.  She has a history of hypertension, sleep apnea, diabetes, and asthma. She uses a CPAP machine for sleep apnea. She experiences occasional palpitations, which have been attributed to her weight and sleep apnea. No current issues with shortness of breath or other symptoms.  Her diabetes is managed with Ozempic  2 mg, and she notes stable blood sugar readings. Her last A1c was 6.1, an increase from a previous 5.8. She was diagnosed with diabetes in February of the previous year.  She was prescribed cholesterol medication by her cardiologist about one to two months ago but stopped taking it a week ago after running out. She is unsure if she should continue it after her blood work.  For hypertension, she was prescribed olmesartan   hydrochlorothiazide  but stopped due to concerns about low blood pressure readings, which were consistently around 110/60, lower than her usual range of 130-140/80-100. She also uses carvedilol  and spironolactone.  She has a history of asthma and uses Singulair . She currently reports no shortness of breath.  She does not smoke or drink alcohol. She  reports swelling in her feet and ankles, which she attributes to prolonged sitting at work. No pain with bowel movements and reports regularity. She does not engage in regular exercise but acknowledges the need to incorporate walking into her routine. She recently had her eyes checked.  She has chronic kidney disease with slightly elevated creatinine and GFR 69levels    Hypertension This is a chronic problem. The current episode started more than 1 year ago. The problem has been gradually improving since onset. The problem is controlled. Pertinent negatives include no blurred vision, chest pain, headaches or neck pain. Risk factors for coronary artery disease include obesity. The current treatment provides moderate improvement.     Past Medical History:  Diagnosis Date  . Anxiety   . Asthma   . Back pain   . Chest pain   . Depression   . Diabetes mellitus without complication (HCC)   . Edema, lower extremity   . Fluid collection (edema) in the arms, legs, hands and feet   . Hypertension   . Pericardial effusion   . Sleep apnea   . SOB (shortness of breath)      Family History  Problem Relation Age of Onset  . Healthy Mother   . High blood pressure Father   . Sickle cell trait Father      Current Outpatient Medications:  .  albuterol  (VENTOLIN  HFA) 108 (90 Base) MCG/ACT inhaler, Inhale 2 puffs into the lungs every 6 (six) hours as needed for wheezing  or shortness of breath., Disp: 54 g, Rfl: 2 .  benzonatate  (TESSALON ) 100 MG capsule, Take 1 capsule (100 mg total) by mouth every 8 (eight) hours., Disp: 21 capsule, Rfl: 0 .  budesonide -formoterol  (SYMBICORT ) 80-4.5 MCG/ACT inhaler, Inhale 2 puffs into the lungs in the morning and at bedtime., Disp: 10.2 g, Rfl: 3 .  carvedilol  (COREG ) 25 MG tablet, Take 1 tablet (25 mg total) by mouth 2 (two) times daily with a meal., Disp: 180 tablet, Rfl: 1 .  Continuous Blood Gluc Receiver (FREESTYLE LIBRE 3 READER) DEVI, 1 applicator by Does  not apply route as directed., Disp: 1 each, Rfl: 1 .  Continuous Glucose Sensor (FREESTYLE LIBRE 3 SENSOR) MISC, Use as directed to check blood sugars, Disp: 2 each, Rfl: 3 .  cyclobenzaprine  (FLEXERIL ) 10 MG tablet, Take 1 tablet (10 mg total) by mouth 3 (three) times daily as needed for muscle spasms., Disp: 20 tablet, Rfl: 0 .  montelukast  (SINGULAIR ) 10 MG tablet, Take 1 tablet (10 mg total) by mouth daily., Disp: 90 tablet, Rfl: 3 .  norethindrone  (AYGESTIN ) 5 MG tablet, TAKE 2 TABLETS(10 MG) BY MOUTH EVERY DAY, Disp: 180 tablet, Rfl: 0 .  omeprazole  (PRILOSEC) 40 MG capsule, TAKE 1 CAPSULE(40 MG) BY MOUTH DAILY, Disp: 30 capsule, Rfl: 5 .  spironolactone (ALDACTONE) 25 MG tablet, Take 50 mg by mouth daily., Disp: , Rfl:  .  atorvastatin  (LIPITOR) 10 MG tablet, Take 1 tablet (10 mg total) by mouth daily., Disp: 30 tablet, Rfl: 2 .  olmesartan -hydrochlorothiazide  (BENICAR  HCT) 20-12.5 MG tablet, Take 1 tablet by mouth every morning., Disp: 90 tablet, Rfl: 0 .  Semaglutide , 2 MG/DOSE, (OZEMPIC , 2 MG/DOSE,) 8 MG/3ML SOPN, INJECT 2MG  UNDER THE SKIN ONCE A WEEK, Disp: 6 mL, Rfl: 1   Allergies  Allergen Reactions  . Mushroom Extract Complex (Obsolete) Other (See Comments)    Flu like symptoms        Social History   Tobacco Use  Smoking Status Never  . Passive exposure: Never  Smokeless Tobacco Never   Social History   Substance and Sexual Activity  Alcohol Use Yes   Comment: occasionally  .    Review of Systems  Constitutional: Negative.   HENT: Negative.    Eyes: Negative.  Negative for blurred vision.  Respiratory: Negative.    Cardiovascular: Negative.  Negative for chest pain.  Gastrointestinal: Negative.   Endocrine: Negative.   Genitourinary: Negative.   Musculoskeletal: Negative.  Negative for neck pain.  Skin: Negative.   Allergic/Immunologic: Negative.   Neurological: Negative.  Negative for headaches.  Hematological: Negative.   Psychiatric/Behavioral:  Negative.       Today's Vitals   08/08/24 1144  BP: 120/82  Pulse: 88  Temp: 98.3 F (36.8 C)  TempSrc: Oral  Weight: 269 lb (122 kg)  Height: 5' 3 (1.6 m)  PainSc: 0-No pain   Body mass index is 47.65 kg/m.  Wt Readings from Last 3 Encounters:  08/08/24 269 lb (122 kg)  06/27/24 263 lb 3.2 oz (119.4 kg)  03/05/24 264 lb 9.6 oz (120 kg)     Objective:  Physical Exam Constitutional:      Appearance: Normal appearance.  HENT:     Head: Normocephalic.  Cardiovascular:     Rate and Rhythm: Normal rate and regular rhythm.     Pulses: Normal pulses.     Heart sounds: Normal heart sounds.  Pulmonary:     Effort: Pulmonary effort is normal.     Breath  sounds: Normal breath sounds.  Abdominal:     General: Bowel sounds are normal.  Musculoskeletal:        General: Normal range of motion.  Skin:    General: Skin is warm and dry.  Neurological:     General: No focal deficit present.     Mental Status: She is alert and oriented to person, place, and time. Mental status is at baseline.  Psychiatric:        Mood and Affect: Mood normal.         Assessment And Plan:     Encounter for annual health examination  Essential hypertension, benign  Hypertensive nephropathy -     CBC -     CMP14+EGFR  Type 2 diabetes mellitus with morbid obesity (HCC) -     POCT URINALYSIS DIP (CLINITEK) -     Microalbumin / creatinine urine ratio -     Hemoglobin A1c  Chronic kidney disease, stage II (mild) -     Olmesartan  Medoxomil-HCTZ; Take 1 tablet by mouth every morning.  Dispense: 90 tablet; Refill: 0  Anemia, unspecified type  Hypercholesteremia -     Lipid panel -     Atorvastatin  Calcium ; Take 1 tablet (10 mg total) by mouth daily.  Dispense: 30 tablet; Refill: 2  Need for influenza vaccination -     Flu vaccine trivalent PF, 6mos and older(Flulaval,Afluria,Fluarix,Fluzone)  Class 3 severe obesity due to excess calories with serious comorbidity and body mass index  (BMI) of 45.0 to 49.9 in adult  Other orders -     Ozempic  (2 MG/DOSE); INJECT 2MG  UNDER THE SKIN ONCE A WEEK  Dispense: 6 mL; Refill: 1     Return in 14 weeks (on 11/14/2024) for 1 year physical, diabetes. Patient was given opportunity to ask questions. Patient verbalized understanding of the plan and was able to repeat key elements of the plan. All questions were answered to their satisfaction.   Kimberly Creighton, NP  Kimberly, Kimberly Creighton, NP, have reviewed all documentation for this visit. The documentation on 08/14/24 for the exam, diagnosis, procedures, and orders are all accurate and complete.

## 2024-08-08 NOTE — Patient Instructions (Signed)

## 2024-08-09 LAB — CMP14+EGFR
ALT: 32 IU/L (ref 0–32)
AST: 15 IU/L (ref 0–40)
Albumin: 4.1 g/dL (ref 3.9–4.9)
Alkaline Phosphatase: 62 IU/L (ref 44–121)
BUN/Creatinine Ratio: 9 (ref 9–23)
BUN: 10 mg/dL (ref 6–20)
Bilirubin Total: 0.2 mg/dL (ref 0.0–1.2)
CO2: 20 mmol/L (ref 20–29)
Calcium: 9.3 mg/dL (ref 8.7–10.2)
Chloride: 105 mmol/L (ref 96–106)
Creatinine, Ser: 1.09 mg/dL — ABNORMAL HIGH (ref 0.57–1.00)
Globulin, Total: 3.3 g/dL (ref 1.5–4.5)
Glucose: 193 mg/dL — ABNORMAL HIGH (ref 70–99)
Potassium: 4.5 mmol/L (ref 3.5–5.2)
Sodium: 139 mmol/L (ref 134–144)
Total Protein: 7.4 g/dL (ref 6.0–8.5)
eGFR: 69 mL/min/1.73 (ref >59)

## 2024-08-09 LAB — LIPID PANEL
Chol/HDL Ratio: 3.4 ratio (ref 0.0–4.4)
Cholesterol, Total: 156 mg/dL (ref 100–199)
HDL: 46 mg/dL (ref >39)
LDL Chol Calc (NIH): 100 mg/dL — ABNORMAL HIGH (ref 0–99)
Triglycerides: 45 mg/dL (ref 0–149)
VLDL Cholesterol Cal: 10 mg/dL (ref 5–40)

## 2024-08-09 LAB — CBC
Hematocrit: 37.1 % (ref 34.0–46.6)
Hemoglobin: 11.9 g/dL (ref 11.1–15.9)
MCH: 26.9 pg (ref 26.6–33.0)
MCHC: 32.1 g/dL (ref 31.5–35.7)
MCV: 84 fL (ref 79–97)
Platelets: 215 x10E3/uL (ref 150–450)
RBC: 4.43 x10E6/uL (ref 3.77–5.28)
RDW: 15 % (ref 11.7–15.4)
WBC: 4.4 x10E3/uL (ref 3.4–10.8)

## 2024-08-09 LAB — MICROALBUMIN / CREATININE URINE RATIO
Creatinine, Urine: 157.7 mg/dL
Microalb/Creat Ratio: 6 mg/g{creat} (ref 0–29)
Microalbumin, Urine: 10.2 ug/mL

## 2024-08-09 LAB — HEMOGLOBIN A1C
Est. average glucose Bld gHb Est-mCnc: 134 mg/dL
Hgb A1c MFr Bld: 6.3 % — ABNORMAL HIGH (ref 4.8–5.6)

## 2024-08-13 ENCOUNTER — Encounter: Payer: Self-pay | Admitting: Internal Medicine

## 2024-08-14 ENCOUNTER — Encounter: Payer: Self-pay | Admitting: Family Medicine

## 2024-08-14 DIAGNOSIS — E7800 Pure hypercholesterolemia, unspecified: Secondary | ICD-10-CM | POA: Insufficient documentation

## 2024-08-14 DIAGNOSIS — D649 Anemia, unspecified: Secondary | ICD-10-CM | POA: Insufficient documentation

## 2024-08-14 DIAGNOSIS — E1169 Type 2 diabetes mellitus with other specified complication: Secondary | ICD-10-CM | POA: Insufficient documentation

## 2024-08-14 DIAGNOSIS — Z23 Encounter for immunization: Secondary | ICD-10-CM | POA: Insufficient documentation

## 2024-08-15 ENCOUNTER — Ambulatory Visit: Payer: Self-pay | Admitting: Family Medicine

## 2024-08-15 NOTE — Progress Notes (Signed)
 Your A1c increase slightly to 6.3 from 6.1. Low carb diet and exercise advised and continue taking your meds as prescribed.your LDL is 100,goal is 70 or less. I sent Atorvastatin  10 mg every day to the pharmacy already.  Thanks!

## 2024-09-02 ENCOUNTER — Encounter: Payer: Self-pay | Admitting: Internal Medicine

## 2024-09-02 ENCOUNTER — Other Ambulatory Visit: Payer: Self-pay | Admitting: Internal Medicine

## 2024-09-02 DIAGNOSIS — E1169 Type 2 diabetes mellitus with other specified complication: Secondary | ICD-10-CM

## 2024-09-02 DIAGNOSIS — N182 Chronic kidney disease, stage 2 (mild): Secondary | ICD-10-CM

## 2024-09-02 DIAGNOSIS — I129 Hypertensive chronic kidney disease with stage 1 through stage 4 chronic kidney disease, or unspecified chronic kidney disease: Secondary | ICD-10-CM

## 2024-09-02 MED ORDER — ATORVASTATIN CALCIUM 10 MG PO TABS: 10.0000 mg | ORAL_TABLET | Freq: Every day | ORAL | 2 refills | Status: DC

## 2024-09-02 MED ORDER — OLMESARTAN MEDOXOMIL-HCTZ 20-12.5 MG PO TABS
1.0000 | ORAL_TABLET | ORAL | 2 refills | Status: DC
Start: 2024-09-02 — End: 2024-10-22

## 2024-09-03 ENCOUNTER — Other Ambulatory Visit: Payer: Self-pay | Admitting: Internal Medicine

## 2024-09-03 DIAGNOSIS — J984 Other disorders of lung: Secondary | ICD-10-CM

## 2024-09-03 DIAGNOSIS — R0602 Shortness of breath: Secondary | ICD-10-CM

## 2024-09-06 ENCOUNTER — Other Ambulatory Visit: Payer: Self-pay | Admitting: Family Medicine

## 2024-09-12 ENCOUNTER — Other Ambulatory Visit: Payer: Self-pay | Admitting: Internal Medicine

## 2024-09-12 DIAGNOSIS — E1169 Type 2 diabetes mellitus with other specified complication: Secondary | ICD-10-CM

## 2024-09-12 DIAGNOSIS — I129 Hypertensive chronic kidney disease with stage 1 through stage 4 chronic kidney disease, or unspecified chronic kidney disease: Secondary | ICD-10-CM

## 2024-09-12 DIAGNOSIS — N182 Chronic kidney disease, stage 2 (mild): Secondary | ICD-10-CM

## 2024-09-13 ENCOUNTER — Telehealth: Payer: Self-pay | Admitting: Pharmacist

## 2024-09-16 NOTE — Progress Notes (Signed)
 09/16/2024 Name: Kimberly Heath MRN: [id number] DOB: 1991-08-25  Chief Complaint  Patient presents with   Medication Assistance    Ozempic     Kimberly Heath is a 33 y.o. year old female who presented for a telephone visit.   They were referred to the pharmacist by their PCP for assistance in managing medication access.    Subjective:  Patient is a 33 year old female with multiple medical conditions including but not limited to:  type 2 diabetes, hypertension, hyperlipidemia, obstructive sleep apnea on CPAP, vitamin D  deficiency, and allergic rhinitis.  Patient just started a new job and will be uninsured for three months. PCP wondered if she could get Ozempic  through [employer]'s Program until she becomes insured on her new job.  Care Team: Primary Care Provider: Jarold Medici, MD     Medication Access/Adherence  Current Pharmacy:  [facility] [id number] [place], [place] - [address] AT [facility] [address] & [address] [place] [address] Phone: [phone] Fax: [phone]  [facility] [address] [place], [place] - [address] AT [address] [place] [address] Phone: [phone] Fax: [phone]  [id number] [place], [place] - [address] [place]  [address] Phone: [phone] Fax: [phone]   Patient reports affordability concerns with their medications: Yes  Currently uninsured Patient reports access/transportation concerns to their pharmacy: No  Patient reports adherence concerns with their medications:  Yes  Cost of Ozempic  while uninsured   Diabetes:  Current medications:   Ozempic  2 mg weekly  Current glucose readings: Does not use her Freestyle Libre sensors because having access to her blood sugars all day gave her anxiety     Patient denies hypoglycemic s/sx including  dizziness, shakiness, sweating.  Patient denies hyperglycemic symptoms including  polyuria, polydipsia, polyphagia, nocturia, neuropathy, blurred vision.   Macrovascular and Microvascular Risk Reduction:  Statin? yes (Atorvastatin  10 mg but not filled consistently due to muscle aches); ACEi/ARB? yes (Olmesartan /Hydrochlorothiazide  ) 20/12.5 #90 last filled 08/08/24 Last urinary albumin/creatinine ratio:  Lab Results  Component Value Date   MICRALBCREAT 6 08/08/2024   MICRALBCREAT 19 05/03/2024   MICRALBCREAT 18 08/08/2023   MICRALBCREAT 126 (H) 07/27/2022   MICRALBCREAT <30 09/01/2021   Last eye exam:  Lab Results  Component Value Date   HMDIABEYEEXA No Retinopathy 09/20/2023   Last foot exam: 08/08/2024 Tobacco Use:  Tobacco Use: Low Risk  (08/08/2024)   Patient History    Smoking Tobacco Use: Never    Smokeless Tobacco Use: Never    Passive Exposure: Never     Objective:  Lab Results  Component Value Date   HGBA1C 6.3 (H) 08/08/2024    Lab Results  Component Value Date   CREATININE 1.09 (H) 08/08/2024   BUN 10 08/08/2024   NA 139 08/08/2024   K 4.5 08/08/2024   CL 105 08/08/2024   CO2 20 08/08/2024    Lab Results  Component Value Date   CHOL 156 08/08/2024   HDL 46 08/08/2024   LDLCALC 100 (H) 08/08/2024   TRIG 45 08/08/2024   CHOLHDL 3.4 08/08/2024    Medications Reviewed Today     Reviewed by Jolee Cassius PARAS, RPH (Pharmacist) on 09/13/24 at 1655  Med List Status: <None>   Medication Order Taking? Sig Documenting Provider Last Dose Status Informant  albuterol  (VENTOLIN  HFA) 108 (90 Base) MCG/ACT inhaler [id number] Yes  Inhale 2 puffs into the lungs every 6 (six) hours as needed for wheezing or shortness of breath. Jarold Medici, MD  Active   atorvastatin  (LIPITOR) 10 MG tablet [id number]  Take 1 tablet (10 mg total) by mouth daily.  Patient not taking: Reported on 09/13/2024   Jarold Medici, MD  Active   benzonatate  (TESSALON ) 100 MG capsule [id number]  Take 1 capsule (100 mg total)  by mouth every 8 (eight) hours.  Patient not taking: Reported on 09/13/2024   Logan Ubaldo NOVAK, PA-C  Active   budesonide -formoterol  (SYMBICORT ) 80-4.5 MCG/ACT inhaler [id number] Yes INHALE 2 PUFFS INTO THE LUNGS IN THE MORNING AND AT BEDTIME Jarold Medici, MD  Active   carvedilol  (COREG ) 25 MG tablet [id number] Yes Take 1 tablet (25 mg total) by mouth 2 (two) times daily with a meal. Jarold Medici, MD  Active   Continuous Blood Gluc Receiver (FREESTYLE LIBRE 3 READER) DEVI [id number]  1 applicator by Does not apply route as directed.  Patient not taking: Reported on 09/13/2024   Jarold Medici, MD  Active   Continuous Glucose Sensor (FREESTYLE LIBRE 3 [place]) [place] [id number]  Use as directed to check blood sugars  Patient not taking: Reported on 09/13/2024   Jarold Medici, MD  Active   cyclobenzaprine  (FLEXERIL ) 10 MG tablet [id number] Yes Take 1 tablet (10 mg total) by mouth 3 (three) times daily as needed for muscle spasms. Geroldine Berg, MD  Active   Fluocinolone Acetonide Scalp 0.01 % OIL [id number] Yes Apply topically 2 (two) times daily. [provider]  Active   ketoconazole (NIZORAL) 2 % shampoo [id number] Yes Apply topically 3 (three) times a week. [provider]  Active   montelukast  (SINGULAIR ) 10 MG tablet [id number] Yes Take 1 tablet (10 mg total) by mouth daily. Hope Almarie ORN, NP  Active     Discontinued 09/13/24 1650 (Duplicate)   norethindrone  (GALLIFREY ) 5 MG tablet [id number] Yes Take 10 mg by mouth daily. [provider]  Active   olmesartan -hydrochlorothiazide  (BENICAR  HCT) 20-12.5 MG tablet [id number] Yes Take 1 tablet by mouth every morning. Jarold Medici, MD  Active   omeprazole  [facility]) 40 MG capsule [id number] Yes TAKE 1 CAPSULE(40 MG) BY MOUTH DAILY Hope Almarie ORN, NP  Active   Semaglutide , 2 MG/DOSE, (OZEMPIC , 2 MG/DOSE,) 8 MG/3ML SOPN [id number] Yes INJECT 2MG  UNDER THE SKIN ONCE A WEEK Petrina Pries, NP  Active   spironolactone  (ALDACTONE) 25 MG tablet [id number] Yes Take 50 mg by mouth daily. [provider]  Active               08/08/2024   11:44 AM 06/27/2024    9:10 AM 03/05/2024   12:04 PM  Vitals with BMI  Height 5' 3 5' 3 5' 3  Weight 269 lbs 263 lbs 3 oz 264 lbs 10 oz  BMI 47.66 46.64 46.88  Systolic 120 148 861  Diastolic 82 89 90  Pulse 88 93 86      Assessment/Plan:   Diabetes: - Currently controlled; goal A1c <7%. Cardiorenal risk reduction is opportunities for improvement.. Blood pressure is at goal <130/80. LDL is not at goal.  - Reviewed long term cardiovascular and renal outcomes of uncontrolled blood sugar., Reviewed goal A1c, goal fasting, and goal 2 hour post prandial glucose. Recommended to check glucose at least once daily with blood glucose meter, and will send patient assistance paperwork to her home.  Hyperlipidemia/ASCVD Risk Reduction: - Currently uncontrolled.  - Reviewed long term complications of uncontrolled  cholesterol - Recommend to switch statins to decrease muscle aches. For cost and to decrease muscle ache incidence, suggest Pravastatin 20-40 mg daily  - Meets financial criteria for Ozemic patient assistance program through [employer]. Will collaborate with provider, CPhT, and patient to pursue assistance.    Follow Up Plan:    Sent Patient information for RX Outreach so she can get her generic medications through their program if she would like.  She can get most of them for $30 for a 90 day supply which is much cheaper than local pharmacies.  Will message Dr. Jarold about switching to Pravastatin  Will route note over th RX Medication Assistance Team to begin the paperwork for Ozempic .  Will follow up with the Patient in 1 week.   Cassius DOROTHA Brought, PharmD, BCACP Clinical Pharmacist [phone]

## 2024-09-17 ENCOUNTER — Telehealth: Payer: Self-pay

## 2024-09-17 DIAGNOSIS — E1169 Type 2 diabetes mellitus with other specified complication: Secondary | ICD-10-CM

## 2024-09-17 NOTE — Telephone Encounter (Signed)
 PAP: Patient assistance application for Ozempic through [employer] has been mailed to pt's home address on file. Provider portion of application will be faxed to provider's office.

## 2024-09-18 ENCOUNTER — Encounter: Payer: Self-pay | Admitting: Pharmacist

## 2024-09-18 NOTE — Progress Notes (Signed)
   09/18/2024  [name], female   DOB: Jul 22, 1991, 33 y.o.   MRN: [id number]  Completed application, obtained provider signature and faxed [employer] application back to [employer]	[place], CPhT.  Cassius DOROTHA Brought, PharmD, BCACP Clinical Pharmacist [phone]

## 2024-09-19 ENCOUNTER — Other Ambulatory Visit (HOSPITAL_COMMUNITY): Payer: Self-pay

## 2024-09-19 NOTE — Telephone Encounter (Signed)
 Received provider portion [employer] (Ozempic )

## 2024-10-01 ENCOUNTER — Telehealth: Payer: Self-pay | Admitting: Pharmacist

## 2024-10-01 DIAGNOSIS — E1169 Type 2 diabetes mellitus with other specified complication: Secondary | ICD-10-CM

## 2024-10-01 DIAGNOSIS — I10 Essential (primary) hypertension: Secondary | ICD-10-CM

## 2024-10-01 MED ORDER — CARVEDILOL 25 MG PO TABS: 25.0000 mg | ORAL_TABLET | Freq: Two times a day (BID) | ORAL | 3 refills | Status: DC

## 2024-10-01 MED ORDER — SPIRONOLACTONE 50 MG PO TABS: 50.0000 mg | ORAL_TABLET | Freq: Every day | ORAL | 3 refills | Status: DC

## 2024-10-01 NOTE — Progress Notes (Signed)
 10/01/2024  [name], female   DOB: January 20, 1991, 33 y.o.   MRN: [id number]  Patient called to inquire about her patient assistance.  HIPAA identifiers were obtained. Patient said she did not receive the applications in the mail that were sent to her.  She requested that they be resent to her.  A message was sent to [name], CPhT to resent application.   Patient also requested refills on Carvedilol  and and Spironolactone.  Good RX coupons were found for both.  They are the cheapest at [facility]. Patient requested new prescriptions be sent to [facility].  Her medications were reivewed:  Medications Reviewed Today     Reviewed by Jolee Cassius PARAS, [facility] (Pharmacist) on 10/01/24 at 1601  Med List Status: <None>   Medication Order Taking? Sig Documenting Provider Last Dose Status Informant  albuterol  (VENTOLIN  HFA) 108 (90 Base) MCG/ACT inhaler [id number] Yes Inhale 2 puffs into the lungs every 6 (six) hours as needed for wheezing or shortness of breath. Jarold Medici, MD  Active   atorvastatin  (LIPITOR) 10 MG tablet [id number]  Take 1 tablet (10 mg total) by mouth daily.  Patient not taking: Reported on 10/01/2024   Jarold Medici, MD  Active   benzonatate  (TESSALON ) 100 MG capsule [id number] Yes Take 1 capsule (100 mg total) by mouth every 8 (eight) hours.  Patient taking differently: Take 100 mg by mouth 3 (three) times daily as needed.   Logan Ubaldo NOVAK, PA-C  Active   budesonide -formoterol  (SYMBICORT ) 80-4.5 MCG/ACT inhaler [id number]  INHALE 2 PUFFS INTO THE LUNGS IN THE MORNING AND AT BEDTIME Jarold Medici, MD  Active   carvedilol  (COREG ) 25 MG tablet [id number]  Take 1 tablet (25 mg total) by mouth 2 (two) times daily with a meal. Jarold Medici, MD  Active   Continuous Blood Gluc Receiver (FREESTYLE LIBRE 3 READER) DEVI [id number]  1 applicator by Does not apply route as directed.  Patient not taking: Reported on 09/13/2024   Jarold Medici, MD   Consider Medication Status and Discontinue (Patient Preference)    Patient not taking:   Discontinued 10/01/24 1600 (Patient Preference)   cyclobenzaprine  (FLEXERIL ) 10 MG tablet [id number] Yes Take 1 tablet (10 mg total) by mouth 3 (three) times daily as needed for muscle spasms. Geroldine Berg, MD  Active   Fluocinolone Acetonide Scalp 0.01 % OIL [id number] Yes Apply topically 2 (two) times daily. [provider]  Active   ketoconazole (NIZORAL) 2 % shampoo [id number] Yes Apply topically 3 (three) times a week. [provider]  Active   montelukast  (SINGULAIR ) 10 MG tablet [id number] Yes Take 1 tablet (10 mg total) by mouth daily. Hope Almarie ORN, NP  Active   norethindrone  (GALLIFREY ) 5 MG tablet [id number] Yes Take 10 mg by mouth daily. [provider]  Active   olmesartan -hydrochlorothiazide  (BENICAR  HCT) 20-12.5 MG tablet [id number]  Take 1 tablet by mouth every morning.  Patient not taking: Reported on 10/01/2024   Jarold Medici, MD  Active            Med Note MACK CASSIUS PARAS Sonjia Oct 01, 2024  4:01 PM) Did not get filled   omeprazole  (PRILOSEC) 40 MG capsule [id number] Yes TAKE 1 CAPSULE(40 MG) BY MOUTH DAILY Hope Almarie ORN, NP  Active   Semaglutide , 2 MG/DOSE, (OZEMPIC , 2 MG/DOSE,) 8 MG/3ML SOPN [id number] Yes INJECT 2MG  UNDER THE SKIN ONCE A WEEK Petrina Pries, NP  Active   spironolactone (ALDACTONE) 25 MG tablet  [id number]  Take 50 mg by mouth daily. [provider]  Active            Of note:  Patient said she has not been taking Olmesartan -HCTZ due to cost.  It was last filled 08/08/24.  She said she was choosing the medications she felt like would benefit her the most. She was reminded about the renoprotective qualities of olmesartan .  There is a drug-drug interaction with Olmesartan /HCTZ and Spironolactone.  The combination has been known to cause hyperkalemia. Last K- 4.5  Patient just started a new job and is currently uninsured.  Her new  insurance will start in January.     In addition, Patient reported not taking Atorvastatin  due to muscle aches. A note will be sent to the Provider about switching to Pravastatin.  Also, Patient expressed a need of Symbicort  but could not afford it due to her insurance situation.  There is no patient assistance program for Symbicort .  However if she were switched to Advair she could use a manufacturer coupon and get an inhaler for $35.  She was instructed to reach out to her Pulmonologist but a note will be sent to Dr Jarold as well.  Plan: Follow up with the Patient in 1 week.   Cassius DOROTHA Brought, PharmD, BCACP Clinical Pharmacist [phone]

## 2024-10-02 ENCOUNTER — Other Ambulatory Visit (HOSPITAL_COMMUNITY): Payer: Self-pay

## 2024-10-02 ENCOUNTER — Telehealth: Payer: Self-pay | Admitting: *Deleted

## 2024-10-02 ENCOUNTER — Telehealth: Payer: Self-pay

## 2024-10-02 MED ORDER — FLUTICASONE-SALMETEROL 250-50 MCG/ACT IN AEPB: 1.0000 | INHALATION_SPRAY | Freq: Two times a day (BID) | RESPIRATORY_TRACT | 11 refills | Status: AC

## 2024-10-02 MED ORDER — FLUTICASONE-SALMETEROL 250-50 MCG/ACT IN AEPB: 1.0000 | INHALATION_SPRAY | Freq: Two times a day (BID) | RESPIRATORY_TRACT | 11 refills | Status: DC

## 2024-10-02 NOTE — Telephone Encounter (Signed)
 ATC X1. I did leave a detailed vm so the pt would be informed, ok per DPR. NFN

## 2024-10-02 NOTE — Telephone Encounter (Signed)
 TCopied from CRM [id number]. Topic: Clinical - Prescription Issue >> Oct 02, 2024 11:57 AM Lavanda D wrote: Reason for CRM: Patient is returning a call regarding her order for fluticasone-salmeterol (ADVAIR) 250-50 MCG/ACT AEPB. She said that due to the price she would [id number] [place], [place] - [address]  Called patient and informed her that this has been sent in

## 2024-10-02 NOTE — Telephone Encounter (Signed)
 Copied from CRM [id number]. Topic: Clinical - Medication Question >> Oct 02, 2024 10:38 AM Russell PARAS wrote: Reason for CRM:   Pt is currently without insurance and is contacting the clinic regarding her prescription for Symbicort . She was advised by pharmacist to see if the provider could call in Advair which would be a cheaper alternative  Pt requesting call back, to speak with nurse/provider about Advair being called in, or another alternative med that would be cheaper for her.  CB#  [phone]  I called and spoke with the pt  She states that she got a new job and her insurance does not kick in until Dec 2025  She is needing inhaler called to pharm that will be less expensive than symbicort   Her pharmacist suggested advair  She understands she is overdue for appt and is willing to schedule something for after ins starts back  [place], please advise on inhaler, thanks!

## 2024-10-02 NOTE — Telephone Encounter (Signed)
 That is fine, I will send in Advair 250-50mcg one puffs q 12 hours

## 2024-10-19 ENCOUNTER — Other Ambulatory Visit: Payer: Self-pay | Admitting: Internal Medicine

## 2024-10-21 ENCOUNTER — Telehealth: Payer: Self-pay | Admitting: Pharmacist

## 2024-10-21 DIAGNOSIS — J4520 Mild intermittent asthma, uncomplicated: Secondary | ICD-10-CM

## 2024-10-21 DIAGNOSIS — N182 Chronic kidney disease, stage 2 (mild): Secondary | ICD-10-CM

## 2024-10-21 DIAGNOSIS — I129 Hypertensive chronic kidney disease with stage 1 through stage 4 chronic kidney disease, or unspecified chronic kidney disease: Secondary | ICD-10-CM

## 2024-10-22 MED ORDER — MONTELUKAST SODIUM 10 MG PO TABS: 10.0000 mg | ORAL_TABLET | Freq: Every day | ORAL | 3 refills | Status: AC

## 2024-10-22 MED ORDER — OMEPRAZOLE 40 MG PO CPDR: 40.0000 mg | DELAYED_RELEASE_CAPSULE | Freq: Every day | ORAL | 2 refills | Status: AC

## 2024-10-22 MED ORDER — OLMESARTAN MEDOXOMIL-HCTZ 20-12.5 MG PO TABS: 1.0000 | ORAL_TABLET | ORAL | 2 refills | Status: AC

## 2024-10-22 NOTE — Progress Notes (Signed)
   10/22/2024  [name], female   DOB: June 09, 1991, 33 y.o.   MRN: [id number]  Patient was called to follow up on medication management and adherence. HIPAA identifiers were obtained.  Patient will be uninsured until January due to her job insurance enrollment process.  She has been having medication affordability issues and has missed some of her medications.  She was mailed a patient assistance form for Ozempic  twice but she said she did not receive either of them.  She recently moved.  Her address was verified and a note was sent to Kimberly Heath, CPhT for her to send the Patient a new Ozempic  application.  Patient requested refills on Montelukast , Olmesartan -hydrochlorothiazide , and Omeprazole .  [facility] Rx [facility] of [facility] with a [facility] for [facility] to be used with the Patient's prescriptions.  Plan: Follow up with the Patient in 2 weeks.   Kimberly Heath, PharmD, BCACP Clinical Pharmacist [phone]

## 2024-11-07 ENCOUNTER — Telehealth: Payer: Self-pay | Admitting: Pharmacist

## 2024-11-07 DIAGNOSIS — E1169 Type 2 diabetes mellitus with other specified complication: Secondary | ICD-10-CM

## 2024-11-07 NOTE — Progress Notes (Unsigned)
 [name], female   DOB: [birth year], 33 y.o.   MRN: [id number]   Patient was called to follow up on medication adherence. HIPAA identifiers were obtained.  She confirmed picking up Carvedilol  25 mg and Spironolactone  50 mg but that she did not pick up the Olmesartan -HCTZ due to cost. She said it would have pushed her total to over $70.00  She will be uninsured until January of 2026.  She reported her blood pressures as ranging in the 120--139/80-85 range.  She wondered if she still needed Olmesartan -HCTZ.  She was educated about the renoprotective properties of Olmesartan  that are important for especially with her having diabetes.  Patient confirmed receiving the Patient Assistance Forms that were mailed to her home.  She had not completed them at the time of my call but said she would.  She has an upcoming appointment with Dr. Jarold on 12/12/23 and said she wanted to wait to discuss her blood pressure medications at that appointment.   She was able to restart Ozempic  and reported her blood sugars as: 129,150,136,208,213,139 (in the evenings)   Plan: Follow up with Patient after her upcoming appointment.  Cassius DOROTHA Brought, PharmD, BCACP Clinical Pharmacist [phone]

## 2024-11-11 ENCOUNTER — Ambulatory Visit: Payer: Self-pay | Admitting: Internal Medicine

## 2024-11-12 ENCOUNTER — Encounter: Payer: Self-pay | Admitting: Internal Medicine

## 2024-11-14 ENCOUNTER — Telehealth: Payer: Self-pay | Admitting: Internal Medicine

## 2024-11-14 NOTE — Telephone Encounter (Signed)
 Copied from CRM #[id number]. Topic: Clinical - Medication Question >> Nov 14, 2024  2:59 PM Kevelyn M wrote: Reason for CRM: Patient is calling to see if she can get more samples of the Ozempic ?  Call back# [phone]

## 2024-11-15 NOTE — Telephone Encounter (Signed)
 PAP: Application for Ozempic has been submitted to [employer], via fax

## 2024-11-19 NOTE — Telephone Encounter (Signed)
 PAP: Patient assistance application for Ozempic  has been approved by PAP Companies: [employer] from 11/18/2024 to 11/10/2025. Medication should be delivered to PAP Delivery: Provider's office. For further shipping updates, please contact [employer] at [phone]. Patient ID is: [id number]

## 2024-12-05 ENCOUNTER — Ambulatory Visit: Payer: Self-pay | Admitting: Pulmonary Disease

## 2024-12-11 ENCOUNTER — Ambulatory Visit: Payer: Self-pay | Admitting: Internal Medicine

## 2024-12-11 ENCOUNTER — Encounter: Payer: Self-pay | Admitting: Internal Medicine

## 2024-12-11 VITALS — BP 136/84 | HR 75 | Temp 98.3°F | Ht 63.0 in | Wt 273.4 lb

## 2024-12-11 DIAGNOSIS — N182 Chronic kidney disease, stage 2 (mild): Secondary | ICD-10-CM | POA: Diagnosis not present

## 2024-12-11 DIAGNOSIS — E785 Hyperlipidemia, unspecified: Secondary | ICD-10-CM

## 2024-12-11 DIAGNOSIS — E1169 Type 2 diabetes mellitus with other specified complication: Secondary | ICD-10-CM

## 2024-12-11 DIAGNOSIS — G4733 Obstructive sleep apnea (adult) (pediatric): Secondary | ICD-10-CM

## 2024-12-11 DIAGNOSIS — E1122 Type 2 diabetes mellitus with diabetic chronic kidney disease: Secondary | ICD-10-CM | POA: Diagnosis not present

## 2024-12-11 DIAGNOSIS — I129 Hypertensive chronic kidney disease with stage 1 through stage 4 chronic kidney disease, or unspecified chronic kidney disease: Secondary | ICD-10-CM | POA: Diagnosis not present

## 2024-12-11 DIAGNOSIS — I10 Essential (primary) hypertension: Secondary | ICD-10-CM

## 2024-12-11 MED ORDER — SPIRONOLACTONE 50 MG PO TABS: 50.0000 mg | ORAL_TABLET | Freq: Every day | ORAL | 3 refills | Status: AC

## 2024-12-11 MED ORDER — CARVEDILOL 25 MG PO TABS: 25.0000 mg | ORAL_TABLET | Freq: Two times a day (BID) | ORAL | 3 refills | Status: AC

## 2024-12-11 MED ORDER — ROSUVASTATIN CALCIUM 10 MG PO TABS: 10.0000 mg | ORAL_TABLET | Freq: Every day | ORAL | 11 refills | Status: AC

## 2024-12-11 MED ORDER — NORETHINDRONE ACETATE 5 MG PO TABS: 10.0000 mg | ORAL_TABLET | Freq: Every day | ORAL | 1 refills | Status: AC

## 2024-12-11 NOTE — Progress Notes (Signed)
 I,Victoria T Emmitt, CMA,acting as a neurosurgeon for Catheryn LOISE Slocumb, MD.,have documented all relevant documentation on the behalf of Catheryn LOISE Slocumb, MD,as directed by  Catheryn LOISE Slocumb, MD while in the presence of Catheryn LOISE Slocumb, MD.  Subjective:  [name] , female    DOB: [birth year] , 34 y.o.   MRN: [id number]  Chief Complaint  Patient presents with   Hypertension    Patient presents today for diabetes & blood pressure check. Patient reports compliance with medications. Denies headache, chest pain, and SOB.  She wants to discuss what cholesterol medication she is to take. She has not heard back from pharmacist about the change.  Letter sent to GYN for pap result.    Hyperlipidemia    HPI Discussed the use of AI scribe software for clinical note transcription with the patient, who gave verbal consent to proceed.  History of Present Illness Kimberly Heath is a 34 year old female with hypertension and diabetes who presents for a blood pressure and diabetes check.  She has been experiencing elevated blood sugar levels, particularly postprandial, and has not been consistently checking her blood sugar in the morning. She has been on Ozempic  0.5 mg for at least two months and recently received a prescription for 2 mg, but has not yet started it. She has experienced increased bowel movements since starting Ozempic . She drinks a lot of water and occasionally drinks diet soda or uses flavored water enhancers.  She has a history of hypertension and is currently taking carvedilol , spironolactone , and olmesartan . Her blood pressure readings have been fluctuating. She was able to obtain her medications even when she was out of insurance. She reports muscle soreness and is considering changing her cholesterol medication due to side effects.  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  She reports not engaging in any exercise and feels tired frequently, despite using a CPAP machine regularly. She has been taking multivitamins, zinc, and vitamin D3, but still feels tired.  She has a history of using a CPAP machine for sleep apnea and reports using it consistently throughout the night. Despite this, she feels the need to take a nap after work to feel refreshed. She has been incorporating more fiber into her diet, including beans and greens, but struggles with portion control.   Diabetes She presents for her follow-up diabetic visit. She has type 2 diabetes mellitus. Pertinent negatives for hypoglycemia include no headaches. Pertinent negatives for diabetes include no blurred vision and no chest pain. Risk factors for coronary artery disease include diabetes mellitus, obesity and sedentary lifestyle.  Hypertension This is a chronic problem. The current episode started more than 1 year ago. The problem has been gradually improving since onset. The problem is controlled. Pertinent negatives include no blurred vision, chest pain or headaches. Risk factors for coronary artery disease include obesity. The current treatment provides moderate improvement.     Past Medical History:  Diagnosis Date   Anxiety    Asthma    Back pain    Chest pain    Depression    Diabetes mellitus without complication (HCC)    Edema, lower extremity    Fluid collection (edema) in the arms, legs, hands and feet    Hypertension    Pericardial effusion    Sleep apnea    SOB (shortness of breath)      Family History  Problem Relation Age of Onset   Healthy Mother    High blood pressure Father    Sickle cell trait Father      Current Outpatient Medications:    albuterol  (VENTOLIN  HFA) 108 (90 Base) MCG/ACT inhaler, Inhale 2 puffs into the lungs every 6 (six) hours as needed for wheezing or shortness of breath., Disp: 54 g, Rfl: 2   benzonatate  (TESSALON ) 100 MG capsule, Take 1 capsule (100 mg  total) by mouth every 8 (eight) hours. (Patient taking differently: Take 100 mg by mouth 3 (three) times daily as needed.), Disp: 21 capsule, Rfl: 0   cyclobenzaprine  (FLEXERIL ) 10 MG tablet, Take 1 tablet (10 mg total) by mouth 3 (three) times daily as needed for muscle spasms., Disp: 20 tablet, Rfl: 0   Fluocinolone Acetonide Scalp 0.01 % OIL, Apply topically 2 (two) times daily., Disp: , Rfl:    fluticasone -salmeterol (ADVAIR) 250-50 MCG/ACT AEPB, Inhale 1 puff into the lungs every 12 (twelve) hours., Disp: 60 each, Rfl: 11   ketoconazole (NIZORAL) 2 % shampoo, Apply topically 3 (three) times a week., Disp: , Rfl:    montelukast  (SINGULAIR ) 10 MG tablet, Take 1 tablet (10 mg total) by mouth daily., Disp: 90 tablet, Rfl: 3   olmesartan -hydrochlorothiazide  (BENICAR  HCT) 20-12.5 MG tablet, Take 1 tablet by mouth every morning., Disp: 90 tablet, Rfl: 2   omeprazole  (PRILOSEC) 40 MG capsule, Take 1 capsule (40 mg total) by mouth daily., Disp: 90 capsule, Rfl: 2   rosuvastatin  (CRESTOR ) 10 MG tablet, Take 1 tablet (10 mg total) by mouth daily., Disp: 30 tablet, Rfl: 11   Semaglutide , 2 MG/DOSE, (OZEMPIC , 2 MG/DOSE,) 8 MG/3ML SOPN, INJECT 2MG  UNDER THE SKIN ONCE A WEEK, Disp: 6 mL, Rfl: 1   carvedilol  (COREG ) 25 MG tablet, Take 1 tablet (25 mg total) by mouth 2 (two) times daily with a meal., Disp: 180 tablet, Rfl: 3   Continuous Blood Gluc Receiver (FREESTYLE LIBRE 3 READER) DEVI, 1 applicator by Does not apply route as directed. (Patient not taking: Reported on 12/11/2024), Disp: 1 each, Rfl: 1   norethindrone  (GALLIFREY ) 5 MG tablet, Take 2 tablets (10 mg total) by mouth daily., Disp: 180 tablet, Rfl: 1   spironolactone  (ALDACTONE ) 50 MG tablet, Take 1 tablet (50 mg total) by mouth daily., Disp: 90 tablet, Rfl: 3   Allergies  Allergen Reactions   Mushroom Extract Complex (Obsolete) Other (See Comments)    Flu like symptoms      Review of Systems  Constitutional: Negative.   Eyes:  Negative for  blurred vision.  Respiratory: Negative.    Cardiovascular: Negative.  Negative for chest pain.  Gastrointestinal: Negative.   Neurological: Negative.  Negative for headaches.  Psychiatric/Behavioral: Negative.       Today's Vitals   12/11/24 1601  BP: 136/84  Pulse: 75  Temp: 98.3 F (36.8 C)  SpO2: 98%  Weight: 273 lb 6.4 oz (124 kg)  Height: 5' 3 (1.6 m)   Body mass index is 48.43 kg/m.  Wt Readings from Last 3 Encounters:  12/11/24 273 lb 6.4 oz (124 kg)  08/08/24 269 lb (122 kg)  06/27/24 263 lb 3.2 oz (119.4 kg)    The ASCVD Risk score (Arnett DK, et al., 2019) failed to calculate for the following reasons:   The 2019 ASCVD risk score is only valid for ages 71 to 5  Objective:  Physical Exam Vitals and nursing note reviewed.  Constitutional:      Appearance: Normal appearance. She is  obese.  HENT:     Head: Normocephalic and atraumatic.  Eyes:     Extraocular Movements: Extraocular movements intact.  Cardiovascular:     Rate and Rhythm: Normal rate and regular rhythm.     Heart sounds: Normal heart sounds.  Pulmonary:     Effort: Pulmonary effort is normal.     Breath sounds: Normal breath sounds.  Musculoskeletal:     Cervical back: Normal range of motion.  Skin:    General: Skin is warm.  Neurological:     General: No focal deficit present.     Mental Status: She is alert.  Psychiatric:        Mood and Affect: Mood normal.        Behavior: Behavior normal.         Assessment And Plan:   Assessment & Plan Hypertensive nephropathy Chronic, fair control. Goal BP <130/80.  Blood pressure fluctuating. Insurance issues resolved with affordable generic medications. - Continue carvedilol , spironolactone , and olmesartan . - Renewed prescriptions for carvedilol  and spironolactone  with a 90-day supply. - Ensured olmesartan  is taken regularly. - Follow low sodium diet.  Chronic kidney disease, stage II (mild) Chronic, she is encouraged to keep BP well  controlled and to stay well hydrated to decrease risk of CKD progression.   Hyperlipidemia associated with type 2 diabetes mellitus (HCC) Chronic, LDL goal is less than 70.  She will continue with rosuvastatin .  Type 2 diabetes mellitus with morbid obesity (HCC) BMI 48.  Elevated blood glucose levels with poor glycemic control due to high sugar intake and lack of exercise. Weight discrepancy noted. Artificial sweeteners may impact bowel movements and glycemic control. - Transition Ozempic  from 0.5 mg to 1 mg (36 clicks) for two weeks, then increase to 2 mg. - Encouraged regular exercise, such as walking. - Advised limiting soda intake to once a week and reducing artificial sweeteners. - Schedule follow-up in three months to reassess A1c and medication efficacy. OSA on CPAP Persistent fatigue despite consistent CPAP use may be related to elevated blood glucose levels. - Continue CPAP use throughout the night, minimum 4hrs/night  Orders Placed This Encounter  Procedures   CBC   CMP14+EGFR   Hemoglobin A1c   TSH   Vitamin B12   Return in 3 months (on 03/11/2025), or dm check.  Patient was given opportunity to ask questions. Patient verbalized understanding of the plan and was able to repeat key elements of the plan. All questions were answered to their satisfaction.    I, Catheryn LOISE Slocumb, MD, have reviewed all documentation for this visit. The documentation on 12/11/2024 for the exam, diagnosis, procedures, and orders are all accurate and complete.   IF YOU HAVE BEEN REFERRED TO A SPECIALIST, IT MAY TAKE 1-2 WEEKS TO SCHEDULE/PROCESS THE REFERRAL. IF YOU HAVE NOT HEARD FROM US /SPECIALIST IN TWO WEEKS, PLEASE GIVE US  A CALL AT [phone].

## 2024-12-11 NOTE — Patient Instructions (Signed)

## 2024-12-11 NOTE — Assessment & Plan Note (Addendum)
 Chronic, she is encouraged to keep BP well controlled and to stay well hydrated to decrease risk of CKD progression.

## 2024-12-12 LAB — CMP14+EGFR
ALT: 34 IU/L — ABNORMAL HIGH (ref 0–32)
AST: 20 IU/L (ref 0–40)
Albumin: 4.4 g/dL (ref 3.9–4.9)
Alkaline Phosphatase: 69 IU/L (ref 41–116)
BUN/Creatinine Ratio: 11 (ref 9–23)
BUN: 13 mg/dL (ref 6–20)
Bilirubin Total: 0.3 mg/dL (ref 0.0–1.2)
CO2: 22 mmol/L (ref 20–29)
Calcium: 9.3 mg/dL (ref 8.7–10.2)
Chloride: 102 mmol/L (ref 96–106)
Creatinine, Ser: 1.16 mg/dL — ABNORMAL HIGH (ref 0.57–1.00)
Globulin, Total: 3.2 g/dL (ref 1.5–4.5)
Glucose: 178 mg/dL — ABNORMAL HIGH (ref 70–99)
Potassium: 4.6 mmol/L (ref 3.5–5.2)
Sodium: 138 mmol/L (ref 134–144)
Total Protein: 7.6 g/dL (ref 6.0–8.5)
eGFR: 64 mL/min/1.73

## 2024-12-12 LAB — HEMOGLOBIN A1C
Est. average glucose Bld gHb Est-mCnc: 146 mg/dL
Hgb A1c MFr Bld: 6.7 % — ABNORMAL HIGH (ref 4.8–5.6)

## 2024-12-12 LAB — VITAMIN B12: Vitamin B-12: 712 pg/mL (ref 232–1245)

## 2024-12-12 LAB — CBC
Hematocrit: 39.1 % (ref 34.0–46.6)
Hemoglobin: 12.4 g/dL (ref 11.1–15.9)
MCH: 26.8 pg (ref 26.6–33.0)
MCHC: 31.7 g/dL (ref 31.5–35.7)
MCV: 84 fL (ref 79–97)
Platelets: 190 x10E3/uL (ref 150–450)
RBC: 4.63 x10E6/uL (ref 3.77–5.28)
RDW: 14.4 % (ref 11.7–15.4)
WBC: 4.3 x10E3/uL (ref 3.4–10.8)

## 2024-12-12 LAB — TSH: TSH: 1.87 u[IU]/mL (ref 0.450–4.500)

## 2024-12-15 ENCOUNTER — Ambulatory Visit: Payer: Self-pay | Admitting: Internal Medicine

## 2024-12-22 NOTE — Assessment & Plan Note (Signed)
 BMI 48.  Elevated blood glucose levels with poor glycemic control due to high sugar intake and lack of exercise. Weight discrepancy noted. Artificial sweeteners may impact bowel movements and glycemic control. - Transition Ozempic  from 0.5 mg to 1 mg (36 clicks) for two weeks, then increase to 2 mg. - Encouraged regular exercise, such as walking. - Advised limiting soda intake to once a week and reducing artificial sweeteners. - Schedule follow-up in three months to reassess A1c and medication efficacy.

## 2024-12-22 NOTE — Assessment & Plan Note (Signed)
 Chronic, fair control. Goal BP <130/80.  Blood pressure fluctuating. Insurance issues resolved with affordable generic medications. - Continue carvedilol , spironolactone , and olmesartan . - Renewed prescriptions for carvedilol  and spironolactone  with a 90-day supply. - Ensured olmesartan  is taken regularly. - Follow low sodium diet.

## 2024-12-22 NOTE — Assessment & Plan Note (Signed)
 Persistent fatigue despite consistent CPAP use may be related to elevated blood glucose levels. - Continue CPAP use throughout the night, minimum 4hrs/night

## 2024-12-22 NOTE — Assessment & Plan Note (Signed)
 Chronic, LDL goal is less than 70.  She will continue with rosuvastatin .

## 2025-01-03 ENCOUNTER — Ambulatory Visit: Payer: Self-pay | Admitting: Pulmonary Disease

## 2025-01-16 ENCOUNTER — Ambulatory Visit: Payer: Self-pay | Admitting: Pulmonary Disease

## 2025-03-12 ENCOUNTER — Ambulatory Visit: Payer: Self-pay | Admitting: Internal Medicine

## 2025-08-19 ENCOUNTER — Encounter: Payer: Self-pay | Admitting: Internal Medicine
